# Patient Record
Sex: Male | Born: 1955 | Race: White | Hispanic: No | Marital: Married | Smoking: Never smoker
Health system: Southern US, Community
[De-identification: ages and names within clinical notes are randomized; demographics above are authoritative.]

## PROBLEM LIST (undated history)

## (undated) DIAGNOSIS — Z87442 Personal history of urinary calculi: Secondary | ICD-10-CM

## (undated) DIAGNOSIS — G629 Polyneuropathy, unspecified: Secondary | ICD-10-CM

## (undated) DIAGNOSIS — M1990 Unspecified osteoarthritis, unspecified site: Secondary | ICD-10-CM

## (undated) DIAGNOSIS — L0291 Cutaneous abscess, unspecified: Secondary | ICD-10-CM

## (undated) DIAGNOSIS — I10 Essential (primary) hypertension: Secondary | ICD-10-CM

## (undated) DIAGNOSIS — I2510 Atherosclerotic heart disease of native coronary artery without angina pectoris: Secondary | ICD-10-CM

## (undated) DIAGNOSIS — F419 Anxiety disorder, unspecified: Secondary | ICD-10-CM

## (undated) DIAGNOSIS — K219 Gastro-esophageal reflux disease without esophagitis: Secondary | ICD-10-CM

## (undated) DIAGNOSIS — E782 Mixed hyperlipidemia: Secondary | ICD-10-CM

## (undated) DIAGNOSIS — M609 Myositis, unspecified: Secondary | ICD-10-CM

## (undated) DIAGNOSIS — G8929 Other chronic pain: Secondary | ICD-10-CM

## (undated) DIAGNOSIS — N200 Calculus of kidney: Secondary | ICD-10-CM

## (undated) DIAGNOSIS — Z87898 Personal history of other specified conditions: Secondary | ICD-10-CM

## (undated) DIAGNOSIS — M549 Dorsalgia, unspecified: Secondary | ICD-10-CM

## (undated) DIAGNOSIS — E109 Type 1 diabetes mellitus without complications: Secondary | ICD-10-CM

## (undated) HISTORY — DX: Atherosclerotic heart disease of native coronary artery without angina pectoris: I25.10

## (undated) HISTORY — DX: Type 1 diabetes mellitus without complications: E10.9

## (undated) HISTORY — PX: BACK SURGERY: SHX140

## (undated) HISTORY — PX: CARPAL TUNNEL RELEASE: SHX101

## (undated) HISTORY — PX: OTHER SURGICAL HISTORY: SHX169

## (undated) HISTORY — DX: Polyneuropathy, unspecified: G62.9

## (undated) HISTORY — DX: Mixed hyperlipidemia: E78.2

## (undated) HISTORY — DX: Anxiety disorder, unspecified: F41.9

## (undated) HISTORY — PX: ELBOW SURGERY: SHX618

## (undated) HISTORY — DX: Essential (primary) hypertension: I10

## (undated) HISTORY — DX: Personal history of other specified conditions: Z87.898

## (undated) HISTORY — DX: Myositis, unspecified: M60.9

---

## 2002-10-01 ENCOUNTER — Ambulatory Visit (HOSPITAL_COMMUNITY): Admission: RE | Admit: 2002-10-01 | Discharge: 2002-10-01 | Payer: Self-pay | Admitting: Family Medicine

## 2002-10-01 ENCOUNTER — Encounter: Payer: Self-pay | Admitting: Family Medicine

## 2002-10-23 ENCOUNTER — Ambulatory Visit (HOSPITAL_COMMUNITY): Admission: RE | Admit: 2002-10-23 | Discharge: 2002-10-23 | Payer: Self-pay | Admitting: Neurosurgery

## 2002-10-23 ENCOUNTER — Encounter: Payer: Self-pay | Admitting: Neurosurgery

## 2002-12-29 ENCOUNTER — Ambulatory Visit (HOSPITAL_COMMUNITY): Admission: RE | Admit: 2002-12-29 | Discharge: 2002-12-29 | Payer: Self-pay | Admitting: Neurosurgery

## 2003-08-27 ENCOUNTER — Ambulatory Visit (HOSPITAL_COMMUNITY): Admission: RE | Admit: 2003-08-27 | Discharge: 2003-08-27 | Payer: Self-pay | Admitting: Neurosurgery

## 2004-03-27 ENCOUNTER — Ambulatory Visit (HOSPITAL_COMMUNITY): Admission: RE | Admit: 2004-03-27 | Discharge: 2004-03-27 | Payer: Self-pay | Admitting: Internal Medicine

## 2005-04-06 ENCOUNTER — Ambulatory Visit (HOSPITAL_COMMUNITY): Admission: RE | Admit: 2005-04-06 | Discharge: 2005-04-06 | Payer: Self-pay | Admitting: Neurosurgery

## 2005-07-27 ENCOUNTER — Ambulatory Visit (HOSPITAL_COMMUNITY): Admission: RE | Admit: 2005-07-27 | Discharge: 2005-07-27 | Payer: Self-pay | Admitting: Family Medicine

## 2006-03-05 ENCOUNTER — Ambulatory Visit (HOSPITAL_COMMUNITY): Admission: RE | Admit: 2006-03-05 | Discharge: 2006-03-05 | Payer: Self-pay | Admitting: Neurosurgery

## 2006-07-31 ENCOUNTER — Encounter: Payer: Self-pay | Admitting: Cardiology

## 2007-06-10 ENCOUNTER — Ambulatory Visit (HOSPITAL_COMMUNITY): Admission: RE | Admit: 2007-06-10 | Discharge: 2007-06-10 | Payer: Self-pay | Admitting: General Surgery

## 2007-08-03 ENCOUNTER — Encounter: Admission: RE | Admit: 2007-08-03 | Discharge: 2007-08-03 | Payer: Self-pay | Admitting: Sports Medicine

## 2008-01-15 ENCOUNTER — Ambulatory Visit (HOSPITAL_BASED_OUTPATIENT_CLINIC_OR_DEPARTMENT_OTHER): Admission: RE | Admit: 2008-01-15 | Discharge: 2008-01-15 | Payer: Self-pay | Admitting: Orthopedic Surgery

## 2009-09-30 ENCOUNTER — Ambulatory Visit (HOSPITAL_BASED_OUTPATIENT_CLINIC_OR_DEPARTMENT_OTHER): Admission: RE | Admit: 2009-09-30 | Discharge: 2009-09-30 | Payer: Self-pay | Admitting: General Surgery

## 2010-01-30 ENCOUNTER — Encounter: Payer: Self-pay | Admitting: Sports Medicine

## 2010-03-15 ENCOUNTER — Ambulatory Visit: Payer: Self-pay | Admitting: Cardiovascular Disease

## 2010-03-23 LAB — GLUCOSE, CAPILLARY
Glucose-Capillary: 219 mg/dL — ABNORMAL HIGH (ref 70–99)
Glucose-Capillary: 224 mg/dL — ABNORMAL HIGH (ref 70–99)

## 2010-03-23 LAB — BASIC METABOLIC PANEL
Chloride: 106 mEq/L (ref 96–112)
GFR calc Af Amer: >60 mL/min (ref >60)
Potassium: 4.4 mEq/L (ref 3.5–5.1)
Sodium: 137 mEq/L (ref 135–145)

## 2010-03-23 LAB — POCT HEMOGLOBIN-HEMACUE: Hemoglobin: 14.6 g/dL (ref 13.0–17.0)

## 2010-03-23 LAB — ANAEROBIC CULTURE

## 2010-03-23 LAB — CULTURE, ROUTINE-ABSCESS

## 2010-04-14 ENCOUNTER — Encounter: Payer: Self-pay | Admitting: Cardiology

## 2010-04-19 ENCOUNTER — Encounter: Payer: Self-pay | Admitting: Cardiology

## 2010-04-19 ENCOUNTER — Ambulatory Visit (INDEPENDENT_AMBULATORY_CARE_PROVIDER_SITE_OTHER): Payer: BC Managed Care – PPO | Admitting: Cardiology

## 2010-04-19 DIAGNOSIS — R072 Precordial pain: Secondary | ICD-10-CM

## 2010-04-19 DIAGNOSIS — E785 Hyperlipidemia, unspecified: Secondary | ICD-10-CM

## 2010-04-19 DIAGNOSIS — E119 Type 2 diabetes mellitus without complications: Secondary | ICD-10-CM

## 2010-04-19 DIAGNOSIS — I10 Essential (primary) hypertension: Secondary | ICD-10-CM

## 2010-04-19 DIAGNOSIS — R079 Chest pain, unspecified: Secondary | ICD-10-CM

## 2010-04-19 NOTE — Patient Instructions (Signed)
Your physician has requested that you have en exercise stress myoview. For further information please visit [email or web address]. Please follow instruction sheet, as given.  Your physician recommends that you continue on your current medications as directed. Please refer to the Current Medication list given to you today.  Your physician recommends that you schedule a follow-up appointment in: 3 WEEKS

## 2010-04-24 LAB — GLUCOSE, CAPILLARY: Glucose-Capillary: 169 mg/dL — ABNORMAL HIGH (ref 70–99)

## 2010-04-24 LAB — BASIC METABOLIC PANEL
BUN: 12 mg/dL (ref 6–23)
CO2: 25 mEq/L (ref 19–32)
Glucose, Bld: 186 mg/dL — ABNORMAL HIGH (ref 70–99)
Potassium: 4.3 mEq/L (ref 3.5–5.1)
Sodium: 138 mEq/L (ref 135–145)

## 2010-04-27 ENCOUNTER — Ambulatory Visit (HOSPITAL_COMMUNITY): Payer: BC Managed Care – PPO | Attending: Cardiology | Admitting: Radiology

## 2010-04-27 VITALS — Ht 72.0 in | Wt 258.0 lb

## 2010-04-27 DIAGNOSIS — E119 Type 2 diabetes mellitus without complications: Secondary | ICD-10-CM

## 2010-04-27 DIAGNOSIS — R072 Precordial pain: Secondary | ICD-10-CM | POA: Insufficient documentation

## 2010-04-27 DIAGNOSIS — R0789 Other chest pain: Secondary | ICD-10-CM

## 2010-04-27 DIAGNOSIS — R079 Chest pain, unspecified: Secondary | ICD-10-CM

## 2010-04-27 MED ORDER — TECHNETIUM TC 99M TETROFOSMIN IV KIT
33.0000 | PACK | Freq: Once | INTRAVENOUS | Status: AC | PRN
  Administered 2010-04-27: 33 via INTRAVENOUS

## 2010-04-27 MED ORDER — TECHNETIUM TC 99M TETROFOSMIN IV KIT
10.8000 | PACK | Freq: Once | INTRAVENOUS | Status: AC | PRN
  Administered 2010-04-27: 11 via INTRAVENOUS

## 2010-04-27 NOTE — Progress Notes (Signed)
[facility] SITE 3 NUCLEAR MED [address] [place] [address] [phone]  Cardiology Nuclear Med Study  David Mcdowell is a 55 y.o. male [id number] 04/12/1955   Nuclear Med Background Indication for Stress Test:  Evaluation for Ischemia History:  10 years ago GXT Cardiac Risk Factors: Family History - CAD, Hypertension,, IDDM Type 2 and Lipids  Symptoms:  Chest Pain, Chest Tightness with Exertion (last date of chest discomfort yesterday), DOE and Fatigue   Nuclear Pre-Procedure Caffeine/Decaff Intake:  None NPO After: 8:30pm   Lungs:  clear IV 0.9% NS with Angio Cath:  22g  IV Site: R Hand  IV Started by:  Doyne Keel, CNMT  Chest Size (in):  46 Cup Size: n/a  Height: 6' (1.829 m)  Weight:  258 lb (117.028 kg)  BMI:  Body mass index is 34.99 kg/(m^2). Tech Comments:  Held metformin this am    Nuclear Med Study 1 or 2 day study: 1 day  Stress Test Type:  Stress  Reading MD: Olga Millers, MD  Order Authorizing Provider:  Ermalene Postin, MD  Resting Radionuclide: Technetium 86m Tetrofosmin  Resting Radionuclide Dose: 10.8 mCi   Stress Radionuclide:  Technetium 10m Tetrofosmin  Stress Radionuclide Dose: 33.0 mCi           Stress Protocol Rest HR: 89 Stress HR: 151  Rest BP: 147/74 Stress BP: 189/59  Exercise Time (min): 8:9mins METS: 8.90     % Max HR: 90.42 bpm    Dose of Adenosine (mg):  n/a Dose of Lexiscan: n/a mg  Dose of Atropine (mg): n/a Dose of Dobutamine: n/a mcg/kg/min (at max HR)  Stress Test Technologist: Frederick Peers, EMT-P  Nuclear Technologist:  Doyne Keel, CNMT     Rest Procedure:  Myocardial perfusion imaging was performed at rest 45 minutes following the intravenous administration of Technetium 62m Tetrofosmin. Rest ECG: NSR  Stress Procedure:  The patient exercised for 8:69min.  The patient stopped due to SOB/leg fatigue and denied any chest pain.  There were no significant ST-T wave changes.  Technetium 35m Tetrofosmin  was injected at peak exercise and myocardial perfusion imaging was performed after a brief delay. Stress ECG: No significant ST segment change suggestive of ischemia.  QPS Raw Data Images:  Acquisition technically good; normal left ventricular size. Stress Images:  Normal homogeneous uptake in all areas of the myocardium. Rest Images:  Normal homogeneous uptake in all areas of the myocardium. Subtraction (SDS):  No evidence of ischemia. Transient Ischemic Dilatation (Normal <1.22):  0.91 Lung/Heart Ratio (Normal <0.45):  0.34  Quantitative Gated Spect Images QGS EDV:  85 ml QGS ESV:  36 ml QGS cine images:  Normal Wall Motion QGS EF: 58%  Impression Exercise Capacity:  Fair exercise capacity. BP Response:  Normal blood pressure response. Clinical Symptoms:  No chest pain. ECG Impression:  No significant ST segment change suggestive of ischemia. Comparison with Prior Nuclear Study: No images to compare  Overall Impression:  Normal stress nuclear study.    Olga Millers

## 2010-04-28 NOTE — Progress Notes (Signed)
Routed to Dr.Stuckey.Mirna Mires

## 2010-05-01 ENCOUNTER — Encounter (HOSPITAL_COMMUNITY): Payer: Self-pay | Admitting: Cardiology

## 2010-05-03 NOTE — Progress Notes (Signed)
Pt aware of results by phone.  Pt has upcoming appointment 05/11/10 with Dr Riley Kill.

## 2010-05-04 DIAGNOSIS — E785 Hyperlipidemia, unspecified: Secondary | ICD-10-CM | POA: Insufficient documentation

## 2010-05-04 DIAGNOSIS — I10 Essential (primary) hypertension: Secondary | ICD-10-CM | POA: Insufficient documentation

## 2010-05-04 DIAGNOSIS — R072 Precordial pain: Secondary | ICD-10-CM | POA: Insufficient documentation

## 2010-05-04 DIAGNOSIS — E1140 Type 2 diabetes mellitus with diabetic neuropathy, unspecified: Secondary | ICD-10-CM | POA: Insufficient documentation

## 2010-05-04 NOTE — Assessment & Plan Note (Addendum)
His hypertension is currently controlled on medical therapy.  Today it is elevated, but in fact it has been followed by Dr. Cherylin Mylar again, he is appropriately on an ACE inhibitor.

## 2010-05-04 NOTE — Assessment & Plan Note (Signed)
Is currently on a generic statin as is appropriate given his DM.

## 2010-05-04 NOTE — Progress Notes (Signed)
HPI:  David Mcdowell is a pleasant gentleman of 55 years of age referred by Dr. Phillips Odor.  He knows Chief Financial Officer.  He has a history of 24-25 years of diabetes mellitus, and is on insulin.  The patient has a neuropathy, and hurts all of the time.  He has had a sharp pain in the center of his chest, which is knife like and not associated with shortness of breath.  It can occur after but not with stress. It does occur mostly after exertion.  Nortably, he notes a dull pain down the left arm.  These symptoms have occurred for about 20-30 minutes at a time.  He does hurt in some way all of the time. He uses an insulin pump.  When he walks on hard surfaces, he will get some hand pain.  Most of the symptoms have started within the last thirty days.  He sees Dr. Lurene Shadow for endocrinology. His father died at age 21 with a cardiac event, with first MIs earlier on in the course of his events.  Current Outpatient Prescriptions  Medication Sig Dispense Refill  . ALPRAZolam (XANAX) 0.5 MG tablet Take 0.5 mg by mouth 2 (two) times daily.        Marland Kitchen amoxicillin-clavulanate (AUGMENTIN) 875-125 MG per tablet Take 1 tablet by mouth 2 (two) times daily. Prn for flare       . aspirin 81 MG tablet Take 81 mg by mouth daily.        . cyclobenzaprine (FLEXERIL) 10 MG tablet Take 10 mg by mouth 3 (three) times daily as needed.        . diazepam (VALIUM) 5 MG tablet Take 5 mg by mouth every 6 (six) hours as needed.        Marland Kitchen HYDROcodone-acetaminophen (VICODIN) 5-500 MG per tablet Take 1 tablet by mouth every 6 (six) hours as needed.        . INSULIN REGULAR HUMAN (HUMULIN R) 500 UNIT/ML SOLN Inject into the skin. Sliding scale       . lisinopril (PRINIVIL,ZESTRIL) 40 MG tablet Take 40 mg by mouth daily.        . metFORMIN (GLUCOPHAGE) 1000 MG tablet Take 1,000 mg by mouth daily.        . nabumetone (RELAFEN) 500 MG tablet Take 500 mg by mouth as needed.        . simvastatin (ZOCOR) 40 MG tablet Take 40 mg by mouth at bedtime.        .  ciprofloxacin (CIPRO) 500 MG tablet Take 500 mg by mouth. Prn for flare         No Known Allergies  Past Medical History  Diagnosis Date  . Diabetes mellitus     type II  . Hypertension   . Chest tightness   . Hyperlipidemia   . Neuropathy   . Myositis   . Anxiety     Past Surgical History  Procedure Date  . Carpal tunnel release   . Back surgery     Family History  Problem Relation Age of Onset  . Coronary artery disease      family hx of    History   Social History  . Marital Status: Married    Spouse Name: N/A    Number of Children: N/A  . Years of Education: N/A   Occupational History  . Not on file.   Social History Main Topics  . Smoking status: Never Smoker   . Smokeless tobacco: Not on file  .  Alcohol Use: No  . Drug Use: No  . Sexually Active: Not on file   Other Topics Concern  . Not on file   Social History Narrative  . No narrative on file    ROS: No current issues with hearing, visual.  No GI complaints.  Complete ROS otherwise negative.   PHYSICAL EXAM:  BP 153/91  Pulse 88  Ht 6' (1.829 m)  Wt 259 lb 14.4 oz (117.89 kg)  BMI 35.25 kg/m2  General: Well developed, well nourished, in no acute distress. Head:  Normocephalic and atraumatic. Neck: no JVD Lungs: Clear to auscultation and percussion. Heart: Normal S1 and S2.  No murmur, rubs or gallops.  Abdomen:  Normal bowel sounds; soft; non tender; no organomegaly Pulses: Pulses normal in all 4 extremities. Extremities: No clubbing or cyanosis. No edema. Neurologic: Alert and oriented x 3.  EKG:  NSR. WNL.  ASSESSMENT AND PLAN:

## 2010-05-04 NOTE — Assessment & Plan Note (Addendum)
Patient has symptoms with central pain, but it is atypical to some extent in nature.  He may in fact require cath, but I think at this point it seems reasonable to do a stress myoview to assess exercise tolerance, and myocardial perfusion in light of results of Bari 2D.  If it is high risk, we might consider a diagnostic cath study.  Should he have any progression before then, he should contact us promptly.  Given his long standing diabetes requiring insulin, he likely has coronary atherosclerosis.  This may or may not be source of symptoms.  We would also eventually need a CXR.

## 2010-05-04 NOTE — Assessment & Plan Note (Signed)
Has an insulin pump and is nicely controlled at the present time.  He does not smoke, and seems to take his disease seriously. Has neuropathy by history.

## 2010-05-11 ENCOUNTER — Ambulatory Visit (INDEPENDENT_AMBULATORY_CARE_PROVIDER_SITE_OTHER): Payer: BC Managed Care – PPO | Admitting: Cardiology

## 2010-05-11 ENCOUNTER — Encounter: Payer: Self-pay | Admitting: Cardiology

## 2010-05-11 DIAGNOSIS — R079 Chest pain, unspecified: Secondary | ICD-10-CM

## 2010-05-11 DIAGNOSIS — E785 Hyperlipidemia, unspecified: Secondary | ICD-10-CM

## 2010-05-11 NOTE — Patient Instructions (Addendum)
Your physician recommends that you schedule a follow-up appointment in: 6 WEEKS  Your physician recommends that you continue on your current medications as directed. Please refer to the Current Medication list given to you today.  

## 2010-05-11 NOTE — Progress Notes (Signed)
HPI:  Patient came in today to review his stress test.  He had some mod shortness of breath with 8 minutes.  There was no perfusion defect, and no obvious ischemia.  There was not ST depression.  He is under tremendous stress as his daughter in law is living with them, and moving out today to separate from the son who is in the [employer] in [place].  The patient has walked a bit more, and we discussed this at length.    Current Outpatient Prescriptions  Medication Sig Dispense Refill  . ALPRAZolam (XANAX) 0.5 MG tablet Take 0.5 mg by mouth at bedtime as needed.       Marland Kitchen amoxicillin-clavulanate (AUGMENTIN) 875-125 MG per tablet Take 1 tablet by mouth 2 (two) times daily. Prn for flare       . aspirin 81 MG tablet Take 81 mg by mouth daily.        . ciprofloxacin (CIPRO) 500 MG tablet Take 500 mg by mouth. Prn for flare       . cyclobenzaprine (FLEXERIL) 10 MG tablet Take 10 mg by mouth 3 (three) times daily as needed.        . diazepam (VALIUM) 5 MG tablet Take 5 mg by mouth every 6 (six) hours as needed.        Marland Kitchen HYDROcodone-acetaminophen (VICODIN) 5-500 MG per tablet Take 1 tablet by mouth every 6 (six) hours as needed.        . INSULIN REGULAR HUMAN (HUMULIN R) 500 UNIT/ML SOLN Inject into the skin. Sliding scale       . lisinopril (PRINIVIL,ZESTRIL) 40 MG tablet Take 40 mg by mouth daily.        . metFORMIN (GLUCOPHAGE) 1000 MG tablet Take 1,000 mg by mouth daily.        . nabumetone (RELAFEN) 500 MG tablet Take 500 mg by mouth as needed.        . simvastatin (ZOCOR) 40 MG tablet Take 40 mg by mouth at bedtime.          No Known Allergies  Past Medical History  Diagnosis Date  . Diabetes mellitus     type II  . Hypertension   . Chest tightness   . Hyperlipidemia   . Neuropathy   . Myositis   . Anxiety     Past Surgical History  Procedure Date  . Carpal tunnel release   . Back surgery     Family History  Problem Relation Age of Onset  . Coronary artery disease      family hx  of    History   Social History  . Marital Status: Married    Spouse Name: N/A    Number of Children: N/A  . Years of Education: N/A   Occupational History  . Not on file.   Social History Main Topics  . Smoking status: Never Smoker   . Smokeless tobacco: Not on file  . Alcohol Use: No  . Drug Use: No  . Sexually Active: Not on file   Other Topics Concern  . Not on file   Social History Narrative  . No narrative on file    ROS: Please see the HPI.  All other systems reviewed and negative.  PHYSICAL EXAM:  BP 140/80  Pulse 96  Resp 18  Ht 6' (1.829 m)  Wt 259 lb 1.9 oz (117.536 kg)  BMI 35.14 kg/m2  General: Well developed, well nourished, in no acute distress. Head:  Normocephalic and  atraumatic. Neck: no JVD Lungs: Clear to auscultation and percussion. Heart: Normal S1 and S2.  No murmur, rubs or gallops.  Pulses: Pulses normal in all 4 extremities. Extremities: No clubbing or cyanosis. No edema. Neurologic: Alert and oriented x 3.  EKG:  See stress test report.    ASSESSMENT AND PLAN:

## 2010-05-11 NOTE — Assessment & Plan Note (Signed)
See stress report. We discussed this situation in detail.  He will exercise in a structured fashion.  He will see if his exercise tolerance improves.  We will have a low threshold for recommending heart catheterization if any progression of symptoms.

## 2010-05-11 NOTE — Assessment & Plan Note (Signed)
Discussed aiming toward targets in LDL and HgbA1c.

## 2010-05-23 NOTE — Op Note (Signed)
David Mcdowell, David Mcdowell               ACCOUNT NO.:  [id number]   MEDICAL RECORD NO.:  [id number]          PATIENT TYPE:  AMB   LOCATION:  DSC                          FACILITY:  [facility]   PHYSICIAN:  Loreta Ave, M.D. DATE OF BIRTH:  08/25/1955   DATE OF PROCEDURE:  01/15/2008  DATE OF DISCHARGE:                               OPERATIVE REPORT   PREOPERATIVE DIAGNOSIS:  Lateral epicondylitis with tearing of extensor  carpi radialis brevis tendon left elbow.   POSTOPERATIVE DIAGNOSIS:  Lateral epicondylitis with tearing of extensor  carpi radialis brevis tendon left elbow.   PROCEDURE:  Left elbow exploration and debridement of the extensor carpi  radialis brevis tendon.  Epicondylar debridement and drilling.  Repair  of superficial extensors elbow defect.   SURGEON:  Loreta Ave, MD   ASSISTANT:  Zonia Kief, PA   ANESTHESIA:  General.   BLOOD LOSS:  Minimal.   SPECIMEN:  None.   CULTURES:  None.   COMPLICATIONS:  None.   PROCEDURE:  Soft compressive with a sugar-tong splint.   TOURNIQUET TIME:  45 minutes.   PROCEDURE:  The patient was brought to the operating room and after  adequate anesthesia had been obtained, left elbow examined.  Full motion  and stable elbow.  Tourniquet applied and prepped and draped in the  usual sterile fashion.  Exsanguination and elevation with an Esmarch  tourniquet inflated to 250 mmHg.  Incision from the epicondyle distally.  Skin and subcutaneous tissue divided.  Superficial extensors intact.  Divided longitudinally.  Mucinous degeneration and partial tearing of  the ECRB tendon from the epicondyle down almost to the radial head.  Underlying capsule, abnormal tissue and tendon all debrided back to  healthy tissue.  The elbow exposed and examined.  No chondromalacia.  Epicondyle debrided free with multiple drilling.  Wound irrigated.  Superficial extensors were then brought back over top of the defect and  over the epicondyle and  sutured with a running Vicryl  suture.  Wound irrigated and closed with subcutaneous and subcuticular  Vicryl.  Sterile compressive dressing applied.  Tourniquet deflated and  removed.  Sugar-tong splint applied.  Anesthesia reversed.  Brought to  the recovery room.  Tolerated the surgery well.  No complications.      Loreta Ave, M.D.  Electronically Signed     DFM/MEDQ  D:  01/15/2008  T:  01/16/2008  Job:  [id number]

## 2010-05-26 NOTE — Op Note (Signed)
NAME:  David Mcdowell, David Mcdowell                         ACCOUNT NO.:  [id number]   MEDICAL RECORD NO.:  [id number]                   PATIENT TYPE:  OIB   LOCATION:  3172                                 FACILITY:  [facility]   PHYSICIAN:  Hilda Lias, M.D.                DATE OF BIRTH:  09-Nov-1955   DATE OF PROCEDURE:  12/29/2002  DATE OF DISCHARGE:  12/29/2002                                 OPERATIVE REPORT   PREOPERATIVE DIAGNOSIS:  Right carpal tunnel syndrome.   POSTOPERATIVE DIAGNOSIS:  Right carpal tunnel syndrome.   OPERATION PERFORMED:  Decompression of the right median nerve.   SURGEON:  Hilda Lias, M.D.   ANESTHESIA:  IV plus local.   INDICATIONS FOR PROCEDURE:  [name] underwent decompression of the left  median nerve several weeks ago.  Now he feels that the right hand is getting  worse.  Nerve conduction velocity was positive for carpal tunnel syndrome.  The risks were explained including infection, no improvement whatsoever,  need for further surgery.   DESCRIPTION OF PROCEDURE:  The patient was taken to the operating room and  the right hand and arm was prepped with Betadine.  Drapes were applied.  Infiltration along the base of the thumb was made.  Then incision from this  base of the thumb was made through the skin, volar ligament and through a  thick carpal ligament.  Decompression proximal and distal was done along the  ulnar aspect of the nerve.  At the end we had good decompression with plenty  of space. Resection of part of the carpal ligament was also achieved.  Then  the area was irrigated.  Hemostasis was done with bipolar.  The patient was  able to move the fingers including the thumb.  From then on the wound was  closed with nylon.                                               Hilda Lias, M.D.    EB/MEDQ  D:  12/29/2002  T:  12/29/2002  Job:  [id number]

## 2010-05-26 NOTE — Procedures (Signed)
NAME:  DEMBA, NIGH               ACCOUNT NO.:  [id number]   MEDICAL RECORD NO.:  [id number]          PATIENT TYPE:  OUT   LOCATION:  RESP                          FACILITY:  [facility]   PHYSICIAN:  Edward L. Juanetta Gosling, M.D.DATE OF BIRTH:  1955/09/25   DATE OF PROCEDURE:  07/27/2005  DATE OF DISCHARGE:  07/27/2005                              PULMONARY FUNCTION TEST   Spirometry shows a mild ventilatory defect without definite airflow  obstruction except at the level of the smaller airways.  This could indicate  mild asthma or other mild air flow obstructive disease.      Edward L. Juanetta Gosling, M.D.  Electronically Signed     ELH/MEDQ  D:  07/31/2005  T:  08/01/2005  Job:  [id number]

## 2010-05-26 NOTE — H&P (Signed)
NAME:  David Mcdowell, David Mcdowell               ACCOUNT NO.:  [id number]   MEDICAL RECORD NO.:  [id number]           PATIENT TYPE:  AMB   LOCATION:  DAY                           FACILITY:  [facility]   PHYSICIAN:  Dalia Heading, M.D.  DATE OF BIRTH:  1955/05/03   DATE OF ADMISSION:  DATE OF DISCHARGE:  LH                              HISTORY & PHYSICAL   CHIEF COMPLAINT:  Need for screening colonoscopy.   HISTORY OF PRESENT ILLNESS:  The patient is a 55 year old white male who  is referred for an endoscopic evaluation.  He needs a colonoscopy for  screening purposes.  No abdominal pain, weight loss, nausea, vomiting,  diarrhea, constipation, melena, or hematochezia have been noted.  He has  never had a colonoscopy.  There is no family history of colon carcinoma.   PAST MEDICAL HISTORY:  Includes insulin-dependent diabetes mellitus,  hypertension.   PAST SURGICAL HISTORY:  Carpal tunnel release, back surgery.   CURRENT MEDICATIONS:  NovoLog, Zocor, Vicodin, Prinivil, Prilosec,  Relafen.   ALLERGIES:  No known drug allergies.   REVIEW OF SYSTEMS:  Noncontributory.   On physical examination, the patient is a well-developed, well-nourished  white male in no acute distress.  LUNGS:  Clear to auscultation with equal breath sounds bilaterally.  HEART:  Examination reveals regular rate and rhythm without S3, S4, or  murmurs.  ABDOMEN:  Soft, nontender, nondistended.  No hepatosplenomegaly or  masses are noted.  RECTAL:  Deferred to the procedure.   IMPRESSION:  Need for screening colonoscopy.   PLAN:  The patient is scheduled for a colonoscopy on June 10, 2007.  The  risks and benefits of the procedure, including bleeding and perforation,  were fully explained to the patient, gave informed consent.      Dalia Heading, M.D.  Electronically Signed     MAJ/MEDQ  D:  05/13/2007  T:  05/14/2007  Job:  [id number]   cc:   Patrica Duel, M.D.  Fax: [phone]

## 2010-05-26 NOTE — Op Note (Signed)
   NAME:  David Mcdowell, David Mcdowell                         ACCOUNT NO.:  [id number]   MEDICAL RECORD NO.:  [id number]                   PATIENT TYPE:  OIB   LOCATION:  2895                                 FACILITY:  [facility]   PHYSICIAN:  Hilda Lias, M.D.                DATE OF BIRTH:  04-13-1955   DATE OF PROCEDURE:  10/23/2002  DATE OF DISCHARGE:  10/23/2002                                 OPERATIVE REPORT   PREOPERATIVE DIAGNOSIS:  Bilateral carpal tunnel syndrome.   POSTOPERATIVE DIAGNOSIS:  Bilateral carpal tunnel syndrome.   PROCEDURE:  Decompression of the left median nerve.   SURGEON:  Hilda Lias, M.D.   ANESTHESIA:  IV sedation plus local.   CLINICAL HISTORY:  The patient was admitted because of pain of both hands  associated with weakness of both thenar muscles.  Nerve conduction  velocities show severe carpal tunnel syndrome, left worse than the right  one.  The patient wanted to go ahead with the procedure.  He knew about the  risks such as infection, no improvement whatsoever, scar tissue, bleeding,  need for further surgery.   DESCRIPTION OF PROCEDURE:  The patient was taken to the OR and after IV  sedation, the hand was prepped with Betadine.  Infiltration with Xylocaine  1% was made.  Then incision along the base of the thumb was made through the  skin, volar ligament, and through a thick carpal ligament.  Decompression  proximal along the ulnar aspect of the nerve was achieved.  Decompression  distally was done.  Hemostasis was done with bipolar.  We found that the  carpal ligament was thick and calcified.  Having good decompression, the  area was irrigated, hemostasis was done with the bipolar, and the wound was  closed using nylon.  The patient did well and at the end of the procedure  was able to move all the fingers, including the thumb.                                               Hilda Lias, M.D.    EB/MEDQ  D:  10/23/2002  T:  10/24/2002  Job:   [id number]

## 2010-05-26 NOTE — Op Note (Signed)
NAME:  David Mcdowell, David Mcdowell                         ACCOUNT NO.:  [id number]   MEDICAL RECORD NO.:  [id number]                   PATIENT TYPE:  OIB   LOCATION:  2899                                 FACILITY:  [facility]   PHYSICIAN:  Hilda Lias, M.D.                DATE OF BIRTH:  [birth year]   DATE OF PROCEDURE:  08/27/2003  DATE OF DISCHARGE:                                 OPERATIVE REPORT   PREOPERATIVE DIAGNOSIS:  Recurrent right carpal tunnel syndrome.   POSTOPERATIVE DIAGNOSIS:  Recurrent right carpal tunnel syndrome.   PROCEDURE:  Decompression of the right median nerve, lysis of adhesions,  infiltration of the subcutaneous tissue with Solu-Medrol.   SURGEON:  Hilda Lias, M.D.   CLINICAL HISTORY:  [name] is a gentleman who underwent bilateral carpal  tunnel syndrome surgery.  Lately the patient has been complaining of pain in  both hands, right worse than the left one.  Tests showed that indeed he had  recurrent carpal tunnel syndrome in the right side.  By palpation he has a  thick, hard subcutaneous space.  Surgery was advised to decompress the  median nerve.  The risks, of course, were recurrence of the compression of  the median nerve, infection.   PROCEDURE:  The patient was taken to the OR, and the right hand was prepped  with Betadine.  Infiltration with Xylocaine was made.  IV sedation was  given.  Incision for the previous one was made through the skin through a  thick scar tissue.  We were able to see the median nerve, which was distally  at the level of the wrist, which was clean, but from then on it was adherent  to the subcutaneous tissue.  We followed the median nerve all the way  distally, doing lysis of adhesions to decompress the median nerve  completely.  Decompression proximally and distally was achieved.  Hemostasis  was done with bipolar.  The area was irrigated and infiltration of the  subcutaneous tissue with Solu-Medrol was made.  Having done this,  the wound  was closed with nylon.                                               Hilda Lias, M.D.    EB/MEDQ  D:  08/27/2003  T:  08/28/2003  Job:  [id number]

## 2010-06-29 ENCOUNTER — Encounter: Payer: Self-pay | Admitting: Cardiology

## 2010-06-29 ENCOUNTER — Ambulatory Visit (INDEPENDENT_AMBULATORY_CARE_PROVIDER_SITE_OTHER): Payer: Medicare Other | Admitting: Cardiology

## 2010-06-29 DIAGNOSIS — R079 Chest pain, unspecified: Secondary | ICD-10-CM

## 2010-06-29 DIAGNOSIS — I10 Essential (primary) hypertension: Secondary | ICD-10-CM

## 2010-06-29 NOTE — Patient Instructions (Signed)
Your physician recommends that you schedule a follow-up appointment in: 2 months with Dr. Riley Kill.

## 2010-07-10 ENCOUNTER — Encounter: Payer: Self-pay | Admitting: Cardiology

## 2010-07-10 NOTE — Progress Notes (Signed)
HPI:  His disability came through and he feels as though a tremendous stress has been lifted off of his shoulders.  Denies any chest pain.  He does have some ortho issues which limit some of his exercise, but seems to be doing better.  No major symptoms at this point in time.    Current Outpatient Prescriptions  Medication Sig Dispense Refill  . ALPRAZolam (XANAX) 0.5 MG tablet Take 0.5 mg by mouth at bedtime as needed.       Marland Kitchen amoxicillin-clavulanate (AUGMENTIN) 875-125 MG per tablet Take 1 tablet by mouth 2 (two) times daily. Prn for flare       . aspirin 81 MG tablet Take 81 mg by mouth daily.        . ciprofloxacin (CIPRO) 500 MG tablet Take 500 mg by mouth. Prn for flare       . cyclobenzaprine (FLEXERIL) 10 MG tablet Take 10 mg by mouth 3 (three) times daily as needed.        . diazepam (VALIUM) 5 MG tablet Take 5 mg by mouth every 6 (six) hours as needed.        Marland Kitchen HYDROcodone-acetaminophen (VICODIN) 5-500 MG per tablet Take 1 tablet by mouth every 6 (six) hours as needed.        . INSULIN REGULAR HUMAN (HUMULIN R) 500 UNIT/ML SOLN Inject into the skin. Sliding scale       . lisinopril (PRINIVIL,ZESTRIL) 40 MG tablet Take 40 mg by mouth daily.        . metFORMIN (GLUCOPHAGE) 1000 MG tablet Take 1,000 mg by mouth daily.        . nabumetone (RELAFEN) 500 MG tablet Take 500 mg by mouth as needed.        . simvastatin (ZOCOR) 40 MG tablet Take 40 mg by mouth at bedtime.          No Known Allergies  Past Medical History  Diagnosis Date  . Diabetes mellitus     type II  . Hypertension   . Chest tightness   . Hyperlipidemia   . Neuropathy   . Myositis   . Anxiety     Past Surgical History  Procedure Date  . Carpal tunnel release   . Back surgery     Family History  Problem Relation Age of Onset  . Coronary artery disease      family hx of    History   Social History  . Marital Status: Married    Spouse Name: N/A    Number of Children: N/A  . Years of Education: N/A    Occupational History  . Not on file.   Social History Main Topics  . Smoking status: Never Smoker   . Smokeless tobacco: Not on file  . Alcohol Use: No  . Drug Use: No  . Sexually Active: Not on file   Other Topics Concern  . Not on file   Social History Narrative  . No narrative on file    ROS: Please see the HPI.  All other systems reviewed and negative.  PHYSICAL EXAM:  BP 143/91  Pulse 105  Resp 18  Ht 6' (1.829 m)  Wt 264 lb 1.9 oz (119.804 kg)  BMI 35.82 kg/m2  General: Well developed, well nourished, in no acute distress. Head:  Normocephalic and atraumatic. Neck: no JVD Lungs: Clear to auscultation and percussion. Heart: Normal S1 and S2.  No murmur, rubs or gallops.  Pulses: Pulses normal in all 4 extremities.  Extremities: No clubbing or cyanosis. No edema. Neurologic: Alert and oriented x 3.  EKG:  ASSESSMENT AND PLAN:

## 2010-07-10 NOTE — Assessment & Plan Note (Signed)
He has not had much chest pain.  There are a lot of stressors in his life, and his disability was a big thing.  He appears and sounds relieved.  We have extensively reviewed diabetic CAD data, and he understands to report any new symptoms.  We will continue to follow him over time.

## 2010-07-10 NOTE — Assessment & Plan Note (Signed)
He remains under control.   Again today his current numbers are borderline, but Dr.Golding is following, and doing a nice job.  Appropriately on ACE inhibition.

## 2010-08-21 ENCOUNTER — Telehealth: Payer: Self-pay | Admitting: *Deleted

## 2010-08-21 ENCOUNTER — Encounter: Payer: Self-pay | Admitting: Cardiology

## 2010-08-21 ENCOUNTER — Ambulatory Visit (INDEPENDENT_AMBULATORY_CARE_PROVIDER_SITE_OTHER): Payer: Medicare Other | Admitting: Cardiology

## 2010-08-21 VITALS — BP 179/86 | HR 102 | Ht 72.0 in | Wt 267.0 lb

## 2010-08-21 DIAGNOSIS — I10 Essential (primary) hypertension: Secondary | ICD-10-CM

## 2010-08-21 DIAGNOSIS — R079 Chest pain, unspecified: Secondary | ICD-10-CM

## 2010-08-21 DIAGNOSIS — E785 Hyperlipidemia, unspecified: Secondary | ICD-10-CM

## 2010-08-21 NOTE — Progress Notes (Signed)
HPI:  Patient is having brief, sharp chest pain.  It lasts seconds.  He is also having severe arm pain related to a tendon tear, and his BP is up quite a bit.  This has been bothering him greatly and he is going to check with Dr. Margaretha Sheffield today.  He also is having some indigestion.  No progression.  He said his BP was down the other day. Current Outpatient Prescriptions  Medication Sig Dispense Refill  . ALPRAZolam (XANAX) 0.5 MG tablet Take 0.5 mg by mouth at bedtime as needed.       Marland Kitchen aspirin 81 MG tablet Take 81 mg by mouth daily.        . diazepam (VALIUM) 5 MG tablet Take 5 mg by mouth every 6 (six) hours as needed.        Marland Kitchen HYDROcodone-acetaminophen (VICODIN) 5-500 MG per tablet Take 1 tablet by mouth every 6 (six) hours as needed.        . INSULIN REGULAR HUMAN (HUMULIN R) 500 UNIT/ML SOLN Inject into the skin. Sliding scale       . lisinopril (PRINIVIL,ZESTRIL) 40 MG tablet Take 40 mg by mouth daily.        . metFORMIN (GLUCOPHAGE) 1000 MG tablet Take 1,000 mg by mouth daily.        . nabumetone (RELAFEN) 500 MG tablet Take 500 mg by mouth as needed.        . simvastatin (ZOCOR) 40 MG tablet Take 40 mg by mouth at bedtime.        Marland Kitchen zolpidem (AMBIEN) 10 MG tablet Take 10 mg by mouth at bedtime as needed.        Marland Kitchen amoxicillin-clavulanate (AUGMENTIN) 875-125 MG per tablet Take 1 tablet by mouth 2 (two) times daily. Prn for flare       . ciprofloxacin (CIPRO) 500 MG tablet Take 500 mg by mouth. Prn for flare       . cyclobenzaprine (FLEXERIL) 10 MG tablet Take 10 mg by mouth 3 (three) times daily as needed.          No Known Allergies  Past Medical History  Diagnosis Date  . Diabetes mellitus     type II  . Hypertension   . Chest tightness   . Hyperlipidemia   . Neuropathy   . Myositis   . Anxiety     Past Surgical History  Procedure Date  . Carpal tunnel release   . Back surgery     Family History  Problem Relation Age of Onset  . Coronary artery disease      family hx of      History   Social History  . Marital Status: Married    Spouse Name: N/A    Number of Children: N/A  . Years of Education: N/A   Occupational History  . Not on file.   Social History Main Topics  . Smoking status: Never Smoker   . Smokeless tobacco: Not on file  . Alcohol Use: No  . Drug Use: No  . Sexually Active: Not on file   Other Topics Concern  . Not on file   Social History Narrative  . No narrative on file    ROS: Please see the HPI.  All other systems reviewed and negative.  PHYSICAL EXAM:  BP 179/86  Pulse 102  Ht 6' (1.829 m)  Wt 267 lb (121.11 kg)  BMI 36.21 kg/m2  General: Well developed, well nourished, in no acute distress. Head:  Normocephalic and atraumatic. Neck: no JVD Lungs: Clear to auscultation and percussion. Heart: Normal S1 and S2.  No murmur, rubs or gallops.  Abdomen:  Normal bowel sounds; soft; non tender; no organomegaly Pulses: Pulses normal in all 4 extremities. Extremities: No clubbing or cyanosis. No edema. Neurologic: Alert and oriented x 3.  EKG:  NSR.  Nonspecific T flattening.   ASSESSMENT AND PLAN:

## 2010-08-21 NOTE — Patient Instructions (Signed)
Your physician recommends that you schedule a follow-up appointment in: 3 weeks with DR [facility] Your physician recommends that you continue on your current medications as directed. Please refer to the Current Medication list given to you today.

## 2010-08-21 NOTE — Telephone Encounter (Signed)
Received call from pt requesting prescription for blood pressure cuff be sent to [facility] in [place]. He was instructed by Dr. Riley Kill at office visit today to monitor blood pressure at home.  I told pt I would call it in for him. I called the order for blood pressure cuff with directions to monitor daily to [facility] in [place].

## 2010-08-22 ENCOUNTER — Other Ambulatory Visit: Payer: Self-pay | Admitting: Sports Medicine

## 2010-08-22 DIAGNOSIS — M25521 Pain in right elbow: Secondary | ICD-10-CM

## 2010-08-24 NOTE — Assessment & Plan Note (Signed)
I discussed his BP with him in detail  He says it goes up with pain of any type.  He has a partial ligament tear, and got a shot for pain, and is going back.  I strongly encouraged him to buy a BP cuff, record pressures, and allow [place] to make treatment decisions based upon this data instead of assuming things.

## 2010-08-24 NOTE — Assessment & Plan Note (Signed)
His pain is clearly atypical.  It is sharp, lasts a split second.  Nothing lasts at all.  However, his likelihood of CAD is extremely high.  The current symptoms likely are not related to CAD, but as noted, there is a high likelihood that CAD is present.  He is to call if his symptoms change.  We will see him back in three weeks, and have a very low threshold for consideration of angiographic evaluation.

## 2010-08-24 NOTE — Assessment & Plan Note (Signed)
Curently followed by Dr. Phillips Odor.  Patient is on a statin.

## 2010-08-27 ENCOUNTER — Ambulatory Visit
Admission: RE | Admit: 2010-08-27 | Discharge: 2010-08-27 | Disposition: A | Discharge status: Home / Self Care | Payer: Medicare Other | Source: Ambulatory Visit | Attending: Sports Medicine | Admitting: Sports Medicine

## 2010-08-27 DIAGNOSIS — M25521 Pain in right elbow: Secondary | ICD-10-CM

## 2010-08-31 ENCOUNTER — Ambulatory Visit (HOSPITAL_BASED_OUTPATIENT_CLINIC_OR_DEPARTMENT_OTHER)
Admission: RE | Admit: 2010-08-31 | Discharge: 2010-08-31 | Disposition: A | Discharge status: Home / Self Care | Payer: Medicare Other | Source: Ambulatory Visit | Attending: Orthopedic Surgery | Admitting: Orthopedic Surgery

## 2010-08-31 DIAGNOSIS — S53499A Other sprain of unspecified elbow, initial encounter: Secondary | ICD-10-CM | POA: Insufficient documentation

## 2010-08-31 DIAGNOSIS — S53146A Lateral dislocation of unspecified ulnohumeral joint, initial encounter: Secondary | ICD-10-CM | POA: Insufficient documentation

## 2010-08-31 DIAGNOSIS — X58XXXA Exposure to other specified factors, initial encounter: Secondary | ICD-10-CM | POA: Insufficient documentation

## 2010-08-31 DIAGNOSIS — Y929 Unspecified place or not applicable: Secondary | ICD-10-CM | POA: Insufficient documentation

## 2010-08-31 LAB — POCT I-STAT, CHEM 8
BUN: 17 mg/dL (ref 6–23)
Calcium, Ion: 1.22 mmol/L (ref 1.12–1.32)
Chloride: 105 mEq/L (ref 96–112)
HCT: 50 % (ref 39.0–52.0)
Potassium: 4.6 mEq/L (ref 3.5–5.1)
Sodium: 141 mEq/L (ref 135–145)

## 2010-08-31 LAB — GLUCOSE, CAPILLARY
Glucose-Capillary: 207 mg/dL — ABNORMAL HIGH (ref 70–99)
Glucose-Capillary: 173 mg/dL — ABNORMAL HIGH (ref 70–99)

## 2010-09-13 ENCOUNTER — Ambulatory Visit (INDEPENDENT_AMBULATORY_CARE_PROVIDER_SITE_OTHER): Payer: Medicare Other | Admitting: Cardiology

## 2010-09-13 ENCOUNTER — Encounter: Payer: Self-pay | Admitting: Cardiology

## 2010-09-13 VITALS — BP 140/80 | HR 112 | Ht 72.0 in | Wt 263.0 lb

## 2010-09-13 DIAGNOSIS — R079 Chest pain, unspecified: Secondary | ICD-10-CM

## 2010-09-13 DIAGNOSIS — I10 Essential (primary) hypertension: Secondary | ICD-10-CM

## 2010-09-13 DIAGNOSIS — E785 Hyperlipidemia, unspecified: Secondary | ICD-10-CM

## 2010-09-13 MED ORDER — AMLODIPINE BESYLATE 5 MG PO TABS: 5.0000 mg | ORAL_TABLET | Freq: Every day | ORAL | Status: DC

## 2010-09-13 NOTE — Patient Instructions (Signed)
Your physician wants you to follow-up in:  6 months. You will receive a reminder letter in the mail two months in advance. If you don't receive a letter, please call our office to schedule the follow-up appointment.   

## 2010-09-19 NOTE — Op Note (Signed)
  [name], David Mcdowell NO.:  [id number]  MEDICAL RECORD NO.:  [id number]  LOCATION:                                 FACILITY:  PHYSICIAN:  Loreta Ave, M.D. DATE OF BIRTH:  1955-10-17  DATE OF PROCEDURE:  08/31/2010 DATE OF DISCHARGE:                              OPERATIVE REPORT   PREOPERATIVE DIAGNOSIS:  Traumatic evulsion, common extensor tendon, lateral aspect, right elbow.  POSTOPERATIVE DIAGNOSIS:  Traumatic evulsion, common extensor tendon, lateral aspect, right elbow.  PROCEDURE:  Right elbow lateral exploration with primary repair of common extensor tendon to the epicondyle with a 3-mm bioabsorbable anchor and FiberWire suture.  SURGEON:  Loreta Ave, MD  ASSISTANT:  Laural Benes. Su Hilt, [place], present throughout the entire case.  ANESTHESIA:  General.  ESTIMATED BLOOD LOSS:  Minimal.  SPECIMEN:  None.  COUNTS:  None.  COMPLICATIONS:  None.  DRESSING:  Soft compressive, long-arm splint.  DESCRIPTION OF PROCEDURE:  The patient was brought to the operating room and placed on the operating table in supine position.  After adequate anesthesia had been obtained, elbow examined.  Full motion stable level. Tourniquet applied.  Prepped and draped in usual sterile fashion. Exsanguinated with elevation and Esmarch.  Tourniquet inflated to 250 mmHg.  Straight incision from the epicondyle distally.  Skin and subcutaneous tissue divided.  Common extensor tendon torn off the epicondyle almost entirely from anterior to posterior.  Retracted 4-5 mm.  This was debrided.  Mobilized for repair.  Some portions of the ECRB were still intact below.  Epicondyle debrided, denuded, and a 3-mm anchor placed right in the middle of the epicondyle under fluoroscopic guidance.  The debrided mobilized common extensor tendon was then primarily repaired back down to the epicondyle with running FiberWire suture down and then back up and then firmly sutured in place.   A second suture from the anchor was used to oversew this on top.  Nice firm repair reattachment confirmed.  Wound irrigated and closed with subcutaneous and subcuticular Vicryl.  Margins were injected Marcaine. Sterile compressive dressing applied.  Tourniquet deflated and removed. Long-arm splint applied.  Anesthesia reversed.  Brought to recovery room.  Tolerated surgery well.  No complications.     Loreta Ave, M.D.     DFM/MEDQ  D:  08/31/2010  T:  08/31/2010  Job:  [id number]  Electronically Signed by Mckinley Jewel M.D. on 09/19/2010 02:46:57 PM

## 2010-10-11 NOTE — Assessment & Plan Note (Signed)
He is on simvastatin and this is being checked locally.  May need to switch.

## 2010-10-11 NOTE — Assessment & Plan Note (Signed)
No recurrence.  See prior functional study.

## 2010-10-11 NOTE — Assessment & Plan Note (Signed)
Continuing to monitor.  Weight loss and exercise would help.

## 2010-10-11 NOTE — Progress Notes (Signed)
HPI:  He is doing better.  Was on oxycontin for awhile, but now back on Vicodin.  No chest pain, or other symptoms.  Checks BP frequently, and Lisinopril up to 30 mg looks better to him.   Current Outpatient Prescriptions  Medication Sig Dispense Refill  . ALPRAZolam (XANAX) 0.5 MG tablet Take 0.5 mg by mouth at bedtime as needed.       Marland Kitchen amoxicillin-clavulanate (AUGMENTIN) 875-125 MG per tablet Take 1 tablet by mouth 2 (two) times daily. Prn for flare       . aspirin 81 MG tablet Take 81 mg by mouth daily.        . ciprofloxacin (CIPRO) 500 MG tablet Take 500 mg by mouth. Prn for flare       . cyclobenzaprine (FLEXERIL) 10 MG tablet Take 10 mg by mouth 3 (three) times daily as needed.        . diazepam (VALIUM) 5 MG tablet Take 5 mg by mouth every 6 (six) hours as needed.        Marland Kitchen HYDROcodone-acetaminophen (VICODIN) 5-500 MG per tablet Take 1 tablet by mouth every 6 (six) hours as needed.        . INSULIN REGULAR HUMAN (HUMULIN R) 500 UNIT/ML SOLN Inject into the skin. Sliding scale       . lisinopril (PRINIVIL,ZESTRIL) 20 MG tablet Take 20 mg by mouth daily.        . metFORMIN (GLUCOPHAGE) 1000 MG tablet Take 1,000 mg by mouth daily.        . nabumetone (RELAFEN) 500 MG tablet Take 500 mg by mouth as needed.        . simvastatin (ZOCOR) 40 MG tablet Take 40 mg by mouth at bedtime.        Marland Kitchen zolpidem (AMBIEN) 10 MG tablet Take 10 mg by mouth at bedtime as needed.        Marland Kitchen amLODipine (NORVASC) 5 MG tablet Take 1 tablet (5 mg total) by mouth daily.  90 tablet  3    No Known Allergies  Past Medical History  Diagnosis Date  . Diabetes mellitus     type II  . Hypertension   . Chest tightness   . Hyperlipidemia   . Neuropathy   . Myositis   . Anxiety     Past Surgical History  Procedure Date  . Carpal tunnel release   . Back surgery     Family History  Problem Relation Age of Onset  . Coronary artery disease      family hx of    History   Social History  . Marital Status:  Married    Spouse Name: N/A    Number of Children: N/A  . Years of Education: N/A   Occupational History  . Not on file.   Social History Main Topics  . Smoking status: Never Smoker   . Smokeless tobacco: Not on file  . Alcohol Use: No  . Drug Use: No  . Sexually Active: Not on file   Other Topics Concern  . Not on file   Social History Narrative  . No narrative on file    ROS: Please see the HPI.  All other systems reviewed and negative.  PHYSICAL EXAM:  BP 140/80  Pulse 112  Ht 6' (1.829 m)  Wt 263 lb (119.296 kg)  BMI 35.67 kg/m2  General: Well developed, well nourished, in no acute distress. Head:  Normocephalic and atraumatic. Neck: no JVD Lungs: Clear to auscultation  and percussion. Heart: Normal S1 and S2.  No murmur, rubs or gallops.  Abdomen:  Normal bowel sounds; soft; non tender; no organomegaly Pulses: Pulses normal in all 4 extremities. Extremities: No clubbing or cyanosis. No edema. Neurologic: Alert and oriented x 3.  EKG:  ASSESSMENT AND PLAN:

## 2010-10-17 ENCOUNTER — Telehealth: Payer: Self-pay

## 2010-10-17 DIAGNOSIS — E7800 Pure hypercholesterolemia, unspecified: Secondary | ICD-10-CM

## 2010-10-17 NOTE — Telephone Encounter (Signed)
Lauren  Can you call patient and have him stop simvastatin because of amlodipine. He might consider a switch to pravastatin 80mg  with recheck in six weeks. Thanks TS   Attempted to reach pt but no answer at this time.

## 2010-10-18 MED ORDER — PRAVASTATIN SODIUM 80 MG PO TABS: 80.0000 mg | ORAL_TABLET | Freq: Every day | ORAL | Status: DC

## 2010-10-18 NOTE — Telephone Encounter (Signed)
I spoke with the pt and he will stop simvastatin.  The pt would like Rx for Pravastatin sent to [facility].  The pt said he is due to see his PCP and he plans on having lab work rechecked at that time.  I instructed the pt to call our office if his PCP did not draw follow-up lipid and liver. Pt agreed with plan.

## 2010-11-21 ENCOUNTER — Telehealth: Payer: Self-pay | Admitting: Cardiology

## 2010-11-21 DIAGNOSIS — I10 Essential (primary) hypertension: Secondary | ICD-10-CM

## 2010-11-21 NOTE — Telephone Encounter (Signed)
I spoke with the pt and he is taking Lisinopril 20mg  and Amlodipine 5mg  daily.  The pt said his BP continues to remain elevated 140-155/90-95.  The pt uses [facility] pharmacy.  I will speak with Dr Riley Kill to see what he recommends for this pt in regards to medication adjustment.

## 2010-11-21 NOTE — Telephone Encounter (Signed)
Pt called about lisinopril 20mg  and amlodipine 5 mg He said these meds not bringing BP down. He has been taking for over a month please call

## 2010-12-04 NOTE — Telephone Encounter (Signed)
Dr Riley Kill called and spoke with this patient on 11/29/10.  He instructed the pt to increase his Amlodipine to 10mg  daily.  The pt will take two 5mg  Amlodipine daily and will continue to monitor his BP at home.  If the pt's BP improves on higher dose then we will send in a new Rx for 10mg  tablet.

## 2010-12-20 ENCOUNTER — Telehealth: Payer: Self-pay | Admitting: Cardiology

## 2010-12-20 MED ORDER — AMLODIPINE BESYLATE 5 MG PO TABS: 5.0000 mg | ORAL_TABLET | Freq: Every day | ORAL | Status: DC

## 2010-12-20 MED ORDER — LISINOPRIL 40 MG PO TABS: 40.0000 mg | ORAL_TABLET | Freq: Every day | ORAL | Status: DC

## 2010-12-20 NOTE — Telephone Encounter (Signed)
Addended by: Worthy Rancher D on: 12/20/2010 03:46 PM   Modules accepted: Orders

## 2010-12-20 NOTE — Telephone Encounter (Signed)
Mr Whisenant called to report that his bp is still running 150-160/85-87 after increasing his amolodipine to 10mg  daily.  He states he is also having more ankle swelling (slightly more).  He is wondering if it would be better to go back to 5mg  of amolodipine and increase the Lisinopril instead?  He states he is almost out of lisinopril and will need it refilled soon.

## 2010-12-20 NOTE — Telephone Encounter (Signed)
New Msg: Pt calling for a f/u call after medication changes per Dr. Riley Kill changes. Please return pt call to discuss further.

## 2010-12-20 NOTE — Telephone Encounter (Signed)
See addendum on note of 11/21/2010.

## 2010-12-20 NOTE — Telephone Encounter (Signed)
Pt to increase his lisinopril to 40mg , decrease amlodipine to 5mg  and get bmet in one week per Dr Riley Kill.  Med was ordered/changed and lab was scheduled and ordered.

## 2010-12-27 ENCOUNTER — Other Ambulatory Visit (INDEPENDENT_AMBULATORY_CARE_PROVIDER_SITE_OTHER): Payer: Medicare Other | Admitting: *Deleted

## 2010-12-27 DIAGNOSIS — I10 Essential (primary) hypertension: Secondary | ICD-10-CM

## 2010-12-27 LAB — BASIC METABOLIC PANEL
BUN: 12 mg/dL (ref 6–23)
Calcium: 8.7 mg/dL (ref 8.4–10.5)
Chloride: 108 mEq/L (ref 96–112)
Creatinine, Ser: 0.9 mg/dL (ref 0.4–1.5)

## 2011-01-16 ENCOUNTER — Telehealth: Payer: Self-pay | Admitting: Cardiology

## 2011-01-16 NOTE — Telephone Encounter (Signed)
I spoke with the pt and made him aware of BMP results.  The pt is continuing to monitor his BP and his BP has not responded to medication change. The pt's BP is still running around 150/90.  The pt is also having a lot of knee pain and has to take a Vicodin and Valium at night to help him rest.  The pt is scheduled to see Dr Eulah Pont next week to discuss surgical options for knee.  I will make Dr Riley Kill aware of this information and see if he has any other recommendations.

## 2011-01-16 NOTE — Telephone Encounter (Signed)
Fu call °Patient returning your call about lab results  °

## 2011-01-16 NOTE — Telephone Encounter (Signed)
I spoke with Dr Riley Kill and he feels like the pt's BP is being effected by pain from knee.  He recommends that the pt see Dr Eulah Pont and see what is recommended to relieve pain.  Will await information from pt or Dr Eulah Pont in regard to possible surgery. Pt aware of Dr Rosalyn Charters recommendation.

## 2011-01-23 ENCOUNTER — Other Ambulatory Visit (HOSPITAL_COMMUNITY): Payer: Self-pay | Admitting: Orthopedic Surgery

## 2011-01-23 DIAGNOSIS — S83206A Unspecified tear of unspecified meniscus, current injury, right knee, initial encounter: Secondary | ICD-10-CM

## 2011-01-26 ENCOUNTER — Ambulatory Visit (HOSPITAL_COMMUNITY)
Admission: RE | Admit: 2011-01-26 | Discharge: 2011-01-26 | Disposition: A | Discharge status: Home / Self Care | Payer: Medicare Other | Source: Ambulatory Visit | Attending: Orthopedic Surgery | Admitting: Orthopedic Surgery

## 2011-01-26 DIAGNOSIS — S83206A Unspecified tear of unspecified meniscus, current injury, right knee, initial encounter: Secondary | ICD-10-CM

## 2011-01-26 DIAGNOSIS — M7650 Patellar tendinitis, unspecified knee: Secondary | ICD-10-CM | POA: Insufficient documentation

## 2011-01-26 DIAGNOSIS — R937 Abnormal findings on diagnostic imaging of other parts of musculoskeletal system: Secondary | ICD-10-CM | POA: Insufficient documentation

## 2011-01-26 DIAGNOSIS — M25569 Pain in unspecified knee: Secondary | ICD-10-CM | POA: Insufficient documentation

## 2011-04-27 ENCOUNTER — Ambulatory Visit (INDEPENDENT_AMBULATORY_CARE_PROVIDER_SITE_OTHER): Payer: Medicare Other | Admitting: Cardiology

## 2011-04-27 ENCOUNTER — Encounter: Payer: Self-pay | Admitting: Cardiology

## 2011-04-27 VITALS — BP 146/82 | HR 97 | Ht 72.0 in | Wt 270.8 lb

## 2011-04-27 DIAGNOSIS — E7800 Pure hypercholesterolemia, unspecified: Secondary | ICD-10-CM

## 2011-04-27 DIAGNOSIS — R079 Chest pain, unspecified: Secondary | ICD-10-CM

## 2011-04-27 DIAGNOSIS — E785 Hyperlipidemia, unspecified: Secondary | ICD-10-CM

## 2011-04-27 DIAGNOSIS — I10 Essential (primary) hypertension: Secondary | ICD-10-CM

## 2011-04-27 MED ORDER — PRAVASTATIN SODIUM 80 MG PO TABS: 80.0000 mg | ORAL_TABLET | Freq: Every day | ORAL | Status: DC

## 2011-04-27 MED ORDER — AMLODIPINE BESYLATE 5 MG PO TABS: 5.0000 mg | ORAL_TABLET | Freq: Every day | ORAL | Status: DC

## 2011-04-27 NOTE — Progress Notes (Signed)
   HPI:  He is doing really well.  Got his shoulder and knee fixed, L knee injected.  Now able to exercise.  No current symptoms.    Current Outpatient Prescriptions  Medication Sig Dispense Refill  . ALPRAZolam (XANAX) 0.5 MG tablet Take 0.5 mg by mouth at bedtime as needed.       Marland Kitchen amLODipine (NORVASC) 5 MG tablet Take 1 tablet (5 mg total) by mouth daily.  30 tablet  6  . amoxicillin-clavulanate (AUGMENTIN) 875-125 MG per tablet Take 1 tablet by mouth 2 (two) times daily. Prn for flare       . aspirin 81 MG tablet Take 81 mg by mouth daily.        . ciprofloxacin (CIPRO) 500 MG tablet Take 500 mg by mouth. Prn for flare       . diazepam (VALIUM) 5 MG tablet Take 5 mg by mouth as needed.       Marland Kitchen HYDROcodone-acetaminophen (VICODIN) 5-500 MG per tablet Take 1 tablet by mouth every 6 (six) hours as needed.        . INSULIN REGULAR HUMAN (HUMULIN R) 500 UNIT/ML SOLN Inject into the skin. Sliding scale       . lisinopril (PRINIVIL,ZESTRIL) 40 MG tablet Take 1 tablet (40 mg total) by mouth daily.  90 tablet  3  . metFORMIN (GLUCOPHAGE) 1000 MG tablet Take 1,000 mg by mouth 2 (two) times daily with a meal.       . nabumetone (RELAFEN) 500 MG tablet Take 750 mg by mouth as needed.       . pravastatin (PRAVACHOL) 80 MG tablet Take 1 tablet (80 mg total) by mouth daily.  90 tablet  3    No Known Allergies  Past Medical History  Diagnosis Date  . Diabetes mellitus     type II  . Hypertension   . Chest tightness   . Hyperlipidemia   . Neuropathy   . Myositis   . Anxiety     Past Surgical History  Procedure Date  . Carpal tunnel release   . Back surgery     Family History  Problem Relation Age of Onset  . Coronary artery disease      family hx of    History   Social History  . Marital Status: Married    Spouse Name: N/A    Number of Children: N/A  . Years of Education: N/A   Occupational History  . Not on file.   Social History Main Topics  . Smoking status: Never Smoker    . Smokeless tobacco: Not on file  . Alcohol Use: No  . Drug Use: No  . Sexually Active: Not on file   Other Topics Concern  . Not on file   Social History Narrative  . No narrative on file    ROS: Please see the HPI.  All other systems reviewed and negative.  PHYSICAL EXAM:  BP 146/82  Pulse 97  Ht 6' (1.829 m)  Wt 270 lb 12.8 oz (122.834 kg)  BMI 36.73 kg/m2  General: Well developed, well nourished, in no acute distress. Head:  Normocephalic and atraumatic. Neck: no JVD Lungs: Clear to auscultation and percussion. Heart: Normal S1 and S2.  No murmur, rubs or gallops.  Pulses: Pulses normal in all 4 extremities. Extremities: No clubbing or cyanosis. No edema. Neurologic: Alert and oriented x 3.  EKG:  NSR.  WNL.  ASSESSMENT AND PLAN:

## 2011-04-27 NOTE — Patient Instructions (Signed)
Your physician recommends that you return for a FASTING LIPID, LIVER and BMP--nothing to eat or drink after midnight, lab opens at 8:30  Your physician wants you to follow-up in: 6 MONTHS. You will receive a reminder letter in the mail two months in advance. If you don't receive a letter, please call our office to schedule the follow-up appointment.  Your physician recommends that you continue on your current medications as directed. Please refer to the Current Medication list given to you today.

## 2011-04-27 NOTE — Assessment & Plan Note (Signed)
ECG normal.  Symptoms completely resolved.

## 2011-04-27 NOTE — Assessment & Plan Note (Signed)
TIme to recheck lipid and liver.

## 2011-04-27 NOTE — Assessment & Plan Note (Signed)
Reasonable control.  He would benefit with weight loss and exercise.  Cannot tolerate higher doses of amlodipine.  Would prefer not to use HCTZ because of glucose.  Discuss with patient.  BMET with lipid and liver.

## 2011-05-01 ENCOUNTER — Other Ambulatory Visit (INDEPENDENT_AMBULATORY_CARE_PROVIDER_SITE_OTHER): Payer: Medicare Other

## 2011-05-01 DIAGNOSIS — E785 Hyperlipidemia, unspecified: Secondary | ICD-10-CM

## 2011-05-01 DIAGNOSIS — R079 Chest pain, unspecified: Secondary | ICD-10-CM

## 2011-05-01 DIAGNOSIS — I10 Essential (primary) hypertension: Secondary | ICD-10-CM

## 2011-05-01 LAB — LIPID PANEL
HDL: 50.4 mg/dL (ref >39.00)
LDL Cholesterol: 89 mg/dL (ref 0–99)
Total CHOL/HDL Ratio: 3
Triglycerides: 160 mg/dL — ABNORMAL HIGH (ref 0.0–149.0)

## 2011-05-01 LAB — BASIC METABOLIC PANEL
CO2: 24 mEq/L (ref 19–32)
Chloride: 104 mEq/L (ref 96–112)
Glucose, Bld: 162 mg/dL — ABNORMAL HIGH (ref 70–99)
Potassium: 3.9 mEq/L (ref 3.5–5.1)
Sodium: 139 mEq/L (ref 135–145)

## 2011-05-01 LAB — HEPATIC FUNCTION PANEL
Albumin: 4.1 g/dL (ref 3.5–5.2)
Total Bilirubin: 0.6 mg/dL (ref 0.3–1.2)

## 2011-05-07 ENCOUNTER — Telehealth: Payer: Self-pay | Admitting: *Deleted

## 2011-05-07 DIAGNOSIS — E785 Hyperlipidemia, unspecified: Secondary | ICD-10-CM

## 2011-05-07 NOTE — Telephone Encounter (Signed)
Pt aware of lab results. Will stop pravastatin x4 weeks. Repeat labs lipid, liver ordered for 06/14/11 See lab note. Mylo Red RN

## 2011-06-13 ENCOUNTER — Other Ambulatory Visit (INDEPENDENT_AMBULATORY_CARE_PROVIDER_SITE_OTHER): Payer: Medicare Other

## 2011-06-13 DIAGNOSIS — E785 Hyperlipidemia, unspecified: Secondary | ICD-10-CM

## 2011-06-13 LAB — HEPATIC FUNCTION PANEL
Bilirubin, Direct: 0.1 mg/dL (ref 0.0–0.3)
Total Bilirubin: 0.7 mg/dL (ref 0.3–1.2)

## 2011-06-13 LAB — LIPID PANEL
LDL Cholesterol: 94 mg/dL (ref 0–99)
VLDL: 32.2 mg/dL (ref 0.0–40.0)

## 2011-06-14 ENCOUNTER — Other Ambulatory Visit: Payer: Medicare Other

## 2011-08-22 ENCOUNTER — Ambulatory Visit (INDEPENDENT_AMBULATORY_CARE_PROVIDER_SITE_OTHER): Payer: Medicare Other | Admitting: Physician Assistant

## 2011-08-22 ENCOUNTER — Encounter: Payer: Self-pay | Admitting: Physician Assistant

## 2011-08-22 VITALS — BP 147/89 | HR 98 | Ht 72.0 in | Wt 268.8 lb

## 2011-08-22 DIAGNOSIS — Z01810 Encounter for preprocedural cardiovascular examination: Secondary | ICD-10-CM

## 2011-08-22 DIAGNOSIS — I10 Essential (primary) hypertension: Secondary | ICD-10-CM

## 2011-08-22 MED ORDER — METOPROLOL SUCCINATE ER 25 MG PO TB24: 25.0000 mg | ORAL_TABLET | Freq: Every day | ORAL | Status: DC

## 2011-08-22 NOTE — Progress Notes (Signed)
[address] [place], [place]  [address] Phone: [phone] Fax:  [phone]  Date:  08/22/2011   Name:  David Mcdowell   DOB:  November 22, 1955   MRN:  [id number]  PCP:  Colette Ribas, MD  Primary Cardiologist:  Dr.  Shawnie Pons  Primary Electrophysiologist:  None    History of Present Illness: David Mcdowell is a 56 y.o. male who returns for surgical clearance.  He has a history of DM 2, HTN, HL and chest pain. ETT-Myoview 4/12: No ischemia, EF 58%. The patient needs left knee arthroscopy with Dr. Eulah Pont. He is somewhat limited by his left knee pain. However, he can achieve approximately 4 METs without chest discomfort or shortness of breath. He denies orthopnea, PND or edema. He denies syncope. He has been under quite a bit of stress recently. Two family members and one friend passed away in the last month.   Past Medical History  Diagnosis Date  . Diabetes mellitus     type II  . Hypertension   . Chest tightness     ETT-Myoview 4/12: EF 58%, no scar or ischemia  . Hyperlipidemia   . Neuropathy   . Myositis   . Anxiety     Current Outpatient Prescriptions  Medication Sig Dispense Refill  . ALPRAZolam (XANAX) 0.5 MG tablet Take 0.5 mg by mouth at bedtime as needed.       Marland Kitchen amLODipine (NORVASC) 5 MG tablet Take 1 tablet (5 mg total) by mouth daily.  90 tablet  3  . amoxicillin-clavulanate (AUGMENTIN) 875-125 MG per tablet Take 1 tablet by mouth 2 (two) times daily. Prn for flare       . aspirin 81 MG tablet Take 81 mg by mouth daily.        . diazepam (VALIUM) 5 MG tablet Take 5 mg by mouth as needed.       Marland Kitchen HYDROcodone-acetaminophen (VICODIN) 5-500 MG per tablet Take 1 tablet by mouth every 6 (six) hours as needed.        . INSULIN REGULAR HUMAN (HUMULIN R) 500 UNIT/ML SOLN Inject into the skin. Sliding scale       . lisinopril (PRINIVIL,ZESTRIL) 40 MG tablet Take 1 tablet (40 mg total) by mouth daily.  90 tablet  3  . metFORMIN (GLUCOPHAGE)  1000 MG tablet Take 1,000 mg by mouth 2 (two) times daily with a meal.       . pravastatin (PRAVACHOL) 80 MG tablet Take 1 tablet (80 mg total) by mouth daily.  90 tablet  3    Allergies: No Known Allergies  History  Substance Use Topics  . Smoking status: Never Smoker   . Smokeless tobacco: Not on file  . Alcohol Use: No     ROS:  Please see the history of present illness.     All other systems reviewed and negative.   PHYSICAL EXAM: VS:  BP 147/89  Pulse 98  Ht 6' (1.829 m)  Wt 268 lb 12.8 oz (121.927 kg)  BMI 36.46 kg/m2 Well nourished, well developed, in no acute distress HEENT: normal Neck: no JVD Vascular: No carotid bruits Cardiac:  normal S1, S2; RRR; no murmur Lungs:  clear to auscultation bilaterally, no wheezing, rhonchi or rales Abd: soft, nontender, no hepatomegaly Ext: Trace bilateral LE edema Skin: warm and dry Neuro:  CNs 2-12 intact, no focal abnormalities noted  EKG:  Sinus tachycardia, heart rate 101, normal axis, nonspecific ST-T wave changes  ASSESSMENT AND PLAN:  1. Osteoarthritis The patient does not have any unstable cardiac conditions.  The patient can achieve 4 METs without anginal symptoms.  He had a normal stress myoview last year. According to [facility] and AHA guidelines, the patient requires no further cardiac workup prior to his noncardiac surgery.  The patient should be at acceptable risk.  Our service is available as necessary in the perioperative period. His HR is elevated as well as his BP.  I will start Toprol XL 25 mg daily, which will help reduce his risk of cardiovascular complications.  He should continue this throughout the perioperative period.  I would recommend he continue ASA if possible as well.  2. Hypertension Add Toprol.  He is a prior paramedic.  He will check his BP and HR and let us know how it is running next week.  3. Diabetes Mellitus Managed by endocrinology.  Signed, Tereso Newcomer, PA-C  8:40 AM 08/22/2011

## 2011-08-22 NOTE — Patient Instructions (Addendum)
Your physician has recommended you make the following change in your medication: START Toprol XL 25 mg daily A prescription [facility] today and we will call pharmacy for a 30 day supply of Torol XL 25MG    Your physician recommends that you keep your  follow-up appointment for Dr. Riley Kill on 10/2011 as planned

## 2011-10-24 ENCOUNTER — Ambulatory Visit (INDEPENDENT_AMBULATORY_CARE_PROVIDER_SITE_OTHER): Payer: Medicare Other | Admitting: Cardiology

## 2011-10-24 ENCOUNTER — Encounter: Payer: Self-pay | Admitting: Cardiology

## 2011-10-24 VITALS — BP 140/72 | HR 82 | Ht 72.0 in | Wt 265.0 lb

## 2011-10-24 DIAGNOSIS — E785 Hyperlipidemia, unspecified: Secondary | ICD-10-CM

## 2011-10-24 DIAGNOSIS — R945 Abnormal results of liver function studies: Secondary | ICD-10-CM | POA: Insufficient documentation

## 2011-10-24 DIAGNOSIS — I10 Essential (primary) hypertension: Secondary | ICD-10-CM

## 2011-10-24 DIAGNOSIS — R7989 Other specified abnormal findings of blood chemistry: Secondary | ICD-10-CM

## 2011-10-24 LAB — HEPATIC FUNCTION PANEL
ALT: 49 U/L (ref 0–53)
Alkaline Phosphatase: 49 U/L (ref 39–117)
Bilirubin, Direct: 0.1 mg/dL (ref 0.0–0.3)
Total Protein: 7.2 g/dL (ref 6.0–8.3)

## 2011-10-24 NOTE — Progress Notes (Signed)
HPI:  Patient seen today in a followup visit. He really is doing pretty well he has knee surgery done arthroscopically, and he improved after this. His back was still bothering, and had to pain injections in the back. This is improved his overall status is now lost 7 pounds. He feels good he denies any chest pain his blood pressure is now improved his minor edema associated with his medication.  Current Outpatient Prescriptions  Medication Sig Dispense Refill  . ALPRAZolam (XANAX) 0.5 MG tablet Take 0.5 mg by mouth at bedtime as needed.       Marland Kitchen amLODipine (NORVASC) 5 MG tablet Take 1 tablet (5 mg total) by mouth daily.  90 tablet  3  . amoxicillin-clavulanate (AUGMENTIN) 875-125 MG per tablet Take 1 tablet by mouth 2 (two) times daily. Prn for flare       . aspirin 81 MG tablet Take 81 mg by mouth daily.        . diazepam (VALIUM) 5 MG tablet Take 5 mg by mouth as needed.       Marland Kitchen HYDROcodone-acetaminophen (VICODIN) 5-500 MG per tablet Take 1 tablet by mouth every 6 (six) hours as needed.        . INSULIN REGULAR HUMAN (HUMULIN R) 500 UNIT/ML SOLN Inject into the skin. Sliding scale       . lisinopril (PRINIVIL,ZESTRIL) 40 MG tablet Take 1 tablet (40 mg total) by mouth daily.  90 tablet  3  . metFORMIN (GLUCOPHAGE) 1000 MG tablet Take 1,000 mg by mouth 2 (two) times daily with a meal.       . metoprolol succinate (TOPROL-XL) 25 MG 24 hr tablet Take 1 tablet (25 mg total) by mouth daily.  90 tablet  3  . nabumetone (RELAFEN) 750 MG tablet Take 750 mg by mouth as needed.      . pravastatin (PRAVACHOL) 80 MG tablet Take 1 tablet (80 mg total) by mouth daily.  90 tablet  3    No Known Allergies  Past Medical History  Diagnosis Date  . Diabetes mellitus     type II  . Hypertension   . Chest tightness     ETT-Myoview 4/12: EF 58%, no scar or ischemia  . Hyperlipidemia   . Neuropathy   . Myositis   . Anxiety     Past Surgical History  Procedure Date  . Carpal tunnel release   . Back  surgery     Family History  Problem Relation Age of Onset  . Coronary artery disease      family hx of    History   Social History  . Marital Status: Married    Spouse Name: N/A    Number of Children: N/A  . Years of Education: N/A   Occupational History  . Not on file.   Social History Main Topics  . Smoking status: Never Smoker   . Smokeless tobacco: Not on file  . Alcohol Use: No  . Drug Use: No  . Sexually Active: Not on file   Other Topics Concern  . Not on file   Social History Narrative  . No narrative on file    ROS: Please see the HPI.  All other systems reviewed and negative.  PHYSICAL EXAM:  BP 140/72  Pulse 82  Ht 6' (1.829 m)  Wt 265 lb (120.203 kg)  BMI 35.94 kg/m2  General: Well developed, well nourished, in no acute distress. Head:  Normocephalic and atraumatic. Neck: no JVD Lungs:  Clear to auscultation and percussion. Heart: Normal S1 and S2.  No murmur, rubs or gallops.  Pulses: Pulses normal in all 4 extremities. Extremities: No clubbing or cyanosis. No edema. Neurologic: Alert and oriented x 3.  EKG: NSR.  WNL.   ASSESSMENT AND PLAN:

## 2011-10-24 NOTE — Assessment & Plan Note (Signed)
This is much improved on medical therapy. His pressure improved since his pain issues been resolved. He is on a combination program. Hopefully with continued weight loss, his need for medication will diminish.

## 2011-10-24 NOTE — Assessment & Plan Note (Signed)
The patient will have a repeat lipid liver done today. He has mild elevation of his liver functions, as a result, we may refer him to GI for further evaluation. More than likely this is due to fatty liver. Given his diabetes, we would like to be able to continue him on statin therapy.

## 2011-10-24 NOTE — Assessment & Plan Note (Signed)
The liver functions are very mild. We will repeat them today. If they're elevated GI referral will be initiated

## 2011-10-24 NOTE — Patient Instructions (Addendum)
Your physician recommends that you have lab work today: LIPID and LIVER  Your physician recommends that you schedule a follow-up appointment in: MARCH 2014 with Dr Riley Kill  We will refer you to Dr Karilyn Cota if your liver enzymes are still elevated.   Your physician recommends that you continue on your current medications as directed. Please refer to the Current Medication list given to you today.

## 2011-11-06 ENCOUNTER — Telehealth: Payer: Self-pay | Admitting: Cardiology

## 2011-11-06 DIAGNOSIS — E7800 Pure hypercholesterolemia, unspecified: Secondary | ICD-10-CM

## 2011-11-06 NOTE — Telephone Encounter (Signed)
Pt returning nurse call he can be reached at [phone]

## 2011-11-06 NOTE — Telephone Encounter (Signed)
Pt aware of lab results by phone. The pt will have labs rechecked 01/22/12.

## 2011-11-19 ENCOUNTER — Other Ambulatory Visit: Payer: Self-pay | Admitting: *Deleted

## 2011-11-19 DIAGNOSIS — I10 Essential (primary) hypertension: Secondary | ICD-10-CM

## 2011-11-19 MED ORDER — LISINOPRIL 40 MG PO TABS: 40.0000 mg | ORAL_TABLET | Freq: Every day | ORAL | Status: DC

## 2012-01-12 ENCOUNTER — Encounter: Payer: Self-pay | Admitting: Cardiology

## 2012-01-16 ENCOUNTER — Encounter: Payer: Self-pay | Admitting: Cardiology

## 2012-01-22 ENCOUNTER — Other Ambulatory Visit: Payer: Medicare Other

## 2012-02-05 ENCOUNTER — Other Ambulatory Visit: Payer: Self-pay

## 2012-02-05 DIAGNOSIS — Z01810 Encounter for preprocedural cardiovascular examination: Secondary | ICD-10-CM

## 2012-02-05 DIAGNOSIS — I10 Essential (primary) hypertension: Secondary | ICD-10-CM

## 2012-02-05 MED ORDER — METOPROLOL SUCCINATE ER 25 MG PO TB24: 25.0000 mg | ORAL_TABLET | Freq: Every day | ORAL | Status: DC

## 2012-02-05 MED ORDER — LISINOPRIL 40 MG PO TABS: 40.0000 mg | ORAL_TABLET | Freq: Every day | ORAL | Status: DC

## 2012-02-05 MED ORDER — AMLODIPINE BESYLATE 5 MG PO TABS: 5.0000 mg | ORAL_TABLET | Freq: Every day | ORAL | Status: DC

## 2012-02-11 ENCOUNTER — Other Ambulatory Visit: Payer: Self-pay

## 2012-02-11 DIAGNOSIS — E7800 Pure hypercholesterolemia, unspecified: Secondary | ICD-10-CM

## 2012-02-11 MED ORDER — PRAVASTATIN SODIUM 80 MG PO TABS: 80.0000 mg | ORAL_TABLET | Freq: Every day | ORAL | Status: DC

## 2012-02-23 ENCOUNTER — Other Ambulatory Visit: Payer: Self-pay

## 2012-03-18 ENCOUNTER — Encounter: Payer: Self-pay | Admitting: Cardiology

## 2012-03-18 ENCOUNTER — Ambulatory Visit (INDEPENDENT_AMBULATORY_CARE_PROVIDER_SITE_OTHER): Payer: Medicare Other | Admitting: Cardiology

## 2012-03-18 VITALS — BP 138/86 | HR 99 | Ht 72.0 in | Wt 270.0 lb

## 2012-03-18 DIAGNOSIS — R945 Abnormal results of liver function studies: Secondary | ICD-10-CM

## 2012-03-18 DIAGNOSIS — I10 Essential (primary) hypertension: Secondary | ICD-10-CM

## 2012-03-18 DIAGNOSIS — R1013 Epigastric pain: Secondary | ICD-10-CM

## 2012-03-18 DIAGNOSIS — R7989 Other specified abnormal findings of blood chemistry: Secondary | ICD-10-CM

## 2012-03-18 DIAGNOSIS — R079 Chest pain, unspecified: Secondary | ICD-10-CM

## 2012-03-18 DIAGNOSIS — E785 Hyperlipidemia, unspecified: Secondary | ICD-10-CM

## 2012-03-18 MED ORDER — OMEPRAZOLE 20 MG PO CPDR: 20.0000 mg | DELAYED_RELEASE_CAPSULE | Freq: Every day | ORAL | Status: DC

## 2012-03-18 NOTE — Assessment & Plan Note (Signed)
Patient is having a lot of "gas", similar to what he has had in the past.  We assume it is this.  Will add prilosec at his request, and see back in three weeks to determine if we are going down the right path.

## 2012-03-18 NOTE — Assessment & Plan Note (Signed)
These were normal on the last visit.

## 2012-03-18 NOTE — Assessment & Plan Note (Signed)
Moderate control on current regimen.

## 2012-03-18 NOTE — Progress Notes (Signed)
HPI:  Patient seen today in follow up.  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 He's had this for quite some time, and has had 3 prior surgeries for this. When it flares, he starts on an antibiotic and it improves. He's been through this multiple times. She shortly prior to this he started to develop a lot of gas. He's had acid reflux in the past, and this feels similar. He does feel a lot of burping and belching. He does not feel diaphoresis or significant chest pain.   No exertional symptoms.    Current Outpatient Prescriptions  Medication Sig Dispense Refill  . ALPRAZolam (XANAX) 0.5 MG tablet Take 0.5 mg by mouth at bedtime as needed.       Marland Kitchen amLODipine (NORVASC) 5 MG tablet Take 1 tablet (5 mg total) by mouth daily.  90 tablet  3  . amoxicillin-clavulanate (AUGMENTIN) 875-125 MG per tablet Take 1 tablet by mouth 2 (two) times daily. Prn for flare       . aspirin 81 MG tablet Take 81 mg by mouth daily.        . diazepam (VALIUM) 5 MG tablet Take 5 mg by mouth as needed.       Marland Kitchen HYDROcodone-acetaminophen (VICODIN) 5-500 MG per tablet Take 1 tablet by mouth every 6 (six) hours as needed.        . INSULIN REGULAR HUMAN (HUMULIN R) 500 UNIT/ML SOLN Inject into the skin. Sliding scale       . lisinopril (PRINIVIL,ZESTRIL) 40 MG tablet Take 1 tablet (40 mg total) by mouth daily.  90 tablet  3  . metFORMIN (GLUCOPHAGE) 1000 MG tablet Take 1,000 mg by mouth 2 (two) times daily with a meal.       . metoprolol succinate (TOPROL-XL) 25 MG 24 hr tablet Take 1 tablet (25 mg total) by mouth daily.  90 tablet  3  . nabumetone (RELAFEN) 750 MG tablet Take 750 mg by mouth as needed.      . pravastatin (PRAVACHOL) 80 MG tablet Take 1 tablet (80 mg total) by mouth daily.  90 tablet  3   No current facility-administered medications for this visit.    No Known Allergies  Past Medical History  Diagnosis Date  . Diabetes mellitus     type II  . Hypertension   .  Chest tightness     ETT-Myoview 4/12: EF 58%, no scar or ischemia  . Hyperlipidemia   . Neuropathy   . Myositis   . Anxiety     Past Surgical History  Procedure Laterality Date  . Carpal tunnel release    . Back surgery      Family History  Problem Relation Age of Onset  . Coronary artery disease      family hx of    History   Social History  . Marital Status: Married    Spouse Name: N/A    Number of Children: N/A  . Years of Education: N/A   Occupational History  . Not on file.   Social History Main Topics  . Smoking status: Never Smoker   . Smokeless tobacco: Not on file  . Alcohol Use: No  . Drug Use: No  . Sexually Active: Not on file   Other Topics Concern  . Not on file   Social History Narrative  . No narrative on file    ROS: Please see the HPI.  All other  systems reviewed and negative.  PHYSICAL EXAM:  BP 138/86  Pulse 99  Ht 6' (1.829 m)  Wt 270 lb (122.471 kg)  BMI 36.61 kg/m2  SpO2 97%  General: Well developed, well nourished, in no acute distress. Head:  Normocephalic and atraumatic. Neck: no JVD Lungs: Clear to auscultation and percussion. Heart: Normal S1 and S2.  No murmur, rubs or gallops.  Extremities: No clubbing or cyanosis. No edema. Neurologic: Alert and oriented x 3.  EKG:  NSR.  Delay in R wave prob due to lead position.  Otherwise normal tracing.    ASSESSMENT AND PLAN:

## 2012-03-18 NOTE — Assessment & Plan Note (Signed)
Reasonable control. 

## 2012-03-18 NOTE — Patient Instructions (Addendum)
Your physician has recommended you make the following change in your medication: start taking Omeprazole 20 mg daily  Your physician recommends that you schedule a follow-up appointment in: 4/14

## 2012-04-21 ENCOUNTER — Ambulatory Visit (INDEPENDENT_AMBULATORY_CARE_PROVIDER_SITE_OTHER): Payer: Medicare Other | Admitting: Cardiology

## 2012-04-21 ENCOUNTER — Encounter: Payer: Self-pay | Admitting: Cardiology

## 2012-04-21 VITALS — BP 120/72 | HR 102 | Ht 72.0 in | Wt 275.0 lb

## 2012-04-21 DIAGNOSIS — R7989 Other specified abnormal findings of blood chemistry: Secondary | ICD-10-CM

## 2012-04-21 DIAGNOSIS — I10 Essential (primary) hypertension: Secondary | ICD-10-CM

## 2012-04-21 DIAGNOSIS — R945 Abnormal results of liver function studies: Secondary | ICD-10-CM

## 2012-04-21 DIAGNOSIS — E785 Hyperlipidemia, unspecified: Secondary | ICD-10-CM

## 2012-04-21 DIAGNOSIS — Z01810 Encounter for preprocedural cardiovascular examination: Secondary | ICD-10-CM

## 2012-04-21 MED ORDER — METOPROLOL SUCCINATE ER 50 MG PO TB24: 50.0000 mg | ORAL_TABLET | Freq: Every day | ORAL | Status: DC

## 2012-04-21 NOTE — Progress Notes (Addendum)
HPI:  Patient is in for followup visit. He's been having some trouble, specifically related to the feeling of bloating. After church yesterday his ankles are swollen, and he had to elevate them. He says he is not using an inordinate amount of sodium. He denies any chest pain. He just feels full.  Current Outpatient Prescriptions  Medication Sig Dispense Refill  . ALPRAZolam (XANAX) 0.5 MG tablet Take 0.5 mg by mouth at bedtime as needed.       Marland Kitchen amoxicillin-clavulanate (AUGMENTIN) 875-125 MG per tablet Take 1 tablet by mouth 2 (two) times daily. Prn for flare       . aspirin 81 MG tablet Take 81 mg by mouth daily.        . diazepam (VALIUM) 5 MG tablet Take 5 mg by mouth as needed.       Marland Kitchen HYDROcodone-acetaminophen (VICODIN) 5-500 MG per tablet Take 1 tablet by mouth every 6 (six) hours as needed.        . INSULIN REGULAR HUMAN (HUMULIN R) 500 UNIT/ML SOLN Inject into the skin. Sliding scale       . lisinopril (PRINIVIL,ZESTRIL) 40 MG tablet Take 1 tablet (40 mg total) by mouth daily.  90 tablet  3  . metFORMIN (GLUCOPHAGE) 1000 MG tablet Take 1,000 mg by mouth 2 (two) times daily with a meal.       . metoprolol succinate (TOPROL-XL) 50 MG 24 hr tablet Take 1 tablet (50 mg total) by mouth daily.  90 tablet  3  . nabumetone (RELAFEN) 750 MG tablet Take 750 mg by mouth as needed.      Marland Kitchen omeprazole (PRILOSEC) 20 MG capsule Take 1 capsule (20 mg total) by mouth daily.  90 capsule  1  . pravastatin (PRAVACHOL) 80 MG tablet Take 1 tablet (80 mg total) by mouth daily.  90 tablet  3   No current facility-administered medications for this visit.    No Known Allergies  Past Medical History  Diagnosis Date  . Diabetes mellitus     type II  . Hypertension   . Chest tightness     ETT-Myoview 4/12: EF 58%, no scar or ischemia  . Hyperlipidemia   . Neuropathy   . Myositis   . Anxiety     Past Surgical History  Procedure Laterality Date  . Carpal tunnel release    . Back surgery       Family History  Problem Relation Age of Onset  . Coronary artery disease      family hx of    History   Social History  . Marital Status: Married    Spouse Name: N/A    Number of Children: N/A  . Years of Education: N/A   Occupational History  . Not on file.   Social History Main Topics  . Smoking status: Never Smoker   . Smokeless tobacco: Not on file  . Alcohol Use: No  . Drug Use: No  . Sexually Active: Not on file   Other Topics Concern  . Not on file   Social History Narrative  . No narrative on file    ROS: Please see the HPI.  All other systems reviewed and negative.  PHYSICAL EXAM:  BP 120/72  Pulse 102  Ht 6' (1.829 m)  Wt 275 lb (124.739 kg)  BMI 37.29 kg/m2  SpO2 93%  General: Well developed, well nourished, in no acute distress. Head:  Normocephalic and atraumatic. Neck: no JVD Lungs: Clear to auscultation  and percussion. Heart: Normal S1 and S2.  No murmur, rubs or gallops.  Pulses: Pulses normal in all 4 extremities. Extremities: No clubbing or cyanosis. Trace edema of the LE.  Neurologic: Alert and oriented x 3.  EKG:  ASSESSMENT AND PLAN:  1.  Bloating  -- ? Related to Norvasc 2.  DC amlodipine 3.  Increase metoprolol to 50mg  daily  (XL) 4.  FU clinic in two weeks, then Dr. Diona Browner who used to live down the street from him.

## 2012-04-21 NOTE — Patient Instructions (Addendum)
Your physician recommends that you schedule a follow-up appointment in:  2 weeks with PA and 6 weeks with Dr. Diona Browner in [place].   Your physician has recommended you make the following change in your medication:  Increase Toprol to 50 mg by mouth daily.  Stop amlodipine.

## 2012-04-26 NOTE — Assessment & Plan Note (Signed)
Probably should have repeat lipid labs when seen in [place].  Patient is an insulin treated diabetic.

## 2012-04-26 NOTE — Assessment & Plan Note (Signed)
Resolved on last check 

## 2012-04-26 NOTE — Assessment & Plan Note (Signed)
Adjust meds with elevation of beta blockade, and reduction of amlodipine.  Early fu with Dr. Diona Browner who the patient knows.

## 2012-05-16 ENCOUNTER — Ambulatory Visit: Payer: Medicare Other | Admitting: Physician Assistant

## 2012-06-20 ENCOUNTER — Encounter: Payer: Self-pay | Admitting: Cardiology

## 2012-07-08 ENCOUNTER — Ambulatory Visit (INDEPENDENT_AMBULATORY_CARE_PROVIDER_SITE_OTHER): Payer: Medicare Other | Admitting: Cardiology

## 2012-07-08 ENCOUNTER — Encounter: Payer: Self-pay | Admitting: Cardiology

## 2012-07-08 VITALS — BP 150/90 | HR 90 | Ht 72.0 in | Wt 269.0 lb

## 2012-07-08 DIAGNOSIS — R142 Eructation: Secondary | ICD-10-CM

## 2012-07-08 DIAGNOSIS — R141 Gas pain: Secondary | ICD-10-CM

## 2012-07-08 DIAGNOSIS — R072 Precordial pain: Secondary | ICD-10-CM

## 2012-07-08 DIAGNOSIS — Z01810 Encounter for preprocedural cardiovascular examination: Secondary | ICD-10-CM

## 2012-07-08 DIAGNOSIS — E782 Mixed hyperlipidemia: Secondary | ICD-10-CM | POA: Insufficient documentation

## 2012-07-08 DIAGNOSIS — R1013 Epigastric pain: Secondary | ICD-10-CM

## 2012-07-08 DIAGNOSIS — E119 Type 2 diabetes mellitus without complications: Secondary | ICD-10-CM

## 2012-07-08 DIAGNOSIS — I10 Essential (primary) hypertension: Secondary | ICD-10-CM

## 2012-07-08 DIAGNOSIS — R6881 Early satiety: Secondary | ICD-10-CM

## 2012-07-08 MED ORDER — METOPROLOL SUCCINATE ER 50 MG PO TB24: 50.0000 mg | ORAL_TABLET | Freq: Every day | ORAL | Status: DC

## 2012-07-08 MED ORDER — OMEPRAZOLE 20 MG PO CPDR: 20.0000 mg | DELAYED_RELEASE_CAPSULE | Freq: Every day | ORAL | Status: DC

## 2012-07-08 NOTE — Assessment & Plan Note (Signed)
On statin therapy, very good LDL control by last assessment.

## 2012-07-08 NOTE — Assessment & Plan Note (Signed)
No reproducible exertional symptoms at this point, negative Myoview in the last 2 years. He is on a reasonable medical regimen for risk factor reduction.

## 2012-07-08 NOTE — Addendum Note (Signed)
Addended by: Thompson Grayer on: 07/08/2012 09:15 AM   Modules accepted: Orders

## 2012-07-08 NOTE — Assessment & Plan Note (Signed)
Keep regular followup with endocrinology.

## 2012-07-08 NOTE — Assessment & Plan Note (Signed)
Blood pressure elevated today, but better by home checks. At this point no specific change to current regimen. Could always consider a low-dose diuretic such as HCTZ if needed. Otherwise have recommended a basic aerobic exercise regimen, perhaps walking or swimming in a pool, without direct pressure on his knees. Also sodium restriction.

## 2012-07-08 NOTE — Patient Instructions (Addendum)
Your physician recommends that you schedule a follow-up appointment in: 6 MONTHS WITH DR SM  You have been referred to DR [facility] OFFICE A staff member from our office will alert you the with appointment date and time, once available

## 2012-07-08 NOTE — Progress Notes (Signed)
Clinical Summary Mr. Weekes is a 57 y.o.male presenting for an office visit. He is a former patient of Dr. Riley Kill, last seen in April of this year. From a cardiac perspective, he has been stable. Reports no reproducible exertional chest pain. He has limited functionally by bilateral knee pain. Not able to walk for long distances without pain.  Lab work from January revealed potassium 4.3, BUN 13, creatinine 0.9, LDL 65, cholesterol 146, triglycerides 130, HDL 55, AST 19, ALT 28. He reports compliance with his medications, also follows up regularly with endocrinology.  Main complaint today is of dyspepsia, significant belching after he eats and stands up, also early satiety. He has been on a PPI without significant improvement in symptoms. No previous gastrointestinal evaluation. Denies frank abdominal pain and postprandial setting, no bowel changes. No obvious bleeding.  Blood pressure is elevated today. He tells me that this is not typically the case when he checks it at home, systolics usually in the 130s. He was taken off Norvasc by Dr. Riley Kill previously related to swelling, resolved at this point.  No Known Allergies  Current Outpatient Prescriptions  Medication Sig Dispense Refill  . amoxicillin-clavulanate (AUGMENTIN) 875-125 MG per tablet Take 1 tablet by mouth 2 (two) times daily. Prn for flare       . aspirin 81 MG tablet Take 81 mg by mouth daily.        Marland Kitchen escitalopram (LEXAPRO) 10 MG tablet Take 10 mg by mouth daily.      Marland Kitchen HYDROcodone-acetaminophen (VICODIN) 5-500 MG per tablet Take 1 tablet by mouth every 6 (six) hours as needed.        . INSULIN REGULAR HUMAN (HUMULIN R) 500 UNIT/ML SOLN Inject into the skin. Sliding scale       . lisinopril (PRINIVIL,ZESTRIL) 40 MG tablet Take 1 tablet (40 mg total) by mouth daily.  90 tablet  3  . metFORMIN (GLUCOPHAGE) 1000 MG tablet Take 1,000 mg by mouth 2 (two) times daily with a meal.       . metoprolol succinate (TOPROL-XL) 50 MG 24 hr  tablet Take 1 tablet (50 mg total) by mouth daily.  90 tablet  3  . nabumetone (RELAFEN) 750 MG tablet Take 750 mg by mouth as needed.      Marland Kitchen omeprazole (PRILOSEC) 20 MG capsule Take 1 capsule (20 mg total) by mouth daily.  90 capsule  1  . pravastatin (PRAVACHOL) 80 MG tablet Take 1 tablet (80 mg total) by mouth daily.  90 tablet  3  . ALPRAZolam (XANAX) 0.5 MG tablet Take 0.5 mg by mouth at bedtime as needed.       . diazepam (VALIUM) 5 MG tablet Take 5 mg by mouth as needed.        No current facility-administered medications for this visit.    Past Medical History  Diagnosis Date  . Type 2 diabetes mellitus   . Essential hypertension, benign   . Chest tightness     ETT-Myoview 4/12: EF 58%, no scar or ischemia  . Mixed hyperlipidemia   . Neuropathy   . Myositis   . Anxiety     Social History Mr. Supinski reports that he has never smoked. He does not have any smokeless tobacco history on file. Mr. Wold reports that he does not drink alcohol.  Review of Systems Negative except as outlined above.  Physical Examination Filed Vitals:   07/08/12 0833  BP: 150/90  Pulse: 90   Filed Weights  07/08/12 0833  Weight: 269 lb (122.018 kg)   Obese male, comfortable at rest. HEENT: Conjunctiva and lids normal, oropharynx clear. Neck: Supple, no elevated JVP or carotid bruits, no thyromegaly. Lungs: Clear to auscultation, nonlabored breathing at rest. Cardiac: Regular rate and rhythm, no S3 or significant systolic murmur, no pericardial rub. Abdomen: Soft, nontender, bowel sounds present. Extremities: No pitting edema, distal pulses 2+. Skin: Warm and dry. Musculoskeletal: No kyphosis. Neuropsychiatric: Alert and oriented x3, affect grossly appropriate.   Problem List and Plan   Essential hypertension, benign Blood pressure elevated today, but better by home checks. At this point no specific change to current regimen. Could always consider a low-dose diuretic such as HCTZ  if needed. Otherwise have recommended a basic aerobic exercise regimen, perhaps walking or swimming in a pool, without direct pressure on his knees. Also sodium restriction.  Mixed hyperlipidemia On statin therapy, very good LDL control by last assessment.  Type 2 diabetes mellitus Keep regular followup with endocrinology.  Precordial pain No reproducible exertional symptoms at this point, negative Myoview in the last 2 years. He is on a reasonable medical regimen for risk factor reduction.    Jonelle Sidle, M.D., F.A.C.C.

## 2012-07-14 ENCOUNTER — Telehealth: Payer: Self-pay | Admitting: Cardiology

## 2012-07-14 DIAGNOSIS — I10 Essential (primary) hypertension: Secondary | ICD-10-CM

## 2012-07-14 DIAGNOSIS — Z01810 Encounter for preprocedural cardiovascular examination: Secondary | ICD-10-CM

## 2012-07-14 MED ORDER — METOPROLOL SUCCINATE ER 50 MG PO TB24: 50.0000 mg | ORAL_TABLET | Freq: Every day | ORAL | Status: DC

## 2012-07-14 NOTE — Telephone Encounter (Signed)
Can you please resend Toprol to [facility] in [place]/tgs

## 2012-07-14 NOTE — Telephone Encounter (Signed)
Medication sent via escribe.  

## 2012-07-16 ENCOUNTER — Telehealth: Payer: Self-pay | Admitting: *Deleted

## 2012-07-16 NOTE — Telephone Encounter (Signed)
Please call a verbal order to [facility] in [place] for patients metoprolol 50mg . We have already done it in [place] but they are not getting it for some reason.

## 2012-07-16 NOTE — Telephone Encounter (Signed)
Called [facility] in [place] to verbally refill Metoprolol 50 mg. Advised pt.

## 2012-11-13 ENCOUNTER — Other Ambulatory Visit: Payer: Self-pay

## 2012-11-20 ENCOUNTER — Ambulatory Visit: Payer: Medicare Other | Admitting: Infectious Diseases

## 2012-11-25 ENCOUNTER — Encounter: Payer: Self-pay | Admitting: Infectious Diseases

## 2012-11-25 ENCOUNTER — Telehealth: Payer: Self-pay | Admitting: *Deleted

## 2012-11-25 ENCOUNTER — Ambulatory Visit (INDEPENDENT_AMBULATORY_CARE_PROVIDER_SITE_OTHER): Payer: Medicare Other | Admitting: Infectious Diseases

## 2012-11-25 VITALS — BP 127/78 | HR 116 | Temp 98.1°F | Wt 273.0 lb

## 2012-11-25 DIAGNOSIS — E1142 Type 2 diabetes mellitus with diabetic polyneuropathy: Secondary | ICD-10-CM

## 2012-11-25 DIAGNOSIS — L723 Sebaceous cyst: Secondary | ICD-10-CM

## 2012-11-25 DIAGNOSIS — L729 Follicular cyst of the skin and subcutaneous tissue, unspecified: Secondary | ICD-10-CM | POA: Insufficient documentation

## 2012-11-25 DIAGNOSIS — E1149 Type 2 diabetes mellitus with other diabetic neurological complication: Secondary | ICD-10-CM

## 2012-11-25 DIAGNOSIS — E1140 Type 2 diabetes mellitus with diabetic neuropathy, unspecified: Secondary | ICD-10-CM

## 2012-11-25 NOTE — Assessment & Plan Note (Signed)
He will cont to f/u with his PCP. I cautioned him that better control would help his healing.

## 2012-11-25 NOTE — Assessment & Plan Note (Signed)
Not clear what the etiology of this is. It appears to be chronic, could query if this is a fistula? Seems unlikely as it is currently not draining.  I have asked him to f/u with Dr Carolynne Edouard, the surgeon who has previously evaluated him for this prior. I am not sure (but am hopeful?) that a definitve procedure could be done. Given his history, this is unlikely to preclude recurrences. He does not currently need anbx.   Will see him back prn.

## 2012-11-25 NOTE — Progress Notes (Signed)
  Subjective:    Patient ID: David Mcdowell, male    DOB: [birth year], 57 y.o.   MRN: [id number]  HPI 57 yo M with hx of DM2 (> 25 yrs), HTN,  and hyyperlipidemia and recurrent abscess of L groin. Has been having recurrent pilonidal cysts. Has been seen by surgery at least 4 times. Has been treated with augmentin in the past with good success (last dose > 1 month). Typically lesions are on his perineum. More recently has had on L ear, chin.  Last A1C > 10. Is on insulin pump. Needs TKR x 2. Sleeps poorly. Started on lexapro recently for which has helped his pain and anxiety. Still has insomnia though.   Review of Systems  Constitutional: Negative for fever, chills, appetite change and unexpected weight change.  Eyes: Negative for visual disturbance.  Gastrointestinal: Negative for diarrhea and constipation.  Genitourinary: Negative for difficulty urinating.  Neurological: Positive for numbness.  Psychiatric/Behavioral: Positive for sleep disturbance.  occas night sweats. Vision checked this year at [facility].      Objective:   Physical Exam  Constitutional: He appears well-developed and well-nourished.  HENT:  Mouth/Throat: No oropharyngeal exudate.  Eyes: EOM are normal. Pupils are equal, round, and reactive to light.  Neck: Neck supple.  Cardiovascular: Normal rate, regular rhythm and normal heart sounds.   Pulmonary/Chest: Effort normal and breath sounds normal.  Abdominal: Soft. Bowel sounds are normal. There is no tenderness. There is no rebound.  Genitourinary:     Lymphadenopathy:    He has no cervical adenopathy.          Assessment & Plan:

## 2012-11-25 NOTE — Telephone Encounter (Signed)
Made patient an appt with [facility] per Dr [name]. Appointment is made with Dr Corliss Skains for 12/08/12 at 150 pm. Patient is aware of the appt and phone number to call if he can not make it for any reason.

## 2012-12-08 ENCOUNTER — Ambulatory Visit (INDEPENDENT_AMBULATORY_CARE_PROVIDER_SITE_OTHER): Payer: Medicare Other | Admitting: Surgery

## 2012-12-10 ENCOUNTER — Ambulatory Visit (INDEPENDENT_AMBULATORY_CARE_PROVIDER_SITE_OTHER): Payer: Medicare Other | Admitting: General Surgery

## 2012-12-10 ENCOUNTER — Encounter (INDEPENDENT_AMBULATORY_CARE_PROVIDER_SITE_OTHER): Payer: Self-pay | Admitting: General Surgery

## 2012-12-10 VITALS — BP 128/70 | HR 100 | Temp 96.8°F | Resp 20 | Ht 72.0 in | Wt 274.0 lb

## 2012-12-10 DIAGNOSIS — K603 Anal fistula: Secondary | ICD-10-CM

## 2012-12-10 NOTE — Patient Instructions (Signed)
Will arrange for you to see Dr. Maisie Fus when you are ready

## 2012-12-10 NOTE — Progress Notes (Signed)
Patient ID: David Mcdowell, male   DOB: [birth year], 57 y.o.   MRN: [id number]  Chief Complaint  Patient presents with  . Abscess    buttock    HPI David Mcdowell is a 57 y.o. male.  We are asked to the patient in consultation by Dr. Phillips Odor to evaluate him for a perirectal abscess. The patient is a 57 year old white male who apparently has had an incision and drainage of this perirectal abscess multiple times in the past. The abscess has never resolved completely. He has periods of time when he gets very tender and drains. He is usually able to get this to go away with Augmentin. He denies any fevers or chills. He does state that the abscess and opening never completely goes away  HPI  Past Medical History  Diagnosis Date  . Type 2 diabetes mellitus   . Essential hypertension, benign   . Chest tightness     ETT-Myoview 4/12: EF 58%, no scar or ischemia  . Mixed hyperlipidemia   . Neuropathy   . Myositis   . Anxiety     Past Surgical History  Procedure Laterality Date  . Carpal tunnel release    . Back surgery      Family History  Problem Relation Age of Onset  . Coronary artery disease    . Heart disease Father   . Diabetes Father     Social History History  Substance Use Topics  . Smoking status: Never Smoker   . Smokeless tobacco: Not on file  . Alcohol Use: No    No Known Allergies  Current Outpatient Prescriptions  Medication Sig Dispense Refill  . ALPRAZolam (XANAX) 0.5 MG tablet Take 0.5 mg by mouth at bedtime as needed.       Marland Kitchen amoxicillin-clavulanate (AUGMENTIN) 875-125 MG per tablet Take 1 tablet by mouth 2 (two) times daily. Prn for flare       . aspirin 81 MG tablet Take 81 mg by mouth daily.        . diazepam (VALIUM) 5 MG tablet Take 5 mg by mouth as needed.       Marland Kitchen escitalopram (LEXAPRO) 10 MG tablet Take 10 mg by mouth daily.      Marland Kitchen HYDROcodone-acetaminophen (VICODIN) 5-500 MG per tablet Take 1 tablet by mouth every 6 (six) hours as needed.         . INSULIN REGULAR HUMAN (HUMULIN R) 500 UNIT/ML SOLN Inject into the skin. Sliding scale       . lisinopril (PRINIVIL,ZESTRIL) 40 MG tablet Take 1 tablet (40 mg total) by mouth daily.  90 tablet  3  . metFORMIN (GLUCOPHAGE) 1000 MG tablet Take 1,000 mg by mouth 2 (two) times daily with a meal.       . metoprolol succinate (TOPROL-XL) 50 MG 24 hr tablet Take 1 tablet (50 mg total) by mouth daily.  90 tablet  3  . nabumetone (RELAFEN) 750 MG tablet Take 750 mg by mouth as needed.      Marland Kitchen omeprazole (PRILOSEC) 20 MG capsule Take 1 capsule (20 mg total) by mouth daily.  90 capsule  1  . pravastatin (PRAVACHOL) 80 MG tablet Take 1 tablet (80 mg total) by mouth daily.  90 tablet  3   No current facility-administered medications for this visit.    Review of Systems Review of Systems  Constitutional: Negative.   HENT: Negative.   Eyes: Negative.   Respiratory: Negative.   Cardiovascular: Negative.   Gastrointestinal:  Positive for rectal pain.  Endocrine: Negative.   Genitourinary: Negative.   Musculoskeletal: Negative.   Skin: Negative.   Allergic/Immunologic: Negative.   Neurological: Negative.   Hematological: Negative.   Psychiatric/Behavioral: Negative.     Blood pressure 128/70, pulse 100, temperature 96.8 F (36 C), temperature source Temporal, resp. rate 20, height 6' (1.829 m), weight 274 lb (124.286 kg).  Physical Exam Physical Exam  Constitutional: He is oriented to person, place, and time. He appears well-developed and well-nourished.  HENT:  Head: Normocephalic and atraumatic.  Eyes: Conjunctivae and EOM are normal. Pupils are equal, round, and reactive to light.  Neck: Normal range of motion. Neck supple.  Cardiovascular: Normal rate, regular rhythm and normal heart sounds.   Pulmonary/Chest: Effort normal and breath sounds normal.  Abdominal: Soft. Bowel sounds are normal.  Genitourinary:  There is an opening in the left anterior perirectal space. There is a palpable  cord heading towards the rectum. There is some purulence that is able to be expressed from the tract.  Musculoskeletal: Normal range of motion.  Neurological: He is alert and oriented to person, place, and time.  Skin: Skin is warm and dry.  Psychiatric: He has a normal mood and affect. His behavior is normal.    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     Plan    At this point I would recommend that he see Dr. Maisie Fus who is our colorectal specialist for possible fistulotomy versus seton and advancement flap. He does not want to have anything done at this point in time. He will call back when he is ready to be evaluated by Dr. Babette Relic III,PAUL S 12/10/2012, 2:22 PM

## 2013-01-14 ENCOUNTER — Ambulatory Visit (INDEPENDENT_AMBULATORY_CARE_PROVIDER_SITE_OTHER): Payer: Medicare Other | Admitting: Cardiology

## 2013-01-14 ENCOUNTER — Encounter: Payer: Self-pay | Admitting: Cardiology

## 2013-01-14 VITALS — BP 142/69 | HR 98 | Ht 72.0 in | Wt 273.0 lb

## 2013-01-14 DIAGNOSIS — R072 Precordial pain: Secondary | ICD-10-CM

## 2013-01-14 DIAGNOSIS — I10 Essential (primary) hypertension: Secondary | ICD-10-CM

## 2013-01-14 DIAGNOSIS — E782 Mixed hyperlipidemia: Secondary | ICD-10-CM

## 2013-01-14 NOTE — Progress Notes (Signed)
Clinical Summary [name] is a 58 y.o.male last seen in July 2014. He reports no exertional chest pain symptoms are progressive shortness of breath. Main limitation is related to significant bilateral arthritic knee pain, also some right hip pain. This is currently preventing him from getting any regular exercise. He has undergone joint injections, will most likely,come to knee replacement surgeries at some point by his report.  Medications are reviewed below. He remains on a reasonable regimen and has followup for his diabetes and lipids soon.   No Known Allergies  Current Outpatient Prescriptions  Medication Sig Dispense Refill  . amoxicillin-clavulanate (AUGMENTIN) 875-125 MG per tablet Take 1 tablet by mouth 2 (two) times daily. Prn for flare       . aspirin 81 MG tablet Take 81 mg by mouth daily.        Marland Kitchen escitalopram (LEXAPRO) 10 MG tablet Take 10 mg by mouth daily.      Marland Kitchen HYDROcodone-acetaminophen (VICODIN) 5-500 MG per tablet Take 1 tablet by mouth every 6 (six) hours as needed.        . INSULIN REGULAR HUMAN (HUMULIN R) 500 UNIT/ML SOLN Inject into the skin. Sliding scale       . lisinopril (PRINIVIL,ZESTRIL) 40 MG tablet Take 1 tablet (40 mg total) by mouth daily.  90 tablet  3  . metFORMIN (GLUCOPHAGE) 1000 MG tablet Take 1,000 mg by mouth 2 (two) times daily with a meal.       . metoprolol succinate (TOPROL-XL) 50 MG 24 hr tablet Take 1 tablet (50 mg total) by mouth daily.  90 tablet  3  . omeprazole (PRILOSEC) 20 MG capsule Take 1 capsule (20 mg total) by mouth daily.  90 capsule  1  . pravastatin (PRAVACHOL) 80 MG tablet Take 1 tablet (80 mg total) by mouth daily.  90 tablet  3   No current facility-administered medications for this visit.    Past Medical History  Diagnosis Date  . Type 2 diabetes mellitus   . Essential hypertension, benign   . Chest tightness     ETT-Myoview 4/12: EF 58%, no scar or ischemia  . Mixed hyperlipidemia   . Neuropathy   . Myositis     . Anxiety     Social History [name] reports that he has never smoked. He does not have any smokeless tobacco history on file. [name] reports that he does not drink alcohol.  Review of Systems No palpitations or syncope. Occasionally has ankle edema. Reflux symptoms. Otherwise negative except as outlined.  Physical Examination Filed Vitals:   01/14/13 0812  BP: 142/69  Pulse: 98   Filed Weights   01/14/13 0812  Weight: 273 lb (123.832 kg)    Obese male, comfortable at rest.  HEENT: Conjunctiva and lids normal, oropharynx clear.  Neck: Supple, no elevated JVP or carotid bruits, no thyromegaly.  Lungs: Clear to auscultation, nonlabored breathing at rest.  Cardiac: Regular rate and rhythm, no S3 or significant systolic murmur, no pericardial rub.  Abdomen: Soft, nontender, bowel sounds present.  Extremities: No pitting edema, distal pulses 2+.  Skin: Warm and dry.  Musculoskeletal: No kyphosis.  Neuropsychiatric: Alert and oriented x3, affect grossly appropriate.   Problem List and Plan   Essential hypertension, benign Continue current regimen and keep followup with Dr. Hilma Mcdowell.  Mixed hyperlipidemia Patient states he is due for followup lab work soon, he continues on Pravachol.  Precordial pain This has not been a recurrent problem of late.  Reassuring Myoview from April 2012.    Satira Sark, M.D., F.A.C.C.

## 2013-01-14 NOTE — Assessment & Plan Note (Signed)
Patient states he is due for followup lab work soon, he continues on Pravachol.

## 2013-01-14 NOTE — Assessment & Plan Note (Signed)
Continue current regimen and keep followup with Dr. Hilma Favors.

## 2013-01-14 NOTE — Patient Instructions (Signed)
Your physician wants you to follow-up in:  6 months. You will receive a reminder letter in the mail two months in advance. If you don't receive a letter, please call our office to schedule the follow-up appointment.   

## 2013-01-14 NOTE — Assessment & Plan Note (Signed)
This has not been a recurrent problem of late. Reassuring Myoview from April 2012.

## 2013-02-24 ENCOUNTER — Other Ambulatory Visit: Payer: Self-pay | Admitting: Cardiology

## 2013-03-23 ENCOUNTER — Encounter: Payer: Self-pay | Admitting: Adult Health

## 2013-03-23 ENCOUNTER — Ambulatory Visit (INDEPENDENT_AMBULATORY_CARE_PROVIDER_SITE_OTHER): Payer: Medicare Other | Admitting: Adult Health

## 2013-03-23 VITALS — BP 141/62 | HR 88 | Ht 72.0 in | Wt 277.0 lb

## 2013-03-23 DIAGNOSIS — E1140 Type 2 diabetes mellitus with diabetic neuropathy, unspecified: Secondary | ICD-10-CM

## 2013-03-23 DIAGNOSIS — IMO0002 Reserved for concepts with insufficient information to code with codable children: Secondary | ICD-10-CM

## 2013-03-23 DIAGNOSIS — E1165 Type 2 diabetes mellitus with hyperglycemia: Secondary | ICD-10-CM

## 2013-03-23 DIAGNOSIS — I10 Essential (primary) hypertension: Secondary | ICD-10-CM

## 2013-03-23 DIAGNOSIS — E1142 Type 2 diabetes mellitus with diabetic polyneuropathy: Secondary | ICD-10-CM

## 2013-03-23 DIAGNOSIS — E1149 Type 2 diabetes mellitus with other diabetic neurological complication: Secondary | ICD-10-CM

## 2013-03-23 MED ORDER — FUROSEMIDE 20 MG PO TABS: 20.0000 mg | ORAL_TABLET | ORAL | Status: DC | PRN

## 2013-03-23 NOTE — Assessment & Plan Note (Addendum)
Blood pressure is elevated today compared to prior low pressures in December. Rising from 120/70-140 1/62. His weight has not significantly elevated. Last weight was 274, with a per weight 277. He states he has gained 8 pounds. I will continue him on the Cipro 40 mg daily, metoprolol 50 mg daily. Our pedis echocardiogram for evaluation LVEF in the setting of fluid retention.   He is given Lasix 20 mg to use when necessary fluid retention. He is advised to watch salt intake. Followup BMET and hemoglobin A1c will be ordered.

## 2013-03-23 NOTE — Assessment & Plan Note (Signed)
He is advised to followup with endocrinologist concerning blood glucose control. He states he was out of control during time and dizzy world. Likely related to dietary noncompliance.

## 2013-03-23 NOTE — Progress Notes (Signed)
HPI: [name] is a 58 year old patient of Dr. Domenic Polite, we are following for ongoing assessment and management of hypertension with history of hyper lipidemia precordial chest pain. Unless listed in January of 2015 the patient had progressive symptoms of shortness of breath. Other history includes diabetes, neuropathy, and anxiety.   On last visit with Dr. Domenic Polite the patient's medication regimen was continued and he was to followup with Dr. Hilma Favors. The patient had a reassuring Myoview in April 2012 and no further cardiac testing was planned.   He comes today after being in [place] for the last week, he states that he had significant fluid retention, his weight is up 10 pounds, along with PND and orthopnea, he also noted some wheezing. He felt a sharp pain in his chest. He is a diabetic and has an insulin pump but states that his blood glucose has been elevated. He admits to eating foods from the [facility] and was not managing his salt or sugar intake during that time.   No Known Allergies  Current Outpatient Prescriptions  Medication Sig Dispense Refill  . amoxicillin-clavulanate (AUGMENTIN) 875-125 MG per tablet Take 1 tablet by mouth 2 (two) times daily. Prn for flare       . aspirin 81 MG tablet Take 81 mg by mouth daily.        Marland Kitchen escitalopram (LEXAPRO) 10 MG tablet Take 10 mg by mouth daily.      Marland Kitchen HYDROcodone-acetaminophen (VICODIN) 5-500 MG per tablet Take 1 tablet by mouth every 6 (six) hours as needed.        . insulin regular (NOVOLIN R,HUMULIN R) 100 units/mL injection Inject into the skin 3 (three) times daily before meals.      . insulin regular human CONCENTRATED (HUMULIN R) 500 UNIT/ML SOLN injection Inject into the skin.      Marland Kitchen lisinopril (PRINIVIL,ZESTRIL) 40 MG tablet Take 1 tablet (40 mg total) by mouth daily.  90 tablet  3  . metFORMIN (GLUCOPHAGE) 1000 MG tablet Take 1,000 mg by mouth 2 (two) times daily with a meal.       . metoprolol succinate  (TOPROL-XL) 50 MG 24 hr tablet Take 1 tablet (50 mg total) by mouth daily.  90 tablet  3  . omeprazole (PRILOSEC) 20 MG capsule TAKE ONE CAPSULE BY MOUTH ONCE DAILY  90 capsule  6  . furosemide (LASIX) 20 MG tablet Take 1 tablet (20 mg total) by mouth as needed. For fluid retention  30 tablet  1  . pravastatin (PRAVACHOL) 80 MG tablet Take 1 tablet (80 mg total) by mouth daily.  90 tablet  3   No current facility-administered medications for this visit.    Past Medical History  Diagnosis Date  . Type 2 diabetes mellitus   . Essential hypertension, benign   . Chest tightness     ETT-Myoview 4/12: EF 58%, no scar or ischemia  . Mixed hyperlipidemia   . Neuropathy   . Myositis   . Anxiety     Past Surgical History  Procedure Laterality Date  . Carpal tunnel release    . Back surgery      GHW:EXHBZJ of systems complete and found to be negative unless listed above  PHYSICAL EXAM BP 141/62  Pulse 88  Ht 6' (1.829 m)  Wt 277 lb (125.646 kg)  BMI 37.56 kg/m2  General: Well developed, well nourished, in no acute distress Head: Eyes PERRLA, No xanthomas.   Normal cephalic and atramatic  Lungs: Clear bilaterally , mild crackles in the right base, no wheezes or rhonchi Heart: HRRR S1 S2, without MRG.  Pulses are 2+ & equal.            No carotid bruit. No JVD.  No abdominal bruits. No femoral bruits. Abdomen: Bowel sounds are positive, abdomen obese, soft and non-tender without masses or                  Hernia's noted. Msk:  Back normal, normal gait. Normal strength and tone for age. Extremities: No clubbing, cyanosis or nonpitting edema.  DP +1 Neuro: Alert and oriented X 3. Psych:  Good affect, responds appropriately  EKG: Normal sinus rhythm, rate of 87 beats per minute.  ASSESSMENT AND PLAN

## 2013-03-23 NOTE — Patient Instructions (Signed)
Your physician recommends that you schedule a follow-up appointment in: 2-3 weeks  Your physician has requested that you have an echocardiogram. Echocardiography is a painless test that uses sound waves to create images of your heart. It provides your doctor with information about the size and shape of your heart and how well your heart's chambers and valves are working. This procedure takes approximately one hour. There are no restrictions for this procedure.   Your physician has recommended you make the following change in your medication:  Take Lasix 20 mg as needed for fluid retention  Your physician recommends that you return for lab work today. BMET, Hgb A1C

## 2013-03-23 NOTE — Progress Notes (Deleted)
Name: David Mcdowell    DOB: 1955-12-03  Age: 59 y.o.  MR#: [id number]       PCP:  Purvis Kilts, MD      Insurance: Payor: Onnie Boer MEDICARE / Plan: AARP MEDICARE COMPLETE / Product Type: *No Product type* /   CC:    Chief Complaint  Patient presents with  . Hypertension  . Hyperlipidemia    VS Filed Vitals:   03/23/13 1446  BP: 141/62  Pulse: 88  Height: 6' (1.829 m)  Weight: 277 lb (125.646 kg)    Weights Current Weight  03/23/13 277 lb (125.646 kg)  01/14/13 273 lb (123.832 kg)  12/10/12 274 lb (124.286 kg)    Blood Pressure  BP Readings from Last 3 Encounters:  03/23/13 141/62  01/14/13 142/69  12/10/12 128/70     Admit date:  (Not on file) Last encounter with RMR:  Visit date not found   Allergy Review of patient's allergies indicates no known allergies.  Current Outpatient Prescriptions  Medication Sig Dispense Refill  . amoxicillin-clavulanate (AUGMENTIN) 875-125 MG per tablet Take 1 tablet by mouth 2 (two) times daily. Prn for flare       . aspirin 81 MG tablet Take 81 mg by mouth daily.        Marland Kitchen escitalopram (LEXAPRO) 10 MG tablet Take 10 mg by mouth daily.      Marland Kitchen HYDROcodone-acetaminophen (VICODIN) 5-500 MG per tablet Take 1 tablet by mouth every 6 (six) hours as needed.        . insulin regular (NOVOLIN R,HUMULIN R) 100 units/mL injection Inject into the skin 3 (three) times daily before meals.      . insulin regular human CONCENTRATED (HUMULIN R) 500 UNIT/ML SOLN injection Inject into the skin.      Marland Kitchen lisinopril (PRINIVIL,ZESTRIL) 40 MG tablet Take 1 tablet (40 mg total) by mouth daily.  90 tablet  3  . metFORMIN (GLUCOPHAGE) 1000 MG tablet Take 1,000 mg by mouth 2 (two) times daily with a meal.       . metoprolol succinate (TOPROL-XL) 50 MG 24 hr tablet Take 1 tablet (50 mg total) by mouth daily.  90 tablet  3  . omeprazole (PRILOSEC) 20 MG capsule TAKE ONE CAPSULE BY MOUTH ONCE DAILY  90 capsule  6  . pravastatin (PRAVACHOL) 80 MG tablet  Take 1 tablet (80 mg total) by mouth daily.  90 tablet  3   No current facility-administered medications for this visit.    Discontinued Meds:    Medications Discontinued During This Encounter  Medication Reason  . INSULIN REGULAR HUMAN (HUMULIN R) 500 UNIT/ML SOLN Error    Patient Active Problem List   Diagnosis Date Noted  . 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 Cyst of buttocks 11/25/2012  . Mixed hyperlipidemia 07/08/2012  . Dyspepsia 07/08/2012  . Precordial pain 05/04/2010  . Type 2 diabetes mellitus, uncontrolled, with neuropathy 05/04/2010  . Essential hypertension, benign 05/04/2010    LABS    Component Value Date/Time   NA 139 05/01/2011 0853   NA 141 12/27/2010 0950   NA 141 08/31/2010 1034   K 3.9 05/01/2011 0853   K 4.3 12/27/2010 0950   K 4.6 08/31/2010 1034   CL 104 05/01/2011 0853   CL 108 12/27/2010 0950   CL 105 08/31/2010 1034   CO2 24 05/01/2011 0853   CO2 25 12/27/2010 0950   CO2 26 09/28/2009 1159   GLUCOSE 162* 05/01/2011 0853   GLUCOSE 205* 12/27/2010  0950   GLUCOSE 216* 08/31/2010 1034   BUN 14 05/01/2011 0853   BUN 12 12/27/2010 0950   BUN 17 08/31/2010 1034   CREATININE 0.8 05/01/2011 0853   CREATININE 0.9 12/27/2010 0950   CREATININE 0.80 08/31/2010 1034   CALCIUM 9.3 05/01/2011 0853   CALCIUM 8.7 12/27/2010 0950   CALCIUM 8.9 09/28/2009 1159   GFRNONAA >60 09/28/2009 1159   GFRNONAA >60 01/14/2008 0930   GFRAA  Value: >60        The eGFR has been calculated using the MDRD equation. This calculation has not been validated in all clinical situations. eGFR's persistently <60 mL/min signify possible Chronic Kidney Disease. 09/28/2009 1159   GFRAA  Value: >60        The eGFR has been calculated using the MDRD equation. This calculation has not been validated in all clinical situations. eGFR's persistently <60 mL/min signify possible Chronic Kidney Disease. 01/14/2008 0930   CMP     Component Value Date/Time   NA 139 05/01/2011 0853   K 3.9 05/01/2011 0853   CL 104  05/01/2011 0853   CO2 24 05/01/2011 0853   GLUCOSE 162* 05/01/2011 0853   BUN 14 05/01/2011 0853   CREATININE 0.8 05/01/2011 0853   CALCIUM 9.3 05/01/2011 0853   PROT 7.2 10/24/2011 1044   ALBUMIN 3.8 10/24/2011 1044   AST 37 10/24/2011 1044   ALT 49 10/24/2011 1044   ALKPHOS 49 10/24/2011 1044   BILITOT 0.7 10/24/2011 1044   GFRNONAA >60 09/28/2009 1159   GFRAA  Value: >60        The eGFR has been calculated using the MDRD equation. This calculation has not been validated in all clinical situations. eGFR's persistently <60 mL/min signify possible Chronic Kidney Disease. 09/28/2009 1159       Component Value Date/Time   HGB 17.0 08/31/2010 1034   HGB 14.6 09/30/2009 0707   HGB 16.0 01/15/2008 0913   HCT 50.0 08/31/2010 1034    Lipid Panel     Component Value Date/Time   CHOL 175 06/13/2011 0833   TRIG 161.0* 06/13/2011 0833   HDL 48.80 06/13/2011 0833   CHOLHDL 4 06/13/2011 0833   VLDL 32.2 06/13/2011 0833   LDLCALC 94 06/13/2011 0833    ABG    Component Value Date/Time   TCO2 24 08/31/2010 1034     No results found for this basename: TSH   BNP (last 3 results) No results found for this basename: PROBNP,  in the last 8760 hours Cardiac Panel (last 3 results) No results found for this basename: CKTOTAL, CKMB, TROPONINI, RELINDX,  in the last 72 hours  Iron/TIBC/Ferritin No results found for this basename: iron, tibc, ferritin     EKG Orders placed in visit on 03/18/12  . EKG 12-LEAD     Prior Assessment and Plan Problem List as of 03/23/2013     Cardiovascular and Mediastinum   Essential hypertension, benign   Last Assessment & Plan   01/14/2013 Office Visit Written 01/14/2013  8:30 AM by Satira Sark, MD     Continue current regimen and keep followup with Dr. Hilma Favors.      Digestive   Anal fistula     Endocrine   Type 2 diabetes mellitus, uncontrolled, with neuropathy   Last Assessment & Plan   11/25/2012 Office Visit Written 11/25/2012  2:04 PM by Campbell Riches, MD      He will cont to f/u with his PCP. I cautioned him that better control  would help his healing.       Musculoskeletal and Integument   Cyst of buttocks   Last Assessment & Plan   11/25/2012 Office Visit Written 11/25/2012  2:08 PM by Campbell Riches, MD     Not clear what the etiology of this is. It appears to be chronic, could query if this is a fistula? Seems unlikely as it is currently not draining.  I have asked him to f/u with Dr Marlou Starks, the surgeon who has previously evaluated him for this prior. I am not sure (but am hopeful?) that a definitve procedure could be done. Given his history, this is unlikely to preclude recurrences. He does not currently need anbx.   Will see him back prn.       Other   Precordial pain   Last Assessment & Plan   01/14/2013 Office Visit Written 01/14/2013  8:31 AM by Satira Sark, MD     This has not been a recurrent problem of late. Reassuring Myoview from April 2012.    Mixed hyperlipidemia   Last Assessment & Plan   01/14/2013 Office Visit Written 01/14/2013  8:31 AM by Satira Sark, MD     Patient states he is due for followup lab work soon, he continues on Pravachol.    Dyspepsia       Imaging: No results found.

## 2013-03-24 LAB — BASIC METABOLIC PANEL
BUN: 9 mg/dL (ref 6–23)
CALCIUM: 9 mg/dL (ref 8.4–10.5)
CO2: 24 mEq/L (ref 19–32)
CREATININE: 0.84 mg/dL (ref 0.50–1.35)
Chloride: 105 mEq/L (ref 96–112)
GLUCOSE: 225 mg/dL — AB (ref 70–99)
Potassium: 4.3 mEq/L (ref 3.5–5.3)
SODIUM: 139 meq/L (ref 135–145)

## 2013-03-24 LAB — HEMOGLOBIN A1C
HEMOGLOBIN A1C: 11.4 % — AB (ref <5.7)
Mean Plasma Glucose: 280 mg/dL — ABNORMAL HIGH (ref <117)

## 2013-03-30 ENCOUNTER — Ambulatory Visit (HOSPITAL_COMMUNITY)
Admission: RE | Admit: 2013-03-30 | Discharge: 2013-03-30 | Disposition: A | Discharge status: Home / Self Care | Payer: Medicare Other | Source: Ambulatory Visit | Attending: Family Medicine | Admitting: Family Medicine

## 2013-03-30 DIAGNOSIS — Z6837 Body mass index (BMI) 37.0-37.9, adult: Secondary | ICD-10-CM | POA: Insufficient documentation

## 2013-03-30 DIAGNOSIS — I10 Essential (primary) hypertension: Secondary | ICD-10-CM

## 2013-03-30 DIAGNOSIS — I517 Cardiomegaly: Secondary | ICD-10-CM | POA: Insufficient documentation

## 2013-03-30 DIAGNOSIS — E785 Hyperlipidemia, unspecified: Secondary | ICD-10-CM | POA: Insufficient documentation

## 2013-03-30 DIAGNOSIS — E119 Type 2 diabetes mellitus without complications: Secondary | ICD-10-CM | POA: Insufficient documentation

## 2013-03-30 NOTE — Progress Notes (Signed)
*  PRELIMINARY RESULTS* Echocardiogram 2D Echocardiogram has been performed.  Grawn, [place] 03/30/2013, 2:46 PM

## 2013-04-20 ENCOUNTER — Encounter: Payer: Self-pay | Admitting: Adult Health

## 2013-04-20 ENCOUNTER — Ambulatory Visit (INDEPENDENT_AMBULATORY_CARE_PROVIDER_SITE_OTHER): Payer: Medicare Other | Admitting: Adult Health

## 2013-04-20 VITALS — BP 105/60 | HR 109 | Ht 72.0 in | Wt 273.0 lb

## 2013-04-20 DIAGNOSIS — E1149 Type 2 diabetes mellitus with other diabetic neurological complication: Secondary | ICD-10-CM

## 2013-04-20 DIAGNOSIS — IMO0002 Reserved for concepts with insufficient information to code with codable children: Secondary | ICD-10-CM

## 2013-04-20 DIAGNOSIS — E1165 Type 2 diabetes mellitus with hyperglycemia: Secondary | ICD-10-CM

## 2013-04-20 DIAGNOSIS — E1142 Type 2 diabetes mellitus with diabetic polyneuropathy: Secondary | ICD-10-CM

## 2013-04-20 DIAGNOSIS — I10 Essential (primary) hypertension: Secondary | ICD-10-CM

## 2013-04-20 DIAGNOSIS — E1140 Type 2 diabetes mellitus with diabetic neuropathy, unspecified: Secondary | ICD-10-CM

## 2013-04-20 NOTE — Progress Notes (Deleted)
Name: David Mcdowell    DOB: 01/23/1955  Age: 58 y.o.  MR#: [id number]       PCP:  Purvis Kilts, MD      Insurance: Payor: Onnie Boer MEDICARE / Plan: AARP MEDICARE COMPLETE / Product Type: *No Product type* /   CC:    Chief Complaint  Patient presents with  . Hypertension    VS Filed Vitals:   04/20/13 1415  BP: 105/60  Pulse: 109  Height: 6' (1.829 m)  Weight: 273 lb (123.832 kg)    Weights Current Weight  04/20/13 273 lb (123.832 kg)  03/23/13 277 lb (125.646 kg)  01/14/13 273 lb (123.832 kg)    Blood Pressure  BP Readings from Last 3 Encounters:  04/20/13 105/60  03/23/13 141/62  01/14/13 142/69     Admit date:  (Not on file) Last encounter with RMR:  03/23/2013   Allergy Review of patient's allergies indicates no known allergies.  Current Outpatient Prescriptions  Medication Sig Dispense Refill  . amoxicillin-clavulanate (AUGMENTIN) 875-125 MG per tablet Take 1 tablet by mouth 2 (two) times daily. Prn for flare       . aspirin 81 MG tablet Take 81 mg by mouth daily.        Marland Kitchen escitalopram (LEXAPRO) 10 MG tablet Take 10 mg by mouth daily.      . furosemide (LASIX) 20 MG tablet Take 1 tablet (20 mg total) by mouth as needed. For fluid retention  30 tablet  1  . HYDROcodone-acetaminophen (VICODIN) 5-500 MG per tablet Take 1 tablet by mouth every 6 (six) hours as needed.        . insulin regular (NOVOLIN R,HUMULIN R) 100 units/mL injection Inject into the skin 3 (three) times daily before meals.      . insulin regular human CONCENTRATED (HUMULIN R) 500 UNIT/ML SOLN injection Inject into the skin.      Marland Kitchen lisinopril (PRINIVIL,ZESTRIL) 40 MG tablet Take 1 tablet (40 mg total) by mouth daily.  90 tablet  3  . meloxicam (MOBIC) 15 MG tablet Take 15 mg by mouth daily.       . metFORMIN (GLUCOPHAGE) 1000 MG tablet Take 1,000 mg by mouth 2 (two) times daily with a meal.       . metoprolol succinate (TOPROL-XL) 50 MG 24 hr tablet Take 1 tablet (50 mg total) by mouth  daily.  90 tablet  3  . omeprazole (PRILOSEC) 20 MG capsule TAKE ONE CAPSULE BY MOUTH ONCE DAILY  90 capsule  6  . pravastatin (PRAVACHOL) 80 MG tablet Take 80 mg by mouth daily.       No current facility-administered medications for this visit.    Discontinued Meds:    Medications Discontinued During This Encounter  Medication Reason  . pravastatin (PRAVACHOL) 80 MG tablet Error    Patient Active Problem List   Diagnosis Date Noted  . 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 Cyst of buttocks 11/25/2012  . Mixed hyperlipidemia 07/08/2012  . Dyspepsia 07/08/2012  . Precordial pain 05/04/2010  . Type 2 diabetes mellitus, uncontrolled, with neuropathy 05/04/2010  . Essential hypertension, benign 05/04/2010    LABS    Component Value Date/Time   NA 139 03/23/2013 1605   NA 139 05/01/2011 0853   NA 141 12/27/2010 0950   K 4.3 03/23/2013 1605   K 3.9 05/01/2011 0853   K 4.3 12/27/2010 0950   CL 105 03/23/2013 1605   CL 104 05/01/2011 0853   CL 108 12/27/2010  0950   CO2 24 03/23/2013 1605   CO2 24 05/01/2011 0853   CO2 25 12/27/2010 0950   GLUCOSE 225* 03/23/2013 1605   GLUCOSE 162* 05/01/2011 0853   GLUCOSE 205* 12/27/2010 0950   BUN 9 03/23/2013 1605   BUN 14 05/01/2011 0853   BUN 12 12/27/2010 0950   CREATININE 0.84 03/23/2013 1605   CREATININE 0.8 05/01/2011 0853   CREATININE 0.9 12/27/2010 0950   CREATININE 0.80 08/31/2010 1034   CALCIUM 9.0 03/23/2013 1605   CALCIUM 9.3 05/01/2011 0853   CALCIUM 8.7 12/27/2010 0950   GFRNONAA >60 09/28/2009 1159   GFRNONAA >60 01/14/2008 0930   GFRAA  Value: >60        The eGFR has been calculated using the MDRD equation. This calculation has not been validated in all clinical situations. eGFR's persistently <60 mL/min signify possible Chronic Kidney Disease. 09/28/2009 1159   GFRAA  Value: >60        The eGFR has been calculated using the MDRD equation. This calculation has not been validated in all clinical situations. eGFR's persistently <60 mL/min signify  possible Chronic Kidney Disease. 01/14/2008 0930   CMP     Component Value Date/Time   NA 139 03/23/2013 1605   K 4.3 03/23/2013 1605   CL 105 03/23/2013 1605   CO2 24 03/23/2013 1605   GLUCOSE 225* 03/23/2013 1605   BUN 9 03/23/2013 1605   CREATININE 0.84 03/23/2013 1605   CREATININE 0.8 05/01/2011 0853   CALCIUM 9.0 03/23/2013 1605   PROT 7.2 10/24/2011 1044   ALBUMIN 3.8 10/24/2011 1044   AST 37 10/24/2011 1044   ALT 49 10/24/2011 1044   ALKPHOS 49 10/24/2011 1044   BILITOT 0.7 10/24/2011 1044   GFRNONAA >60 09/28/2009 1159   GFRAA  Value: >60        The eGFR has been calculated using the MDRD equation. This calculation has not been validated in all clinical situations. eGFR's persistently <60 mL/min signify possible Chronic Kidney Disease. 09/28/2009 1159       Component Value Date/Time   HGB 17.0 08/31/2010 1034   HGB 14.6 09/30/2009 0707   HGB 16.0 01/15/2008 0913   HCT 50.0 08/31/2010 1034    Lipid Panel     Component Value Date/Time   CHOL 175 06/13/2011 0833   TRIG 161.0* 06/13/2011 0833   HDL 48.80 06/13/2011 0833   CHOLHDL 4 06/13/2011 0833   VLDL 32.2 06/13/2011 0833   LDLCALC 94 06/13/2011 0833    ABG    Component Value Date/Time   TCO2 24 08/31/2010 1034     No results found for this basename: TSH   BNP (last 3 results) No results found for this basename: PROBNP,  in the last 8760 hours Cardiac Panel (last 3 results) No results found for this basename: CKTOTAL, CKMB, TROPONINI, RELINDX,  in the last 72 hours  Iron/TIBC/Ferritin No results found for this basename: iron, tibc, ferritin     EKG Orders placed in visit on 03/23/13  . EKG 12-LEAD     Prior Assessment and Plan Problem List as of 04/20/2013     Cardiovascular and Mediastinum   Essential hypertension, benign   Last Assessment & Plan   03/23/2013 Office Visit Edited 03/23/2013  4:35 PM by Lendon Colonel, NP     Blood pressure is elevated today compared to prior low pressures in December. Rising from  120/70-140 1/62. His weight has not significantly elevated. Last weight was 274, with a per weight  277. He states he has gained 8 pounds. I will continue him on the Cipro 40 mg daily, metoprolol 50 mg daily. Our pedis echocardiogram for evaluation LVEF in the setting of fluid retention.   He is given Lasix 20 mg to use when necessary fluid retention. He is advised to watch salt intake. Followup BMET and hemoglobin A1c will be ordered.      Digestive   Anal fistula     Endocrine   Type 2 diabetes mellitus, uncontrolled, with neuropathy   Last Assessment & Plan   03/23/2013 Office Visit Written 03/23/2013  4:36 PM by Lendon Colonel, NP     He is advised to followup with endocrinologist concerning blood glucose control. He states he was out of control during time and dizzy world. Likely related to dietary noncompliance.      Musculoskeletal and Integument   Cyst of buttocks   Last Assessment & Plan   11/25/2012 Office Visit Written 11/25/2012  2:08 PM by Campbell Riches, MD     Not clear what the etiology of this is. It appears to be chronic, could query if this is a fistula? Seems unlikely as it is currently not draining.  I have asked him to f/u with Dr Marlou Starks, the surgeon who has previously evaluated him for this prior. I am not sure (but am hopeful?) that a definitve procedure could be done. Given his history, this is unlikely to preclude recurrences. He does not currently need anbx.   Will see him back prn.       Other   Precordial pain   Last Assessment & Plan   01/14/2013 Office Visit Written 01/14/2013  8:31 AM by Satira Sark, MD     This has not been a recurrent problem of late. Reassuring Myoview from April 2012.    Mixed hyperlipidemia   Last Assessment & Plan   01/14/2013 Office Visit Written 01/14/2013  8:31 AM by Satira Sark, MD     Patient states he is due for followup lab work soon, he continues on Pravachol.    Dyspepsia       Imaging: No results  found.

## 2013-04-20 NOTE — Patient Instructions (Signed)
Your physician recommends that you schedule a follow-up appointment in: 3-4 months with Dr Domenic Polite   Your physician has recommended you make the following change in your medication:  STOP LASIX

## 2013-04-20 NOTE — Assessment & Plan Note (Signed)
He is hypotensive today. He is taking the lasix DAILY instead of what I advised at PRN. He says he feels better with the fluid off, but there was no evidence of fluid retention on last visit. I will d/c the lasix. He will take the remaining lasix PRN, but I will offer no refills on this medication. He is to avoid salty foods and snacks, that he admits to eating. See Dr Domenic Polite in 4 months.

## 2013-04-20 NOTE — Assessment & Plan Note (Signed)
He adjusts his own insulin pump to cover BG elevations. Hgb A1C is very high at 11.4. He does not watch his diet very well. He is followed by Dr. Chalmers Cater endocrinologist. He is advised to follow a diabetic diet.

## 2013-04-20 NOTE — Addendum Note (Signed)
Addended by: Truett Mainland on: 04/20/2013 02:48 PM   Modules accepted: Orders

## 2013-04-20 NOTE — Progress Notes (Signed)
HPI: [name] is a 58 year old patient of Dr. Domenic Polite her following for ongoing assessment and management of hypertension with history of hyperlipidemia, precordial chest pain. Patient was last seen in the office in March of 2015 complaints of fluid retention weight up 10 pounds after visiting [place] the week prior. He also had some sharp pain in his chest. He also states his blood glucose has been elevated even though he has an insulin pump. He believes is related to overeating while on vacation.   On last visit the patient was continued on metoprolol 50 mg daily. Echocardiogram was repeated. He was given Lasix 20 mg to use ER and for fluid retention. A followup BMET and hemoglobin A1c was ordered.   Hemoglobin A1c was 11.4. Sodium 139 potassium 4.3 chloride 105 CO2 24 BUN 9 creatinine 0.84 glucose 280. Echocardiogram dated 03/30/2013 reveals:  Left ventricle: The cavity size was normal. Systolic function was normal. The estimated ejection fraction was in the range of 55% to 60%. Doppler parameters are consistent with abnormal left ventricular relaxation (grade 1 diastolic dysfunction). Mild to moderate left ventricular hypertrophy. - Aortic valve: Mildly thickened, mildly calcified leaflets. There was no stenosis. - Mitral valve: Calcified annulus. Mildly thickened leaflets . No significant regurgitation.    No Known Allergies  Current Outpatient Prescriptions  Medication Sig Dispense Refill  . amoxicillin-clavulanate (AUGMENTIN) 875-125 MG per tablet Take 1 tablet by mouth 2 (two) times daily. Prn for flare       . aspirin 81 MG tablet Take 81 mg by mouth daily.        Marland Kitchen escitalopram (LEXAPRO) 10 MG tablet Take 10 mg by mouth daily.      . furosemide (LASIX) 20 MG tablet Take 1 tablet (20 mg total) by mouth as needed. For fluid retention  30 tablet  1  . HYDROcodone-acetaminophen (VICODIN) 5-500 MG per tablet Take 1 tablet by mouth every 6 (six) hours as needed.        .  insulin regular (NOVOLIN R,HUMULIN R) 100 units/mL injection Inject into the skin 3 (three) times daily before meals.      . insulin regular human CONCENTRATED (HUMULIN R) 500 UNIT/ML SOLN injection Inject into the skin.      Marland Kitchen lisinopril (PRINIVIL,ZESTRIL) 40 MG tablet Take 1 tablet (40 mg total) by mouth daily.  90 tablet  3  . meloxicam (MOBIC) 15 MG tablet Take 15 mg by mouth daily.       . metFORMIN (GLUCOPHAGE) 1000 MG tablet Take 1,000 mg by mouth 2 (two) times daily with a meal.       . metoprolol succinate (TOPROL-XL) 50 MG 24 hr tablet Take 1 tablet (50 mg total) by mouth daily.  90 tablet  3  . omeprazole (PRILOSEC) 20 MG capsule TAKE ONE CAPSULE BY MOUTH ONCE DAILY  90 capsule  6  . pravastatin (PRAVACHOL) 80 MG tablet Take 80 mg by mouth daily.       No current facility-administered medications for this visit.    Past Medical History  Diagnosis Date  . Type 2 diabetes mellitus   . Essential hypertension, benign   . Chest tightness     ETT-Myoview 4/12: EF 58%, no scar or ischemia  . Mixed hyperlipidemia   . Neuropathy   . Myositis   . Anxiety     Past Surgical History  Procedure Laterality Date  . Carpal tunnel release    . Back surgery  ROS:  Review of systems complete and found to be negative unless listed above  PHYSICAL EXAM BP 105/60  Pulse 109  Ht 6' (1.829 m)  Wt 273 lb (123.832 kg)  BMI 37.02 kg/m2 Review of systems complete and found to be negative unless listed above General: Well developed, well nourished, in no acute distress Head: Eyes PERRLA, No xanthomas.   Normal cephalic and atramatic  Lungs: Clear bilaterally to auscultation and percussion. Heart: HRRR S1 S2, without MRG.  Pulses are 2+ & equal.            No carotid bruit. No JVD.  No abdominal bruits. No femoral bruits. Abdomen: Bowel sounds are positive, abdomen soft and non-tender without masses or                  Hernia's noted. Msk:  Back normal, normal gait. Normal strength and  tone for age. Extremities: No clubbing, cyanosis or edema.  DP +1 Neuro: Alert and oriented X 3. Psych:  Good affect, responds appropriately   ASSESSMENT AND PLAN

## 2013-05-20 ENCOUNTER — Other Ambulatory Visit: Payer: Self-pay | Admitting: Cardiology

## 2013-07-05 ENCOUNTER — Other Ambulatory Visit: Payer: Self-pay | Admitting: Cardiology

## 2013-09-01 ENCOUNTER — Telehealth: Payer: Self-pay | Admitting: Adult Health

## 2013-09-01 MED ORDER — PRAVASTATIN SODIUM 80 MG PO TABS: 80.0000 mg | ORAL_TABLET | Freq: Every day | ORAL | Status: DC

## 2013-09-01 NOTE — Telephone Encounter (Signed)
Received fax refill request  Rx # [id number] Medication:  Pravastatin 80 mg tab Qty 90 Sig:  Take one tablet by mouth once daily Physician:  Purcell Nails

## 2013-09-01 NOTE — Telephone Encounter (Signed)
Refill complete 

## 2013-09-23 ENCOUNTER — Telehealth: Payer: Self-pay | Admitting: Cardiology

## 2013-09-23 DIAGNOSIS — I10 Essential (primary) hypertension: Secondary | ICD-10-CM

## 2013-09-23 DIAGNOSIS — Z01810 Encounter for preprocedural cardiovascular examination: Secondary | ICD-10-CM

## 2013-09-23 MED ORDER — METOPROLOL SUCCINATE ER 50 MG PO TB24: 50.0000 mg | ORAL_TABLET | Freq: Every day | ORAL | Status: DC

## 2013-09-23 NOTE — Telephone Encounter (Signed)
Received fax refill request  Rx # [id number] Medication:  Metoprolol 50 mg ER tab Qty 90 Sig:  Take one tablet by mouth once daily Physician:  Domenic Polite

## 2013-09-23 NOTE — Telephone Encounter (Signed)
Medication sent to pharmacy  

## 2013-09-30 ENCOUNTER — Other Ambulatory Visit: Payer: Self-pay | Admitting: *Deleted

## 2013-09-30 DIAGNOSIS — I10 Essential (primary) hypertension: Secondary | ICD-10-CM

## 2013-09-30 DIAGNOSIS — Z01810 Encounter for preprocedural cardiovascular examination: Secondary | ICD-10-CM

## 2013-09-30 MED ORDER — METOPROLOL SUCCINATE ER 50 MG PO TB24: 50.0000 mg | ORAL_TABLET | Freq: Every day | ORAL | Status: DC

## 2013-10-22 ENCOUNTER — Encounter: Payer: Medicare Other | Admitting: Cardiology

## 2013-10-22 ENCOUNTER — Encounter: Payer: Self-pay | Admitting: Cardiology

## 2013-10-22 NOTE — Progress Notes (Signed)
No show  This encounter was created in error - please disregard.

## 2013-10-23 ENCOUNTER — Ambulatory Visit: Payer: Medicare Other | Admitting: Cardiology

## 2013-12-16 ENCOUNTER — Encounter: Payer: Medicare Other | Admitting: Cardiology

## 2013-12-16 ENCOUNTER — Encounter: Payer: Self-pay | Admitting: Cardiology

## 2013-12-16 NOTE — Progress Notes (Signed)
No show  This encounter was created in error - please disregard.

## 2013-12-18 ENCOUNTER — Encounter: Payer: Self-pay | Admitting: Cardiology

## 2013-12-18 ENCOUNTER — Ambulatory Visit (INDEPENDENT_AMBULATORY_CARE_PROVIDER_SITE_OTHER): Payer: Medicare Other | Admitting: Cardiology

## 2013-12-18 VITALS — BP 117/73 | HR 99 | Ht 72.0 in | Wt 272.0 lb

## 2013-12-18 DIAGNOSIS — I10 Essential (primary) hypertension: Secondary | ICD-10-CM

## 2013-12-18 DIAGNOSIS — E1140 Type 2 diabetes mellitus with diabetic neuropathy, unspecified: Secondary | ICD-10-CM

## 2013-12-18 DIAGNOSIS — E1165 Type 2 diabetes mellitus with hyperglycemia: Secondary | ICD-10-CM

## 2013-12-18 DIAGNOSIS — IMO0002 Reserved for concepts with insufficient information to code with codable children: Secondary | ICD-10-CM

## 2013-12-18 DIAGNOSIS — Z01810 Encounter for preprocedural cardiovascular examination: Secondary | ICD-10-CM

## 2013-12-18 MED ORDER — METOPROLOL SUCCINATE ER 50 MG PO TB24: 50.0000 mg | ORAL_TABLET | Freq: Every day | ORAL | Status: DC

## 2013-12-18 NOTE — Progress Notes (Signed)
   Reason for visit: Hypertension  Clinical Summary David Mcdowell is a 58 y.o.male last seen by Ms. Lawrence NP in April. He presents for a routine follow-up visit. 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 He has had no chest pain or unusual shortness of breath with typical ADLs. Also states that his blood pressure has been much better controlled. He continues on Toprol-XL and lisinopril.  Echocardiogram from March of this year showed mild to moderate LVH with LVEF 93-81%, grade 1 diastolic dysfunction, mildly sclerotic aortic valve.  Lab work from March showed potassium 4.3, BUN 9, creatinine 0.8.  Weight is stable compared to April. No orthopnea or PND.  No Known Allergies  Current Outpatient Prescriptions  Medication Sig Dispense Refill  . amoxicillin-clavulanate (AUGMENTIN) 875-125 MG per tablet Take 1 tablet by mouth 2 (two) times daily. Prn for flare     . aspirin 81 MG tablet Take 81 mg by mouth daily.      Marland Kitchen HYDROcodone-acetaminophen (VICODIN) 5-500 MG per tablet Take 1 tablet by mouth every 6 (six) hours as needed.      . insulin regular human CONCENTRATED (HUMULIN R) 500 UNIT/ML SOLN injection Inject into the skin.    Marland Kitchen lisinopril (PRINIVIL,ZESTRIL) 40 MG tablet TAKE ONE TABLET BY MOUTH EVERY DAY 90 tablet 3  . metFORMIN (GLUCOPHAGE) 1000 MG tablet Take 1,000 mg by mouth 2 (two) times daily with a meal.     . metoprolol succinate (TOPROL-XL) 50 MG 24 hr tablet Take 1 tablet (50 mg total) by mouth daily. 90 tablet 3  . omeprazole (PRILOSEC) 20 MG capsule TAKE ONE CAPSULE BY MOUTH ONCE DAILY 90 capsule 6  . pravastatin (PRAVACHOL) 80 MG tablet Take 1 tablet (80 mg total) by mouth daily. 90 tablet 3   No current facility-administered medications for this visit.    Past Medical History  Diagnosis Date  . Type 2 diabetes mellitus   . Essential hypertension, benign   . Chest tightness     ETT-Myoview  4/12: EF 58%, no scar or ischemia  . Mixed hyperlipidemia   . Neuropathy   . Myositis   . Anxiety     Social History Mr. Smalls reports that he has never smoked. He has never used smokeless tobacco. Mr. Knisley reports that he does not drink alcohol.  Review of Systems Complete review of systems negative except as otherwise outlined in the clinical summary and also the following. No palpitations or dizziness. Stable appetite.  Physical Examination Filed Vitals:   12/18/13 1402  BP: 117/73  Pulse: 99   Filed Weights   12/18/13 1402  Weight: 272 lb (123.378 kg)   Obese male, comfortable at rest.  HEENT: Conjunctiva and lids normal, oropharynx clear.  Neck: Supple, no elevated JVP or carotid bruits, no thyromegaly.  Lungs: Clear to auscultation, nonlabored breathing at rest.  Cardiac: Regular rate and rhythm, no S3 or significant systolic murmur, no pericardial rub.  Abdomen: Soft, nontender, bowel sounds present.  Extremities: No pitting edema, distal pulses 2+.    Problem List and Plan   Essential hypertension, benign Blood pressure is well controlled today. Refill given for Toprol-XL. Otherwise no changes. Follow-up in 6 months.  Type 2 diabetes mellitus, uncontrolled, with neuropathy Keep follow-up with endocrinology.    Satira Sark, M.D., F.A.C.C.

## 2013-12-18 NOTE — Patient Instructions (Signed)

## 2013-12-18 NOTE — Assessment & Plan Note (Signed)
Blood pressure is well controlled today. Refill given for Toprol-XL. Otherwise no changes. Follow-up in 6 months.

## 2013-12-18 NOTE — Assessment & Plan Note (Signed)
Keep follow up with endocrinology

## 2014-02-06 ENCOUNTER — Observation Stay (HOSPITAL_COMMUNITY)
Admission: EM | Admit: 2014-02-06 | Discharge: 2014-02-07 | Disposition: A | Discharge status: Home / Self Care | Payer: Medicare Other | Attending: General Surgery | Admitting: General Surgery

## 2014-02-06 ENCOUNTER — Emergency Department (HOSPITAL_COMMUNITY): Payer: Medicare Other | Admitting: Anesthesiology

## 2014-02-06 ENCOUNTER — Encounter (HOSPITAL_COMMUNITY): Admission: EM | Disposition: A | Discharge status: Home / Self Care | Payer: Self-pay | Source: Home / Self Care

## 2014-02-06 ENCOUNTER — Encounter (HOSPITAL_COMMUNITY): Payer: Self-pay | Admitting: Emergency Medicine

## 2014-02-06 DIAGNOSIS — I10 Essential (primary) hypertension: Secondary | ICD-10-CM | POA: Insufficient documentation

## 2014-02-06 DIAGNOSIS — E669 Obesity, unspecified: Secondary | ICD-10-CM | POA: Diagnosis not present

## 2014-02-06 DIAGNOSIS — Z794 Long term (current) use of insulin: Secondary | ICD-10-CM | POA: Diagnosis not present

## 2014-02-06 DIAGNOSIS — E782 Mixed hyperlipidemia: Secondary | ICD-10-CM | POA: Diagnosis not present

## 2014-02-06 DIAGNOSIS — F419 Anxiety disorder, unspecified: Secondary | ICD-10-CM | POA: Diagnosis not present

## 2014-02-06 DIAGNOSIS — Z6836 Body mass index (BMI) 36.0-36.9, adult: Secondary | ICD-10-CM | POA: Diagnosis not present

## 2014-02-06 DIAGNOSIS — Z7982 Long term (current) use of aspirin: Secondary | ICD-10-CM | POA: Insufficient documentation

## 2014-02-06 DIAGNOSIS — L02214 Cutaneous abscess of groin: Principal | ICD-10-CM | POA: Insufficient documentation

## 2014-02-06 DIAGNOSIS — E119 Type 2 diabetes mellitus without complications: Secondary | ICD-10-CM | POA: Insufficient documentation

## 2014-02-06 HISTORY — DX: Cutaneous abscess, unspecified: L02.91

## 2014-02-06 HISTORY — PX: INCISION AND DRAINAGE ABSCESS: SHX5864

## 2014-02-06 LAB — GLUCOSE, CAPILLARY
GLUCOSE-CAPILLARY: 227 mg/dL — AB (ref 70–99)
Glucose-Capillary: 202 mg/dL — ABNORMAL HIGH (ref 70–99)
Glucose-Capillary: 230 mg/dL — ABNORMAL HIGH (ref 70–99)

## 2014-02-06 LAB — POCT I-STAT 4, (NA,K, GLUC, HGB,HCT)
Glucose, Bld: 276 mg/dL — ABNORMAL HIGH (ref 70–99)
HCT: 45 % (ref 39.0–52.0)
Hemoglobin: 15.3 g/dL (ref 13.0–17.0)
Potassium: 4.2 mmol/L (ref 3.5–5.1)
Sodium: 141 mmol/L (ref 135–145)

## 2014-02-06 LAB — BASIC METABOLIC PANEL
Anion gap: 11 (ref 5–15)
BUN: 13 mg/dL (ref 6–23)
CALCIUM: 8.6 mg/dL (ref 8.4–10.5)
CO2: 20 mmol/L (ref 19–32)
CREATININE: 1.02 mg/dL (ref 0.50–1.35)
Chloride: 110 mmol/L (ref 96–112)
GFR calc non Af Amer: 80 mL/min — ABNORMAL LOW (ref >90)
Glucose, Bld: 243 mg/dL — ABNORMAL HIGH (ref 70–99)
Potassium: 4.9 mmol/L (ref 3.5–5.1)
Sodium: 141 mmol/L (ref 135–145)

## 2014-02-06 LAB — CBC
HCT: 48.4 % (ref 39.0–52.0)
HEMOGLOBIN: 15.3 g/dL (ref 13.0–17.0)
MCH: 28.9 pg (ref 26.0–34.0)
MCHC: 31.6 g/dL (ref 30.0–36.0)
MCV: 91.5 fL (ref 78.0–100.0)
Platelets: 218 10*3/uL (ref 150–400)
RBC: 5.29 MIL/uL (ref 4.22–5.81)
RDW: 13 % (ref 11.5–15.5)
WBC: 10.5 10*3/uL (ref 4.0–10.5)

## 2014-02-06 SURGERY — INCISION AND DRAINAGE, ABSCESS
Anesthesia: General | Site: Groin | Laterality: Right

## 2014-02-06 MED ORDER — LIDOCAINE HCL (CARDIAC) 20 MG/ML IV SOLN
INTRAVENOUS | Status: AC
  Filled 2014-02-06: qty 10

## 2014-02-06 MED ORDER — POTASSIUM CHLORIDE IN NACL 20-0.9 MEQ/L-% IV SOLN
INTRAVENOUS | Status: DC
  Administered 2014-02-06: 50 mL/h via INTRAVENOUS
  Filled 2014-02-06 (×2): qty 1000

## 2014-02-06 MED ORDER — MORPHINE SULFATE 2 MG/ML IJ SOLN
2.0000 mg | INTRAMUSCULAR | Status: DC | PRN
  Administered 2014-02-06 – 2014-02-07 (×3): 2 mg via INTRAVENOUS
  Filled 2014-02-06 (×3): qty 1

## 2014-02-06 MED ORDER — PROPOFOL 10 MG/ML IV BOLUS
INTRAVENOUS | Status: DC | PRN
  Administered 2014-02-06: 200 mg via INTRAVENOUS

## 2014-02-06 MED ORDER — ROCURONIUM BROMIDE 100 MG/10ML IV SOLN
INTRAVENOUS | Status: AC
  Filled 2014-02-06: qty 1

## 2014-02-06 MED ORDER — ONDANSETRON HCL 4 MG/2ML IJ SOLN
INTRAMUSCULAR | Status: AC
  Filled 2014-02-06: qty 2

## 2014-02-06 MED ORDER — LACTATED RINGERS IV SOLN
INTRAVENOUS | Status: DC | PRN
  Administered 2014-02-06: 14:00:00 via INTRAVENOUS

## 2014-02-06 MED ORDER — PIPERACILLIN-TAZOBACTAM 3.375 G IVPB
3.3750 g | Freq: Three times a day (TID) | INTRAVENOUS | Status: DC
  Administered 2014-02-06 – 2014-02-07 (×3): 3.375 g via INTRAVENOUS
  Filled 2014-02-06 (×4): qty 50

## 2014-02-06 MED ORDER — FENTANYL CITRATE 0.05 MG/ML IJ SOLN
INTRAMUSCULAR | Status: DC | PRN
  Administered 2014-02-06: 100 ug via INTRAVENOUS

## 2014-02-06 MED ORDER — INSULIN GLARGINE 100 UNIT/ML ~~LOC~~ SOLN
15.0000 [IU] | Freq: Every day | SUBCUTANEOUS | Status: DC
  Administered 2014-02-06: 15 [IU] via SUBCUTANEOUS
  Filled 2014-02-06: qty 0.15

## 2014-02-06 MED ORDER — MIDAZOLAM HCL 5 MG/5ML IJ SOLN
INTRAMUSCULAR | Status: DC | PRN
  Administered 2014-02-06: 2 mg via INTRAVENOUS

## 2014-02-06 MED ORDER — GLYCOPYRROLATE 0.2 MG/ML IJ SOLN
INTRAMUSCULAR | Status: AC
  Filled 2014-02-06: qty 1

## 2014-02-06 MED ORDER — INSULIN ASPART 100 UNIT/ML ~~LOC~~ SOLN
0.0000 [IU] | Freq: Three times a day (TID) | SUBCUTANEOUS | Status: DC
Start: 2014-02-06 — End: 2014-02-07
  Administered 2014-02-06: 7 [IU] via SUBCUTANEOUS
  Administered 2014-02-07: 11 [IU] via SUBCUTANEOUS

## 2014-02-06 MED ORDER — SUCCINYLCHOLINE CHLORIDE 20 MG/ML IJ SOLN
INTRAMUSCULAR | Status: DC | PRN
  Administered 2014-02-06: 100 mg via INTRAVENOUS

## 2014-02-06 MED ORDER — ONDANSETRON HCL 4 MG/2ML IJ SOLN
INTRAMUSCULAR | Status: DC | PRN
  Administered 2014-02-06: 4 mg via INTRAVENOUS

## 2014-02-06 MED ORDER — PHENYLEPHRINE HCL 10 MG/ML IJ SOLN
INTRAMUSCULAR | Status: DC | PRN
  Administered 2014-02-06 (×4): 80 ug via INTRAVENOUS

## 2014-02-06 MED ORDER — PANTOPRAZOLE SODIUM 40 MG PO TBEC
40.0000 mg | DELAYED_RELEASE_TABLET | Freq: Every day | ORAL | Status: DC
  Administered 2014-02-07: 40 mg via ORAL
  Filled 2014-02-06: qty 1

## 2014-02-06 MED ORDER — PROPOFOL 10 MG/ML IV BOLUS
INTRAVENOUS | Status: AC
  Filled 2014-02-06: qty 20

## 2014-02-06 MED ORDER — NEOSTIGMINE METHYLSULFATE 10 MG/10ML IV SOLN
INTRAVENOUS | Status: AC
  Filled 2014-02-06: qty 1

## 2014-02-06 MED ORDER — ASPIRIN 81 MG PO CHEW
81.0000 mg | CHEWABLE_TABLET | Freq: Every day | ORAL | Status: DC
  Administered 2014-02-07: 81 mg via ORAL
  Filled 2014-02-06 (×2): qty 1

## 2014-02-06 MED ORDER — BUPIVACAINE-EPINEPHRINE (PF) 0.5% -1:200000 IJ SOLN
INTRAMUSCULAR | Status: AC
  Filled 2014-02-06: qty 30

## 2014-02-06 MED ORDER — LIDOCAINE HCL (CARDIAC) 20 MG/ML IV SOLN
INTRAVENOUS | Status: DC | PRN
  Administered 2014-02-06: 50 mg via INTRAVENOUS

## 2014-02-06 MED ORDER — FENTANYL CITRATE 0.05 MG/ML IJ SOLN
INTRAMUSCULAR | Status: AC
  Filled 2014-02-06: qty 5

## 2014-02-06 MED ORDER — POTASSIUM CHLORIDE IN NACL 20-0.9 MEQ/L-% IV SOLN
INTRAVENOUS | Status: AC
  Filled 2014-02-06: qty 1000

## 2014-02-06 MED ORDER — LISINOPRIL 40 MG PO TABS
40.0000 mg | ORAL_TABLET | Freq: Every day | ORAL | Status: DC
  Administered 2014-02-07: 40 mg via ORAL
  Filled 2014-02-06 (×2): qty 1

## 2014-02-06 MED ORDER — MEPERIDINE HCL 50 MG/ML IJ SOLN: 6.2500 mg | INTRAMUSCULAR | Status: DC | PRN

## 2014-02-06 MED ORDER — METOCLOPRAMIDE HCL 5 MG/ML IJ SOLN: 10.0000 mg | Freq: Once | INTRAMUSCULAR | Status: DC | PRN

## 2014-02-06 MED ORDER — BUPIVACAINE-EPINEPHRINE 0.5% -1:200000 IJ SOLN
INTRAMUSCULAR | Status: DC | PRN
  Administered 2014-02-06: 20 mL

## 2014-02-06 MED ORDER — 0.9 % SODIUM CHLORIDE (POUR BTL) OPTIME
TOPICAL | Status: DC | PRN
  Administered 2014-02-06: 1000 mL

## 2014-02-06 MED ORDER — PRAVASTATIN SODIUM 80 MG PO TABS
80.0000 mg | ORAL_TABLET | Freq: Every day | ORAL | Status: DC
  Administered 2014-02-07: 80 mg via ORAL
  Filled 2014-02-06 (×2): qty 1

## 2014-02-06 MED ORDER — MIDAZOLAM HCL 2 MG/2ML IJ SOLN
INTRAMUSCULAR | Status: AC
  Filled 2014-02-06: qty 2

## 2014-02-06 MED ORDER — ENOXAPARIN SODIUM 40 MG/0.4ML ~~LOC~~ SOLN
40.0000 mg | SUBCUTANEOUS | Status: DC
  Administered 2014-02-06: 40 mg via SUBCUTANEOUS
  Filled 2014-02-06 (×2): qty 0.4

## 2014-02-06 MED ORDER — HYDROMORPHONE HCL 1 MG/ML IJ SOLN: 0.2500 mg | INTRAMUSCULAR | Status: DC | PRN

## 2014-02-06 MED ORDER — EPHEDRINE SULFATE 50 MG/ML IJ SOLN
INTRAMUSCULAR | Status: AC
  Filled 2014-02-06: qty 1

## 2014-02-06 MED ORDER — GLYCOPYRROLATE 0.2 MG/ML IJ SOLN
INTRAMUSCULAR | Status: AC
  Filled 2014-02-06: qty 2

## 2014-02-06 MED ORDER — HYDROCODONE-ACETAMINOPHEN 5-325 MG PO TABS
1.0000 | ORAL_TABLET | ORAL | Status: DC | PRN
  Administered 2014-02-06: 1 via ORAL
  Administered 2014-02-07: 2 via ORAL
  Filled 2014-02-06 (×2): qty 2

## 2014-02-06 MED ORDER — METOPROLOL SUCCINATE ER 50 MG PO TB24
50.0000 mg | ORAL_TABLET | Freq: Every day | ORAL | Status: DC
  Administered 2014-02-07: 50 mg via ORAL
  Filled 2014-02-06 (×2): qty 1

## 2014-02-06 SURGICAL SUPPLY — 26 items
BNDG GAUZE ELAST 4 BULKY (GAUZE/BANDAGES/DRESSINGS) IMPLANT
DECANTER SPIKE VIAL GLASS SM (MISCELLANEOUS) IMPLANT
DRAPE LAPAROSCOPIC ABDOMINAL (DRAPES) IMPLANT
DRSG PAD ABDOMINAL 8X10 ST (GAUZE/BANDAGES/DRESSINGS) IMPLANT
ELECT REM PT RETURN 9FT ADLT (ELECTROSURGICAL) ×3
ELECTRODE REM PT RTRN 9FT ADLT (ELECTROSURGICAL) ×1 IMPLANT
GAUZE PACKING IODOFORM 2 (PACKING) ×2 IMPLANT
GAUZE SPONGE 4X4 12PLY STRL (GAUZE/BANDAGES/DRESSINGS) ×3 IMPLANT
GLOVE BIO SURGEON STRL SZ7.5 (GLOVE) ×3 IMPLANT
GLOVE ECLIPSE 7.5 STRL STRAW (GLOVE) ×3 IMPLANT
GOWN STRL REUS W/TWL XL LVL3 (GOWN DISPOSABLE) ×6 IMPLANT
KIT BASIN OR (CUSTOM PROCEDURE TRAY) ×3 IMPLANT
NDL HYPO 25X1 1.5 SAFETY (NEEDLE) IMPLANT
NEEDLE HYPO 25X1 1.5 SAFETY (NEEDLE) IMPLANT
NS IRRIG 1000ML POUR BTL (IV SOLUTION) ×3 IMPLANT
PACK GENERAL/GYN (CUSTOM PROCEDURE TRAY) ×3 IMPLANT
SPONGE LAP 18X18 X RAY DECT (DISPOSABLE) IMPLANT
SUT MNCRL AB 4-0 PS2 18 (SUTURE) IMPLANT
SUT VIC AB 3-0 SH 27 (SUTURE)
SUT VIC AB 3-0 SH 27XBRD (SUTURE) IMPLANT
SWAB COLLECTION DEVICE MRSA (MISCELLANEOUS) IMPLANT
SYR CONTROL 10ML LL (SYRINGE) IMPLANT
TAPE CLOTH SURG 4X10 WHT LF (GAUZE/BANDAGES/DRESSINGS) ×3 IMPLANT
TOWEL OR 17X26 10 PK STRL BLUE (TOWEL DISPOSABLE) ×3 IMPLANT
TOWEL OR NON WOVEN STRL DISP B (DISPOSABLE) IMPLANT
TUBE ANAEROBIC SPECIMEN COL (MISCELLANEOUS) IMPLANT

## 2014-02-06 NOTE — Op Note (Signed)
Preoperative Diagnosis: Abscess right groin abscess  Postoprative Diagnosis: Abscess right groin abscess  Procedure: Procedure(s): INCISION AND DRAINAGE ABSCESS right groin abcess debridement skin and subqutaneous tissue   Surgeon: Excell Seltzer T   Assistants: None  Anesthesia:  General endotracheal anesthesia  Indications: Patient is an insulin-dependent diabetic with a history of multiple soft tissue infections. He presents with 10 days of increasing redness and swelling in his right groin and has been on oral antibiotics for one week. Examination reveals an approximately 4 cm area of fluctuance with overlying skin necrosis in the right groin. I have recommended incision and drainage and debridement in the operating room. We discussed the risks of general anesthesia and bleeding and infection and wound healing problems that he is in agreement.    Procedure Detail:  Patient was brought to the operating room, placed in supine position on the operating table, and general endotracheal anesthesia induced. The right groin was widely sterilely prepped and draped. He received broad-spectrum IV antibiotics. Patient timeout was performed and correct procedure verified. I sharply elliptically debrided necrotic skin and subcutaneous tissue overlying the abscess cavity. A large amount of purulent material was drained and cultures were obtained. The abscess cavity was widely opened and exposed and irrigated. There was no deep necrotic tissue. The soft tissue was infiltrated with Marcaine. The wound was packed open with iodoform gauze and dry sterile dressing applied. Sponge needle and instrument counts were correct.     Tool used for debridement , Scalpel and cautery    Measurement of total devitalized tissue (wound surface) before and after surgical debridement.   Final size of the open wound 4x2 cm    Area and depth of devitalized tissue removed from wound.  4x2 cm area of necrotic skin and  subcutaneous tissue removed      Findings: As above   Estimated Blood Loss:  less than 50 mL         Drains: wound packed open with iodoform gauze  Blood Given: none          Specimens: Culture and sensitivity        Complications:  * No complications entered in OR log *         Disposition: PACU - hemodynamically stable.         Condition: stable

## 2014-02-06 NOTE — Anesthesia Procedure Notes (Signed)
Procedure Name: Intubation Date/Time: 02/06/2014 1:45 PM Performed by: Mandi Mattioli, Virgel Gess Pre-anesthesia Checklist: Patient identified, Emergency Drugs available, Suction available, Patient being monitored and Timeout performed Patient Re-evaluated:Patient Re-evaluated prior to inductionOxygen Delivery Method: Circle system utilized Preoxygenation: Pre-oxygenation with 100% oxygen Intubation Type: IV induction Ventilation: Mask ventilation without difficulty Laryngoscope Size: Mac and 4 Grade View: Grade III Tube type: Oral Tube size: 7.5 mm Number of attempts: 1 Airway Equipment and Method: Stylet Placement Confirmation: ETT inserted through vocal cords under direct vision,  positive ETCO2,  CO2 detector and breath sounds checked- equal and bilateral Secured at: 23 cm Tube secured with: Tape Dental Injury: Teeth and Oropharynx as per pre-operative assessment  Future Recommendations: Recommend- induction with short-acting agent, and alternative techniques readily available

## 2014-02-06 NOTE — Anesthesia Postprocedure Evaluation (Signed)
  Anesthesia Post-op Note  Patient: David Mcdowell  Procedure(s) Performed: Procedure(s): INCISION AND DRAINAGE ABSCESS right groin abcess debridement skin and subqutaneous tissue (Right)  Patient Location: PACU  Anesthesia Type:General  Level of Consciousness: awake, alert  and oriented  Airway and Oxygen Therapy: Patient Spontanous Breathing  Post-op Pain: none  Post-op Assessment: Post-op Vital signs reviewed, Patient's Cardiovascular Status Stable, Respiratory Function Stable, Patent Airway, No signs of Nausea or vomiting and Pain level controlled. BG 202 in PACU  Post-op Vital Signs: Reviewed and stable  Last Vitals:  Filed Vitals:   02/06/14 1530  BP: 124/73  Pulse: 99  Temp: 36.8 C  Resp: 20    Complications: No apparent anesthesia complications

## 2014-02-06 NOTE — Transfer of Care (Signed)
Immediate Anesthesia Transfer of Care Note  Patient: David Mcdowell  Procedure(s) Performed: Procedure(s): INCISION AND DRAINAGE ABSCESS right groin abcess debridement skin and subqutaneous tissue (Right)  Patient Location: PACU  Anesthesia Type:General  Level of Consciousness: awake, alert  and oriented  Airway & Oxygen Therapy: Patient Spontanous Breathing and Patient connected to face mask oxygen  Post-op Assessment: Report given to RN  Post vital signs: Reviewed and stable  Last Vitals:  Filed Vitals:   02/06/14 1231  BP: 143/79  Pulse: 103  Temp: 36.5 C  Resp: 17    Complications: No apparent anesthesia complications

## 2014-02-06 NOTE — ED Notes (Signed)
Patient has right groin abscess. Usually sees MD Jabier Mutton but is here to see MD on call for MD Roth. Hx DM-Type II and is on DM pump. MD advised for patient to come and have abscess taken care of-usually patient is consciously sedated for abscess drainage (was seen at Urgent Care and told the abscess was too big and there was too much infection to lance in the office). Has long hx of abscesses. Usually takes Augmentin. No other complaints/concerns. Denies N/V/D/F.

## 2014-02-06 NOTE — H&P (Signed)
David Mcdowell is an 59 y.o. male.    Chief Complaint: Infection right groin  HPI: Patient is a 59 year old diabetic male With a history of multiple soft tissue infections. He has had a number of drainage procedures for soft tissue infections and Has an Augmentin prescription at home that he takes When he feels he is getting an infection. For about 10 days he has had some discomfort and swelling and redness in his right groin. He has been taking Augmentin for 1 week without improvement and presented to the urgent care center at [facility] today. We were called and asked to manage and he is transferred here. He denies fever or chills. He has moderate discomfort at the site. No drainage.  Past Medical History  Diagnosis Date  . Type 2 diabetes mellitus   . Essential hypertension, benign   . Chest tightness     ETT-Myoview 4/12: EF 58%, no scar or ischemia  . Mixed hyperlipidemia   . Neuropathy   . Myositis   . Anxiety   . Abscess     Past Surgical History  Procedure Laterality Date  . Carpal tunnel release    . Back surgery      Family History  Problem Relation Age of Onset  . Coronary artery disease    . Heart disease Father   . Diabetes Father    Social History:  reports that he has never smoked. He has never used smokeless tobacco. He reports that he does not drink alcohol or use illicit drugs.  Allergies: No Known Allergies   Current Facility-Administered Medications  Medication Dose Route Frequency Provider Last Rate Last Dose  . piperacillin-tazobactam (ZOSYN) IVPB 3.375 g  3.375 g Intravenous 3 times per day Excell Seltzer, MD       Current Outpatient Prescriptions  Medication Sig Dispense Refill  . amoxicillin-clavulanate (AUGMENTIN) 875-125 MG per tablet Take 1 tablet by mouth 2 (two) times daily. Prn for flare     . aspirin 81 MG tablet Take 81 mg by mouth daily.      Marland Kitchen HYDROcodone-acetaminophen (VICODIN) 5-500 MG per tablet Take 1 tablet by mouth every  6 (six) hours as needed.      . insulin regular human CONCENTRATED (HUMULIN R) 500 UNIT/ML SOLN injection Inject into the skin.    Marland Kitchen lisinopril (PRINIVIL,ZESTRIL) 40 MG tablet TAKE ONE TABLET BY MOUTH EVERY DAY 90 tablet 3  . metFORMIN (GLUCOPHAGE) 1000 MG tablet Take 1,000 mg by mouth 2 (two) times daily with a meal.     . metoprolol succinate (TOPROL-XL) 50 MG 24 hr tablet Take 1 tablet (50 mg total) by mouth daily. 90 tablet 3  . omeprazole (PRILOSEC) 20 MG capsule TAKE ONE CAPSULE BY MOUTH ONCE DAILY 90 capsule 6  . pravastatin (PRAVACHOL) 80 MG tablet Take 1 tablet (80 mg total) by mouth daily. 90 tablet 3     No results found for this or any previous visit (from the past 48 hour(s)). No results found.  Review of Systems  Constitutional: Negative for fever, chills and malaise/fatigue.  Respiratory: Negative for shortness of breath and wheezing.   Cardiovascular: Negative for chest pain, palpitations and leg swelling.  Gastrointestinal: Negative for abdominal pain.  Musculoskeletal: Positive for joint pain.  Neurological: Positive for sensory change.    Blood pressure 143/79, pulse 103, temperature 97.7 F (36.5 C), temperature source Oral, resp. rate 17, height 6' (1.829 m), weight 272 lb (123.378 kg), SpO2 97 %. Physical Exam  General:  Alert, Moderately obese Caucasian male, in no distress Skin: Warm and dry. HEENT: No palpable masses or thyromegaly. Sclera nonicteric. Pupils equal round and reactive.  Lymph nodes: No cervical, supraclavicular, or inguinal nodes palpable. Lungs: Breath sounds clear and equal without increased work of breathing Cardiovascular: Regular rate and rhythm without murmur. No JVD or edema.  Abdomen: Nondistended. Soft and nontender. No masses palpable. No organomegaly. No palpable hernias. In the right groin crease is a 3-4 cm area of fluctuance with overlying skin necrosis and 2-3 cm of surrounding erythema. It is tender Extremities: No edema or joint  swelling or deformity. No chronic venous stasis changes. Neurologic: Alert and fully oriented. No gross motor deficits.  Assessment/Plan Right groin abscess. Insulin-dependent diabetes mellitus. This will require incision and drainage under anesthesia. I discussed this with the patient who is in agreement. We discussed risks of surgery including use of general anesthesia with possible heart or lung problems and slight risks of bleeding or wound healing problems. He understands and agrees.  Yoshito Gaza T 02/06/2014, 1:01 PM

## 2014-02-06 NOTE — ED Notes (Signed)
MD on call paged -wants to speak with patient.

## 2014-02-06 NOTE — Anesthesia Preprocedure Evaluation (Addendum)
Anesthesia Evaluation  Patient identified by MRN, date of birth, ID band Patient awake    Reviewed: Allergy & Precautions, NPO status , Patient's Chart, lab work & pertinent test results, reviewed documented beta blocker date and time   Airway Mallampati: I  TM Distance: >3 FB Neck ROM: Full    Dental no notable dental hx. (+) Teeth Intact   Pulmonary neg pulmonary ROS,  Snores- never had sleep study breath sounds clear to auscultation  Pulmonary exam normal       Cardiovascular hypertension, Pt. on medications and Pt. on home beta blockers Rhythm:Regular Rate:Normal  LVEF 55-60% 10 months ago Grade 1 diastolic dysfunction   Neuro/Psych Anxiety Diabetic neuropathy  Neuromuscular disease    GI/Hepatic Neg liver ROS, GERD-  Medicated and Controlled,  Endo/Other  diabetes, Poorly Controlled, Type 1, Insulin Dependent, Oral Hypoglycemic AgentsObesity Mixed hyperlipidemia Last HbA1c - 9.1  Renal/GU negative Renal ROS  negative genitourinary   Musculoskeletal negative musculoskeletal ROS (+)   Abdominal (+) + obese,   Peds  Hematology   Anesthesia Other Findings   Reproductive/Obstetrics negative OB ROS                        Anesthesia Physical Anesthesia Plan  ASA: III and emergent  Anesthesia Plan: General   Post-op Pain Management:    Induction: Intravenous, Rapid sequence and Cricoid pressure planned  Airway Management Planned: Oral ETT  Additional Equipment:   Intra-op Plan:   Post-operative Plan: Extubation in OR  Informed Consent: I have reviewed the patients History and Physical, chart, labs and discussed the procedure including the risks, benefits and alternatives for the proposed anesthesia with the patient or authorized representative who has indicated his/her understanding and acceptance.   Dental advisory given  Plan Discussed with: Anesthesiologist, CRNA and  Surgeon  Anesthesia Plan Comments:       Anesthesia Quick Evaluation

## 2014-02-06 NOTE — ED Notes (Signed)
Dr Excell Seltzer in speaking with pt regarding surgery, consent given to Dr Excell Seltzer, OR ready, CHG bath, wife has belongings.

## 2014-02-06 NOTE — ED Notes (Signed)
Seth Bake RN aware pt is coming to OR, they are waiting for pt.

## 2014-02-07 LAB — GLUCOSE, CAPILLARY: GLUCOSE-CAPILLARY: 261 mg/dL — AB (ref 70–99)

## 2014-02-07 MED ORDER — HYDROCODONE-ACETAMINOPHEN 5-500 MG PO TABS
1.0000 | ORAL_TABLET | ORAL | Status: DC | PRN
Start: 2014-02-07 — End: 2014-06-11

## 2014-02-07 MED ORDER — PNEUMOCOCCAL VAC POLYVALENT 25 MCG/0.5ML IJ INJ: 0.5000 mL | INJECTION | INTRAMUSCULAR | Status: DC

## 2014-02-07 MED ORDER — AMOXICILLIN-POT CLAVULANATE 875-125 MG PO TABS: 1.0000 | ORAL_TABLET | Freq: Two times a day (BID) | ORAL | Status: AC

## 2014-02-07 NOTE — Progress Notes (Signed)
UR completed 

## 2014-02-07 NOTE — Discharge Summary (Signed)
   Patient ID: David Mcdowell [id number] 59 y.o. Mar 22, 1955  02/06/2014  Discharge date and time: 02/07/2014   Admitting Physician: Excell Seltzer T  Discharge Physician: Excell Seltzer T  Admission Diagnoses: Abscess right groin abscess  Discharge Diagnoses: Same  Operations: Procedure(s): INCISION AND DRAINAGE ABSCESS right groin abcess debridement skin and subqutaneous tissue  Admission Condition: fair  Discharged Condition: good  Indication for Admission: Patient is a diabetic male with a history of frequent soft tissue infections. He presents with a 1-1/2 week history of increasing pain and swelling in the right groin and is found to have a several centimeter abscess on exam.  Hospital Course: Patient underwent incision and drainage of right groin abscess under general anesthesia. The following morning he is feeling much better. Wound is clean. Afebrile. White blood count normal. Plan to discharge on wound care and oral antibiotics.   Disposition: Home  Patient Instructions:    Medication List    TAKE these medications        amoxicillin-clavulanate 875-125 MG per tablet  Commonly known as:  AUGMENTIN  Take 1 tablet by mouth 2 (two) times daily at 10 AM and 5 PM. Prn for flare     aspirin 81 MG tablet  Take 81 mg by mouth daily.     HYDROcodone-acetaminophen 5-500 MG per tablet  Commonly known as:  VICODIN  Take 1-2 tablets by mouth every 4 (four) hours as needed for pain (pain).     insulin pump Soln  Inject into the skin daily. Insulin Concentrated (Humulin R-500) unit/ml     lisinopril 40 MG tablet  Commonly known as:  PRINIVIL,ZESTRIL  TAKE ONE TABLET BY MOUTH EVERY DAY     metFORMIN 1000 MG tablet  Commonly known as:  GLUCOPHAGE  Take 1,000 mg by mouth 2 (two) times daily with a meal.     metoprolol succinate 50 MG 24 hr tablet  Commonly known as:  TOPROL-XL  Take 1 tablet (50 mg total) by mouth daily.     omeprazole 20 MG capsule   Commonly known as:  PRILOSEC  TAKE ONE CAPSULE BY MOUTH ONCE DAILY     pravastatin 80 MG tablet  Commonly known as:  PRAVACHOL  Take 1 tablet (80 mg total) by mouth daily.     pregabalin 50 MG capsule  Commonly known as:  LYRICA  Take 50 mg by mouth 3 (three) times daily.        Activity: activity as tolerated Diet: diabetic diet Wound Care: daily moist saline wound packing  Follow-up:  With Dr. Excell Seltzer in 2 weeks.  Signed: Edward Jolly MD, FACS  02/07/2014, 9:12 AM

## 2014-02-07 NOTE — Discharge Instructions (Signed)
Loosely pack open right groin wound with moist saline gauze daily. May shower. Call [phone] as needed for problems or questions

## 2014-02-07 NOTE — Progress Notes (Signed)
Patient ID: David Mcdowell, male   DOB: 05/13/1955, 59 y.o.   MRN: [id number] 1 Day Post-Op  Subjective: Preop pain is relieved. Just sore from the surgical site.  Objective: Vital signs in last 24 hours: Temp:  [97.7 F (36.5 C)-98.6 F (37 C)] 98 F (36.7 C) (01/31 0550) Pulse Rate:  [86-103] 86 (01/31 0550) Resp:  [12-20] 18 (01/31 0550) BP: (107-143)/(64-82) 121/64 mmHg (01/31 0550) SpO2:  [93 %-100 %] 94 % (01/31 0550) Weight:  [272 lb (123.378 kg)] 272 lb (123.378 kg) (01/30 1231) Last BM Date: 02/06/14  Intake/Output from previous day: 01/30 0701 - 01/31 0700 In: 2434.2 [P.O.:960; I.V.:1324.2; IV Piggyback:150] Out: 2005 [Urine:2000; Blood:5] Intake/Output this shift:    General appearance: alert, cooperative and no distress Incision/Wound: packing removed. Wound is clean and without bleeding, erythema or unusual drainage  Lab Results:   Recent Labs  02/06/14 1329 02/06/14 1500  WBC  --  10.5  HGB 15.3 15.3  HCT 45.0 48.4  PLT  --  218   BMET  Recent Labs  02/06/14 1329 02/06/14 1500  NA 141 141  K 4.2 4.9  CL  --  110  CO2  --  20  GLUCOSE 276* 243*  BUN  --  13  CREATININE  --  1.02  CALCIUM  --  8.6     Studies/Results: No results found.  Anti-infectives: Anti-infectives    Start     Dose/Rate Route Frequency Ordered Stop   02/06/14 1400  piperacillin-tazobactam (ZOSYN) IVPB 3.375 g     3.375 g12.5 mL/hr over 240 Minutes Intravenous 3 times per day 02/06/14 1306        Assessment/Plan: s/p Procedure(s): INCISION AND DRAINAGE ABSCESS right groin abcess debridement skin and subqutaneous tissue Doing well. Okay for discharge.   LOS: 1 day    Keyvon Herter T 02/07/2014

## 2014-02-08 ENCOUNTER — Encounter (HOSPITAL_COMMUNITY): Payer: Self-pay | Admitting: General Surgery

## 2014-02-09 LAB — CULTURE, ROUTINE-ABSCESS

## 2014-02-13 LAB — ANAEROBIC CULTURE

## 2014-03-10 ENCOUNTER — Other Ambulatory Visit: Payer: Self-pay | Admitting: Cardiology

## 2014-06-11 ENCOUNTER — Ambulatory Visit (INDEPENDENT_AMBULATORY_CARE_PROVIDER_SITE_OTHER): Payer: Medicare Other | Admitting: Cardiology

## 2014-06-11 ENCOUNTER — Encounter: Payer: Self-pay | Admitting: Cardiology

## 2014-06-11 VITALS — BP 122/78 | HR 96 | Ht 72.0 in | Wt 278.0 lb

## 2014-06-11 DIAGNOSIS — E1165 Type 2 diabetes mellitus with hyperglycemia: Secondary | ICD-10-CM | POA: Diagnosis not present

## 2014-06-11 DIAGNOSIS — I10 Essential (primary) hypertension: Secondary | ICD-10-CM

## 2014-06-11 DIAGNOSIS — E1140 Type 2 diabetes mellitus with diabetic neuropathy, unspecified: Secondary | ICD-10-CM | POA: Diagnosis not present

## 2014-06-11 DIAGNOSIS — IMO0002 Reserved for concepts with insufficient information to code with codable children: Secondary | ICD-10-CM

## 2014-06-11 NOTE — Progress Notes (Signed)
Cardiology Office Note  Date: 06/11/2014   ID: David Mcdowell, DOB Feb 18, 1955, MRN [id number]  PCP: Purvis Kilts, MD  Primary Cardiologist: Rozann Lesches, MD   Chief Complaint  Patient presents with  . Hypertension    History of Present Illness: David Mcdowell is a 59 y.o. male last seen in December 2015. He presents for a routine follow-up visit. From a cardiac perspective, he denies any significant angina symptoms. He is functionally limited, citing progressive problems with arthritic left knee pain. He states that he is not able to exercise due to this.  He continues to follow with Dr. Chalmers Cater for diabetes and cholesterol management.  Record review finds hospitalization in January of this year for incision and drainage of a right groin abscess with Dr. Excell Seltzer.  He reports good blood pressure control overall. Follow-up ECG shows normal sinus rhythm.   Past Medical History  Diagnosis Date  . Type 2 diabetes mellitus   . Essential hypertension, benign   . Chest tightness     ETT-Myoview 4/12: EF 58%, no scar or ischemia  . Mixed hyperlipidemia   . Neuropathy   . Myositis   . Anxiety   . Abscess      Current Outpatient Prescriptions  Medication Sig Dispense Refill  . aspirin 81 MG tablet Take 81 mg by mouth daily.      Marland Kitchen HYDROcodone-acetaminophen (NORCO/VICODIN) 5-325 MG per tablet Take 1 tablet by mouth every 6 (six) hours as needed for moderate pain.    . Insulin Human (INSULIN PUMP) SOLN Inject into the skin daily. Insulin Concentrated (Humulin R-500) unit/ml    . lisinopril (PRINIVIL,ZESTRIL) 40 MG tablet TAKE ONE TABLET BY MOUTH EVERY DAY 90 tablet 3  . metFORMIN (GLUCOPHAGE) 1000 MG tablet Take 1,000 mg by mouth 2 (two) times daily with a meal.     . metoprolol succinate (TOPROL-XL) 50 MG 24 hr tablet Take 1 tablet (50 mg total) by mouth daily. 90 tablet 3  . omeprazole (PRILOSEC) 20 MG capsule TAKE ONE CAPSULE BY MOUTH ONCE DAILY 90 capsule 0  .  pravastatin (PRAVACHOL) 80 MG tablet Take 1 tablet (80 mg total) by mouth daily. 90 tablet 3  . pregabalin (LYRICA) 100 MG capsule Take 100 mg by mouth 3 (three) times daily. 2-3 times daily as needed     No current facility-administered medications for this visit.    Allergies:  Review of patient's allergies indicates no known allergies.   Social History: The patient  reports that he has never smoked. He has never used smokeless tobacco. He reports that he does not drink alcohol or use illicit drugs.    ROS:  Please see the history of present illness. Otherwise, complete review of systems is positive for left knee pain.  All other systems are reviewed and negative.   Physical Exam: VS:  BP 122/78 mmHg  Pulse 96  Ht 6' (1.829 m)  Wt 278 lb (126.1 kg)  BMI 37.70 kg/m2  SpO2 94%, BMI Body mass index is 37.7 kg/(m^2).  Wt Readings from Last 3 Encounters:  06/11/14 278 lb (126.1 kg)  02/06/14 272 lb (123.378 kg)  12/18/13 272 lb (123.378 kg)     Obese male, comfortable at rest.  HEENT: Conjunctiva and lids normal, oropharynx clear.  Neck: Supple, no elevated JVP or carotid bruits, no thyromegaly.  Lungs: Clear to auscultation, nonlabored breathing at rest.  Cardiac: Regular rate and rhythm, no S3 or significant systolic murmur, no pericardial rub.  Abdomen: Soft, nontender, bowel sounds present.  Extremities: No pitting edema, distal pulses 2+.    ECG: ECG today shows normal sinus rhythm.    Other Studies Reviewed Today:  Echocardiogram from March 2015 showed mild to moderate LVH with LVEF 86-57%, grade 1 diastolic dysfunction, mildly sclerotic aortic valve.  Assessment and Plan:  1. Essential hypertension, blood pressure is normal today. No changes were made to current regimen.  2. Type 2 diabetes mellitus and hyperlipidemia, followed by Dr. Chalmers Cater.  3. Progressive left knee arthritic pain with limited ambulation. He follows with Dr. Percell Miller. He states that he may  end up needing knee replacement over the next year. Last ischemic evaluation was in 2012. He has no clearly documented evidence of obstructive CAD, although prior chest discomfort. Prior to pursuing left knee surgery, we will most likely follow up with a repeat Cardiolite.  Current medicines were reviewed with the patient today.  Disposition: FU with me in 6 months.   Signed, Satira Sark, MD, [facility] 06/11/2014 12:05 PM    [facility], [place], [place] [address] Phone: [phone]; Fax: [phone]

## 2014-06-11 NOTE — Patient Instructions (Signed)
Your physician recommends that you continue on your current medications as directed. Please refer to the Current Medication list given to you today. Your physician recommends that you schedule a follow-up appointment in: 6 months. You will receive a reminder letter in the mail in about 4 months reminding you to call and schedule your appointment. If you don't receive this letter, please contact our office. 

## 2014-07-05 ENCOUNTER — Other Ambulatory Visit: Payer: Self-pay

## 2014-07-30 ENCOUNTER — Telehealth: Payer: Self-pay | Admitting: *Deleted

## 2014-07-30 DIAGNOSIS — Z01818 Encounter for other preprocedural examination: Secondary | ICD-10-CM

## 2014-07-30 NOTE — Telephone Encounter (Signed)
Fax received from [facility] requesting cardiac clearance for left total knee replacement for patient.  Scheduled 09/22/14.    Per Dr. Domenic Polite - patient will need Lexiscan Cardiolite for pre-op cardiac evaluation and then he will make recommendation.    Patient notified & agrees to doing test.  Order will be put in & forwarded to PCP Olegario Shearer) for scheduling.  Instructions given to have nothing to eat/drink for 4 hours prior to test & no caffeine for 24 hours.  He may take all medications excluding his Diabetes medication.  Patient verbalized understanding.

## 2014-08-06 ENCOUNTER — Inpatient Hospital Stay (HOSPITAL_COMMUNITY): Admission: RE | Admit: 2014-08-06 | Payer: Medicare Other | Source: Ambulatory Visit

## 2014-08-06 ENCOUNTER — Encounter (HOSPITAL_COMMUNITY)
Admission: RE | Admit: 2014-08-06 | Discharge: 2014-08-06 | Disposition: A | Discharge status: Home / Self Care | Payer: Medicare Other | Source: Ambulatory Visit | Attending: Cardiology | Admitting: Cardiology

## 2014-08-06 ENCOUNTER — Encounter (HOSPITAL_COMMUNITY): Payer: Self-pay

## 2014-08-06 DIAGNOSIS — Z01818 Encounter for other preprocedural examination: Secondary | ICD-10-CM

## 2014-08-06 LAB — NM MYOCAR MULTI W/SPECT W/WALL MOTION / EF
CSEPPHR: 104 {beats}/min
LHR: 0.34
LV dias vol: 93 mL
LV sys vol: 33 mL
NUC STRESS TID: 1.11
Rest HR: 81 {beats}/min
SDS: 2
SRS: 4
SSS: 4

## 2014-08-06 MED ORDER — SODIUM CHLORIDE 0.9 % IJ SOLN
INTRAMUSCULAR | Status: AC
  Administered 2014-08-06: 10 mL via INTRAVENOUS
  Filled 2014-08-06: qty 3

## 2014-08-06 MED ORDER — REGADENOSON 0.4 MG/5ML IV SOLN
INTRAVENOUS | Status: AC
  Administered 2014-08-06: 0.4 mg via INTRAVENOUS
  Filled 2014-08-06: qty 5

## 2014-08-06 MED ORDER — TECHNETIUM TC 99M SESTAMIBI - CARDIOLITE
30.0000 | Freq: Once | INTRAVENOUS | Status: AC | PRN
  Administered 2014-08-06: 30 via INTRAVENOUS

## 2014-08-06 MED ORDER — TECHNETIUM TC 99M SESTAMIBI GENERIC - CARDIOLITE
10.0000 | Freq: Once | INTRAVENOUS | Status: AC | PRN
  Administered 2014-08-06: 10 via INTRAVENOUS

## 2014-08-09 ENCOUNTER — Telehealth: Payer: Self-pay | Admitting: *Deleted

## 2014-08-09 NOTE — Telephone Encounter (Signed)
-----   Message from Satira Sark, MD sent at 08/06/2014  4:20 PM EDT ----- Reviewed report. Stress test low risk showing no evidence of ischemia with LVEF 65%. Please obtain surgical clearance form if not already done, and I will complete when in the [place] office next week.

## 2014-08-09 NOTE — Telephone Encounter (Signed)
Notes Recorded by Laurine Blazer, LPN on 09/14/4716 at 55:01 AM Patient notified. Clearance form & office note faxed to Ralie[facility] Ip by L. Ouida Sills, LPN.

## 2014-08-17 ENCOUNTER — Other Ambulatory Visit: Payer: Self-pay | Admitting: Adult Health

## 2014-09-01 ENCOUNTER — Encounter: Payer: Medicare Other | Attending: Endocrinology | Admitting: Nutrition

## 2014-09-01 ENCOUNTER — Encounter: Payer: Self-pay | Admitting: Nutrition

## 2014-09-01 VITALS — Ht 72.0 in | Wt 279.0 lb

## 2014-09-01 DIAGNOSIS — E118 Type 2 diabetes mellitus with unspecified complications: Secondary | ICD-10-CM | POA: Diagnosis present

## 2014-09-01 DIAGNOSIS — E1165 Type 2 diabetes mellitus with hyperglycemia: Secondary | ICD-10-CM

## 2014-09-01 DIAGNOSIS — Z6838 Body mass index (BMI) 38.0-38.9, adult: Secondary | ICD-10-CM | POA: Diagnosis not present

## 2014-09-01 DIAGNOSIS — Z713 Dietary counseling and surveillance: Secondary | ICD-10-CM | POA: Insufficient documentation

## 2014-09-01 DIAGNOSIS — IMO0002 Reserved for concepts with insufficient information to code with codable children: Secondary | ICD-10-CM

## 2014-09-01 DIAGNOSIS — E669 Obesity, unspecified: Secondary | ICD-10-CM | POA: Insufficient documentation

## 2014-09-01 NOTE — Progress Notes (Signed)
Medical Nutrition Therapy:  Appt start time: 0800 end time:  0930.  Assessment:  Primary concerns today: Diabetes Type 2.  Lives with his wife. He does the cooking. Retired 6 years from [employer].Doesn't rourtinely test his blood sugars but occassionally during the day.. Bolus sometimes??. Not compliant with insulin pump usage of  testing and bolusing for desired blood sugar goals. He notes he doesn't have time to test as he is suppose to or eat on time. Tests sometimes during the day.  Most foods are baked, broiled and grilled. LIttle frying. His wife and he both share in the shopping. Most recent A1C 10.6%. Metformin 1000 mg BID. Going to have left  knee surgery Sept 14th.   He is on U500 with his pump. He notes he has to bolus an hour before his meal due to slower acting insulin..Doesn't know what his insuiln carb ration is nor how to do a correction factor plus his bolus amount for high blood sugars. U500 insulin with [employer] pump. Currently has a loaner pump due to some malfunctions of his other pump. Has not uploaded his meter except when he first got it. Sees Dr. Chalmers Cater and she doesn't upload his pump.  Had the pump for 8-9 years. Went to a DM class but never got 1:1 instruction. Can't exercise much right now due to his left knee problems.Marland Kitchen Has had to take more pain meds recently due to chronic pain. Diabetes not controlled.  Diet is excessive in calories, Carbs, fat and sodium. No consistent meal pattern. Not compliant with using his Pump adequately to control his blood sugars. Would recommend to consider basal bolus DMI insulin regimen for now and suspend his  his pump until we get his blood sugars under better control. He denies the need for pump training again but I feel he would benefit. Contacted [employer] pump to inquire about his loaner pump and to see if they can work with him on uploading his pump to get readings etc. Talked with Vinnie Level, Geophysical data processor.  Preferred Learning  Style:  No preference indicated   Learning Readiness:  Ready  Change in progress   MEDICATIONS:See list   DIETARY INTAKE:   24-hr recall:  B ( AM): 1 cup coffee,  Frozen sausage biscuit, OR bagel large bagel, butter, Diet CF Diet Mt Dew OR Diet Crancherry Snk ( AM): none   L ( PM): Can of beanie weiners and PB sandwich and Diet Mt Dew or Decaf Unsweet tea plain with lemon Snk ( PM): Not usual; OR  Mini candie is blood sugar low. OR piece of cheese D ( PM):Chinese Chicken and brocoli and  Lo mein , unsweet tea and lemon Snk ( PM): Peanuts 1/4 c peanuts, Diet Mt Dew Beverages: water, diet mt dew, coffee and unsweet tea. G2 gaterade  Usual physical activity: LImited due to knee problems.  Estimated energy needs: 1800 calories 200 g carbohydrates 135 g protein 50 g fat  Progress Towards Goal(s):  In progress.   Nutritional Diagnosis:  NB-1.1 Food and nutrition-related knowledge deficit As related to Diabetes.  As evidenced by A1C 11.6%..    Intervention:  Nutrition counseling and diabetes education provided on disease process and function of insulin and glucose, CHO counting, meal planning, portion sizes, target ranges for blood sugars, complications of DM from poor wound healing, CKD, retinopathy, neuropathy and risks for CVD of heart attack or stroke. Discussed importance of proper use of insulin pump, testing of blood sugars, correcting for high blood  sugars and dosing bolus amounts before meals. Stressed need for meal time consistency, testing 4-5 times per day and correcting for high blood sugars. Reviewed testing techniques. Goals:  1. Follow My Plate Method 2. Eat 45-60 grams of carbs per meal. 3. Avoid skipping meals. 4. Eat three balanced meals on time as discussed: B) 6-8 am L) 12-2 pm D) 5-7 pm 5Avoid snacks unless having a low blood sugar less than 70 mg/dl. 6. Cut out juices, tea, sodas, diet sodas and other sugar and sugar free beverages.  Drink ONLY WATER.  6+  bottles of water per day. 7. Will get in touch with [employer] to assist with pump training, settings and uploads. 8. Get A1C down to 8% in 1-2 months. 9. Test before each meal and use correction factor and bolus insulin for carbs for all meals. 10. May bolus 30 minutes before a meal due to slower acting insulin but do not bolus an hour before the meal.  Teaching Method Utilized:  Visual Auditory Hands on  Handouts given during visit include:  The Plate Method  Meal Plan Card  Diabetes Instructions  Barriers to learning/adherence to lifestyle change: None  Demonstrated degree of understanding via:  Teach Back   Monitoring/Evaluation:  Dietary intake, exercise, meal planning, SBG, and body weight in 1-2 month(s) after his knee surgery. Consider MDI of basal bolus insulin until he become more compliant with testing and dosing insulin for improved blood sugars. Will ask [employer] Rep, Suzzane to contact patient about loan pump and getting it uploaded to see where adjustments need to be made along with needed pump training on proper corrections and bolus dosing of insulin for meals.

## 2014-09-01 NOTE — Patient Instructions (Signed)
Goals:  1. Follow My Plate Method 2. Eat 45-60 grams of carbs per meal. 3. Avoid skipping meals. 4. Eat three balanced meals on time as discussed: B) 6-8 am L) 12-2 pm D) 5-7 pm 5Avoid snacks unless having a low blood sugar less than 70 mg/dl. 6. Cut out juices, tea, sodas, diet sodas and other sugar and sugar free beverages.  Drink ONLY WATER.  6+ bottles of water per day. 7. Will get in touch with [employer] to assist with pump training, settings and uploads. 8. Get A1C down to 8% in 1-2 months. 9. Test before each meal and use correction factor and bolus insulin for carbs for all meals. 10. May bolus 30 minutes before a meal due to slower acting insulin but do not bolus an hour before the meal.

## 2014-09-07 ENCOUNTER — Other Ambulatory Visit: Payer: Self-pay | Admitting: Physician Assistant

## 2014-09-07 NOTE — H&P (Signed)
TOTAL KNEE ADMISSION H&P  Patient is being admitted for left total knee arthroplasty.  Subjective:  Chief Complaint:left knee pain.  HPI: David Mcdowell, 59 y.o. male, has a history of pain and functional disability in the left knee due to arthritis and has failed non-surgical conservative treatments for greater than 12 weeks to includeNSAID's and/or analgesics, corticosteriod injections and activity modification.  Onset of symptoms was gradual, starting >10 years ago with gradually worsening course since that time. The patient noted prior procedures on the knee to include  arthroscopy and menisectomy on the left knee(s).  Patient currently rates pain in the left knee(s) at 6 out of 10 with activity. Patient has night pain, worsening of pain with activity and weight bearing and crepitus.  Patient has evidence of subchondral sclerosis and joint space narrowing by imaging studies. There is no active infection.  Patient Active Problem List   Diagnosis Date Noted  . Groin abscess 02/06/2014  . Abscess of groin, right 02/06/2014  . 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 Cyst of buttocks 11/25/2012  . Mixed hyperlipidemia 07/08/2012  . Dyspepsia 07/08/2012  . Precordial pain 05/04/2010  . Type 2 diabetes mellitus, uncontrolled, with neuropathy 05/04/2010  . Essential hypertension, benign 05/04/2010   Past Medical History  Diagnosis Date  . Type 2 diabetes mellitus   . Essential hypertension, benign   . Chest tightness     ETT-Myoview 4/12: EF 58%, no scar or ischemia  . Mixed hyperlipidemia   . Neuropathy   . Myositis   . Anxiety   . Abscess     Past Surgical History  Procedure Laterality Date  . Carpal tunnel release    . Back surgery    . Incision and drainage abscess Right 02/06/2014    Procedure: INCISION AND DRAINAGE ABSCESS right groin abcess debridement skin and subqutaneous tissue;  Surgeon: Excell Seltzer, MD;  Location: [facility];  Service: General;  Laterality: Right;     (Not in a  hospital admission) No Known Allergies  Social History  Substance Use Topics  . Smoking status: Never Smoker   . Smokeless tobacco: Never Used  . Alcohol Use: No    Family History  Problem Relation Age of Onset  . Coronary artery disease    . Heart disease Father   . Diabetes Father      Review of Systems  Constitutional: Negative.   HENT: Negative.   Eyes: Negative.   Respiratory: Negative.   Cardiovascular: Negative.   Gastrointestinal: Negative.   Genitourinary: Negative.   Musculoskeletal: Positive for joint pain.  Skin: Negative.   Neurological: Negative.   Endo/Heme/Allergies: Negative.   Psychiatric/Behavioral: Negative.     Objective:  Physical Exam  Constitutional: He is oriented to person, place, and time. He appears well-developed and well-nourished.  HENT:  Head: Normocephalic and atraumatic.  Eyes: EOM are normal. Pupils are equal, round, and reactive to light.  Neck: Normal range of motion. Neck supple.  Cardiovascular: Normal rate and regular rhythm.   Respiratory: Effort normal. No respiratory distress. He has wheezes. He has no rales.  GI: Soft. Bowel sounds are normal.  Musculoskeletal:  Specifically, antalgic gait.  Varus thrust, left greater than right.  Negative log roll of both hips.  Neurovascularly intact distally.  The left knee motion is 5-100.    Neurological: He is alert and oriented to person, place, and time.  Skin: Skin is warm and dry.  Psychiatric: He has a normal mood and affect. His behavior is normal. Judgment  and thought content normal.    Vital signs in last 24 hours: @VSRANGES @  Labs:   Estimated body mass index is 37.83 kg/(m^2) as calculated from the following:   Height as of 09/01/14: 6' (1.829 m).   Weight as of 09/01/14: 126.554 kg (279 lb).   Imaging Review Plain radiographs demonstrate severe degenerative joint disease of the left knee(s). The overall alignment ismild varus. The bone quality appears to be fair for  age and reported activity level.  Assessment/Plan:  End stage arthritis, left knee   The patient history, physical examination, clinical judgment of the provider and imaging studies are consistent with end stage degenerative joint disease of the left knee(s) and total knee arthroplasty is deemed medically necessary. The treatment options including medical management, injection therapy arthroscopy and arthroplasty were discussed at length. The risks and benefits of total knee arthroplasty were presented and reviewed. The risks due to aseptic loosening, infection, stiffness, patella tracking problems, thromboembolic complications and other imponderables were discussed. The patient acknowledged the explanation, agreed to proceed with the plan and consent was signed. Patient is being admitted for inpatient treatment for surgery, pain control, PT, OT, prophylactic antibiotics, VTE prophylaxis, progressive ambulation and ADL's and discharge planning. The patient is planning to be discharged home with home health services

## 2014-09-09 ENCOUNTER — Encounter (HOSPITAL_COMMUNITY): Payer: Self-pay

## 2014-09-09 ENCOUNTER — Encounter (HOSPITAL_COMMUNITY)
Admission: RE | Admit: 2014-09-09 | Discharge: 2014-09-09 | Disposition: A | Discharge status: Home / Self Care | Payer: Medicare Other | Source: Ambulatory Visit | Attending: Orthopedic Surgery | Admitting: Orthopedic Surgery

## 2014-09-09 DIAGNOSIS — Z0183 Encounter for blood typing: Secondary | ICD-10-CM | POA: Insufficient documentation

## 2014-09-09 DIAGNOSIS — Z01818 Encounter for other preprocedural examination: Secondary | ICD-10-CM | POA: Diagnosis not present

## 2014-09-09 DIAGNOSIS — K219 Gastro-esophageal reflux disease without esophagitis: Secondary | ICD-10-CM | POA: Diagnosis not present

## 2014-09-09 DIAGNOSIS — Z79899 Other long term (current) drug therapy: Secondary | ICD-10-CM | POA: Diagnosis not present

## 2014-09-09 DIAGNOSIS — I10 Essential (primary) hypertension: Secondary | ICD-10-CM | POA: Insufficient documentation

## 2014-09-09 DIAGNOSIS — Z7982 Long term (current) use of aspirin: Secondary | ICD-10-CM | POA: Diagnosis not present

## 2014-09-09 DIAGNOSIS — E785 Hyperlipidemia, unspecified: Secondary | ICD-10-CM | POA: Diagnosis not present

## 2014-09-09 DIAGNOSIS — E119 Type 2 diabetes mellitus without complications: Secondary | ICD-10-CM | POA: Insufficient documentation

## 2014-09-09 DIAGNOSIS — M179 Osteoarthritis of knee, unspecified: Secondary | ICD-10-CM | POA: Insufficient documentation

## 2014-09-09 DIAGNOSIS — Z9641 Presence of insulin pump (external) (internal): Secondary | ICD-10-CM | POA: Diagnosis not present

## 2014-09-09 DIAGNOSIS — Z01812 Encounter for preprocedural laboratory examination: Secondary | ICD-10-CM | POA: Insufficient documentation

## 2014-09-09 HISTORY — DX: Unspecified osteoarthritis, unspecified site: M19.90

## 2014-09-09 HISTORY — DX: Gastro-esophageal reflux disease without esophagitis: K21.9

## 2014-09-09 HISTORY — DX: Calculus of kidney: N20.0

## 2014-09-09 LAB — COMPREHENSIVE METABOLIC PANEL
ALT: 79 U/L — AB (ref 17–63)
ANION GAP: 9 (ref 5–15)
AST: 93 U/L — ABNORMAL HIGH (ref 15–41)
Albumin: 3.8 g/dL (ref 3.5–5.0)
Alkaline Phosphatase: 66 U/L (ref 38–126)
BUN: 16 mg/dL (ref 6–20)
CHLORIDE: 103 mmol/L (ref 101–111)
CO2: 23 mmol/L (ref 22–32)
CREATININE: 1.14 mg/dL (ref 0.61–1.24)
Calcium: 9 mg/dL (ref 8.9–10.3)
GFR calc non Af Amer: >60 mL/min (ref >60)
Glucose, Bld: 252 mg/dL — ABNORMAL HIGH (ref 65–99)
POTASSIUM: 4.1 mmol/L (ref 3.5–5.1)
SODIUM: 135 mmol/L (ref 135–145)
Total Bilirubin: 0.8 mg/dL (ref 0.3–1.2)
Total Protein: 6.7 g/dL (ref 6.5–8.1)

## 2014-09-09 LAB — CBC WITH DIFFERENTIAL/PLATELET
BASOS ABS: 0.1 10*3/uL (ref 0.0–0.1)
Basophils Relative: 1 % (ref 0–1)
EOS ABS: 0.3 10*3/uL (ref 0.0–0.7)
EOS PCT: 3 % (ref 0–5)
HCT: 43.8 % (ref 39.0–52.0)
Hemoglobin: 13.9 g/dL (ref 13.0–17.0)
LYMPHS ABS: 3.3 10*3/uL (ref 0.7–4.0)
Lymphocytes Relative: 34 % (ref 12–46)
MCH: 27.4 pg (ref 26.0–34.0)
MCHC: 31.7 g/dL (ref 30.0–36.0)
MCV: 86.2 fL (ref 78.0–100.0)
Monocytes Absolute: 0.9 10*3/uL (ref 0.1–1.0)
Monocytes Relative: 9 % (ref 3–12)
Neutro Abs: 5.3 10*3/uL (ref 1.7–7.7)
Neutrophils Relative %: 53 % (ref 43–77)
PLATELETS: 286 10*3/uL (ref 150–400)
RBC: 5.08 MIL/uL (ref 4.22–5.81)
RDW: 14 % (ref 11.5–15.5)
WBC: 9.9 10*3/uL (ref 4.0–10.5)

## 2014-09-09 LAB — PROTIME-INR
INR: 1.1 (ref 0.00–1.49)
Prothrombin Time: 14.4 seconds (ref 11.6–15.2)

## 2014-09-09 LAB — APTT: APTT: 31 s (ref 24–37)

## 2014-09-09 LAB — ABO/RH: ABO/RH(D): AB POS

## 2014-09-09 LAB — TYPE AND SCREEN
ABO/RH(D): AB POS
ANTIBODY SCREEN: NEGATIVE

## 2014-09-09 LAB — SURGICAL PCR SCREEN
MRSA, PCR: NEGATIVE
Staphylococcus aureus: NEGATIVE

## 2014-09-09 LAB — GLUCOSE, CAPILLARY: GLUCOSE-CAPILLARY: 254 mg/dL — AB (ref 65–99)

## 2014-09-09 NOTE — Progress Notes (Addendum)
Lov with Dr. Domenic Polite was back in June -  [facility].  PCP is Dr. Hilma Favors. Well controlled on insulin pump.   Denies any heart problems.  Patient states his last HGB A1C was over 10 about 2 mths ago. (back in January, it was 9.9)   He has been consulting with nutritionist, re-adjusting insulin.    Morning sugars run from 88-240.   Patient states that his basal rate runs relatively low.  Its his boluses that have to be regulated to a tee.  He will adjust himself accordingly.

## 2014-09-09 NOTE — Progress Notes (Signed)
   09/09/14 1033  OBSTRUCTIVE SLEEP APNEA  Have you ever been diagnosed with sleep apnea through a sleep study? No  Do you snore loudly (loud enough to be heard through closed doors)?  1  Do you often feel tired, fatigued, or sleepy during the daytime? 0  Has anyone observed you stop breathing during your sleep? 0  Do you have, or are you being treated for high blood pressure? 1  BMI more than 35 kg/m2? 1  Age over 57 years old? 1  Neck circumference greater than 40 cm/16 inches? 1  Gender: 1

## 2014-09-09 NOTE — Pre-Procedure Instructions (Signed)
David Mcdowell  09/09/2014      [facility] - [place], [place] [address] Phone: [phone] Fax: [phone]  [facility] [address], [place] [address] Phone: [phone] Fax: [phone]    Your procedure is scheduled on Wednesday, September 22, 2014  Report to [facility] Admitting at 6:30 A.M.  Call this number if you have problems the morning of surgery:  [phone]   Remember:  Do not eat food or drink liquids after midnight Tuesday, September 21, 2014  Take these medicines the morning of surgery with A SIP OF WATER :metoprolol succinate (TOPROL-XL), omeprazole (PRILOSEC), if needed:pregabalin (LYRICA), HYDROcodone-acetaminophen (NORCO/VICODIN) for pain  Stop taking Aspirin, vitamins and herbal medications. Do not take any NSAIDs ie: Ibuprofen, Advil, Naproxen or any medication containing Aspirin; stop 1 week prior to procedure ( Wednesday, September 15, 2014). How to Manage Your Diabetes Before Surgery  Why is it important to control my blood sugar before and after surgery?   Improving blood sugar levels before and after surgery helps healing and can limit problems.  A way of improving blood sugar control is eating a healthy diet by:  - Eating less sugar and carbohydrates  - Increasing activity/exercise  - Talk with your doctor about reaching your blood sugar goals  High blood sugars (greater than 180 mg/dL) can raise your risk of infections and slow down your recovery so you will need to focus on controlling your diabetes during the weeks before surgery.  Make sure that the doctor who takes care of your diabetes knows about your planned surgery including the date and location.  How do I manage my blood sugars before surgery?   Check your blood sugar at least 4 times a day, 2 days before surgery to make sure that they are not too high or  low.   Check your blood sugar the morning of your surgery when you wake up and every 2 hours until you get to the Short-Stay unit.  If your blood sugar is less than 70 mg/dL, you will need to treat for low blood sugar by:  Treat a low blood sugar (less than 70 mg/dL) with 1/2 cup of clear juice (cranberry or apple), 4 glucose tablets, OR glucose gel.  Recheck blood sugar in 15 minutes after treatment (to make sure it is greater than 70 mg/dL).  If blood sugar is not greater than 70 mg/dL on re-check, call [phone] for further instructions.   Report your blood sugar to the Short-Stay nurse when you get to Short-Stay.  References:  [facility], 2007 "How to Manage your Diabetes Before and After Surgery".  What do I do about my diabetes medications?   Do not take oral diabetes medicines (pills) the morning of surgery such as metFORMIN (GLUCOPHAGE)  THE NIGHT BEFORE SURGERY, take    units of     Insulin.  THE MORNING take      units of     Insulin.  Do not take other diabetes injectables the day of surgery including Byetta, Victoza, Bydureon, and Trulicity.  If your CBG is greater than 220 mg/dL, you may take 1/2 of your sliding scale (correction) dose of insulin.   For patients with "Insulin Pumps":  Contact your diabetes doctor for specific instructions before surgery.   Decrease basal insulin rates by 20%  at midnight the night before surgery.  Note that if your surgery is planned to be longer than 2 hours, your insulin pump will be removed and intravenous (IV) insulin will be started and managed by the nurses and anesthesiologist.  You will be able to restart your insulin pump once you are awake and able to manage it.  Make sure to bring insulin pump supplies to the hospital with you in case your site needs to be changed.    Do not wear jewelry, make-up or nail polish.  Do not wear lotions, powders, or perfumes.  You may not wear deodorant.  Do  not shave 48 hours prior to surgery.  Men may shave face and neck.  Do not bring valuables to the hospital.  [facility] is not responsible for any belongings or valuables.  Contacts, dentures or bridgework may not be worn into surgery.  Leave your suitcase in the car.  After surgery it may be brought to your room.  For patients admitted to the hospital, discharge time will be determined by your treatment team.  Patients discharged the day of surgery will not be allowed to drive home.   Name and phone number of your driver:   Special instructions: Shower the night before surgery and the morning of surgery with CHG.  Please read over the following fact sheets that you were given. Pain Booklet, Coughing and Deep Breathing, Blood Transfusion Information, Total Joint Packet, MRSA Information and Surgical Site Infection Prevention

## 2014-09-10 ENCOUNTER — Encounter (HOSPITAL_COMMUNITY): Payer: Self-pay | Admitting: Emergency Medicine

## 2014-09-10 LAB — HEMOGLOBIN A1C
Hgb A1c MFr Bld: 11.3 % — ABNORMAL HIGH (ref 4.8–5.6)
Mean Plasma Glucose: 278 mg/dL

## 2014-09-10 NOTE — Progress Notes (Signed)
Anesthesia Chart Review:  Pt is 59 year old male scheduled for L total knee arthroplasty on 09/22/2014 with Dr. Maryla Morrow.   Cardiologist is Dr. Rozann Lesches, last office visit 06/11/14. Endocrinologist is Dr. Chalmers Cater.  PMH includes: HTN, DM, hyperlipidemia, GERD. Never smoker. BMI 38. S/p I&D R groin abscess 02/06/14.   Medications include: ASA, insulin (on a pump), lisinopril, metformin, metoprolol, prilosec, pravastatin, lyrica. I spoke with pt by phone about management of insulin pump the night before surgery. Pt knows to keep blood glucose under 200 using basal insulin and to suspend use of bolus insulin. Pt states he will assess blood glucose night before surgery and make adjustments to basal rate as needed. Pt reports he has had surgery many times while on insulin via pump and he knows how to manage it pre-operatively.   Preoperative labs reviewed.  HgbA1c 11.3, glucose 252. AST 93, ALT 79.   Nuclear stress test 08/06/2014:   The study is normal.  This is a low risk study.  The left ventricular ejection fraction is normal (55-65%).  There was no ST segment deviation noted during stress.  Echo 03/30/2013:  - Left ventricle: The cavity size was normal. Systolic function was normal. The estimated ejection fraction was in the range of 55% to 60%. Doppler parameters are consistent with abnormal left ventricular relaxation (grade 1 diastolic dysfunction). Mild to moderate left ventricular hypertrophy. - Aortic valve: Mildly thickened, mildly calcified leaflets. There was no stenosis. - Mitral valve: Calcified annulus. Mildly thickened leaflets. No significant regurgitation.  Pt has cardiac clearance from Dr. Domenic Polite for surgery under media tab dated 08/15/2014.   If blood glucose acceptable DOS, I anticipate pt can proceed as scheduled.   Willeen Cass, FNP-BC [facility]/Anesthesiology Phone: [phone] 09/10/2014 4:28 PM

## 2014-09-11 LAB — URINE CULTURE

## 2014-09-22 ENCOUNTER — Encounter (HOSPITAL_COMMUNITY): Admission: RE | Payer: Self-pay | Source: Ambulatory Visit

## 2014-09-22 ENCOUNTER — Inpatient Hospital Stay (HOSPITAL_COMMUNITY): Admission: RE | Admit: 2014-09-22 | Payer: Medicare Other | Source: Ambulatory Visit | Admitting: Orthopedic Surgery

## 2014-09-22 SURGERY — ARTHROPLASTY, KNEE, TOTAL
Anesthesia: General | Site: Knee | Laterality: Left

## 2014-09-24 ENCOUNTER — Other Ambulatory Visit: Payer: Self-pay | Admitting: Adult Health

## 2014-10-07 ENCOUNTER — Encounter: Payer: Medicare Other | Attending: Endocrinology | Admitting: Nutrition

## 2014-10-07 ENCOUNTER — Encounter: Payer: Self-pay | Admitting: Nutrition

## 2014-10-07 VITALS — Ht 72.0 in | Wt 284.0 lb

## 2014-10-07 DIAGNOSIS — E118 Type 2 diabetes mellitus with unspecified complications: Secondary | ICD-10-CM | POA: Insufficient documentation

## 2014-10-07 DIAGNOSIS — E669 Obesity, unspecified: Secondary | ICD-10-CM | POA: Diagnosis not present

## 2014-10-07 DIAGNOSIS — Z6838 Body mass index (BMI) 38.0-38.9, adult: Secondary | ICD-10-CM | POA: Diagnosis not present

## 2014-10-07 DIAGNOSIS — E1165 Type 2 diabetes mellitus with hyperglycemia: Secondary | ICD-10-CM

## 2014-10-07 DIAGNOSIS — Z713 Dietary counseling and surveillance: Secondary | ICD-10-CM | POA: Insufficient documentation

## 2014-10-07 DIAGNOSIS — IMO0002 Reserved for concepts with insufficient information to code with codable children: Secondary | ICD-10-CM

## 2014-10-07 NOTE — Patient Instructions (Signed)
Goals:  1. Follow My Plate Method 2. Eat 45-60 grams of carbs per meal. 3. Cut out night time snacks unless a low carb vegetable or free veggies. 4. Increase water to 84 oz of water 5. Add protein to meals 6.  Avoid skipping meals. 4. Eat three balanced meals on time as discussed: B) 6-8 am L) 12-2 pm D) 5-7 pm . Test before each meal and use correction factor and bolus insulin for carbs for all meals. . May bolus 30 minutes before a meal due to slower acting insulin but do not bolus an hour before the meal.

## 2014-10-07 NOTE — Progress Notes (Signed)
Medical Nutrition Therapy:  Appt start time: 0800 end time:  0930.  Assessment:  Primary concerns today: Diabetes Type 2. Gained 4 lbs. Still has medtronic iInsulin pump loaner. He is on U 500 insulin in his pump. Suppose to get a new pump and meter in the next 2 weeks. His left knee surgery was postponed due to high blood sugars. Current A1C 10.4% which is better than 11% perviously.. Surgery has been post poned til Dec or January.. Taking pain pills and BS are running high.  Has cut back on diet sodas but still drinks some. Reading food labels now and trying to watch his carb intake. He has been trying to eat more fresh fruits and vegetables and getting away from fast foods. Still eating too many carbs of 70 for lunch and dinner meals. He has not suspended his pump like he was. BS are  Better in the upper 200's instead of 300's. Meal patterns are better than they were.  He is testing 4 times per day and bolusing correctly in pump. He is more aware of his carb intake and reading labels for carb content. He has been snacking a lot at night causing elevated blood sugars, increased insulin needs and weight gain. Reviewed pump spreadsheet with him..  C Peptide  2.2  Sees Dr. Chalmers Cater and she doesn't upload his pump.  Had the pump for 8-9 years. Went to a DM class but never got 1:1 instruction. Can't exercise much right now due to his left knee problems.Marland Kitchen Has had to take more pain meds recently due to chronic pain. Diabetes not controlled.   Diet is still excessive in calories, Carbs, fat and sodium.   Preferreded Learning Style:  No preference indicated   Learning Readiness:  Ready  Change in progress   MEDICATIONS:See list   DIETARY INTAKE:   24-hr recall:  B ( AM):  Ham and cheese omlet with 1 slice toast. OR egg,  1 slice and 1 slice toast OR BLT.  He is eating better balanced meals with more lower carb vegetables. Still eating excessive CHO per meal. Needs increased physical activity but  limited due to left knee needing a knee replacement.   Usual physical activity: LImited due to knee problems.  Estimated energy needs: 1800 calories 200 g carbohydrates 135 g protein 50 g fat  Progress Towards Goal(s):  In progress.   Nutritional Diagnosis:  NB-1.1 Food and nutrition-related knowledge deficit As related to Diabetes.  As evidenced by A1C 11.6%..    Intervention:  Nutrition counseling and diabetes education provided on disease process and function of insulin and glucose, CHO counting, meal planning, portion sizes, target ranges for blood sugars, complications of DM from poor wound healing, CKD, retinopathy, neuropathy and risks for CVD of heart attack or stroke. Discussed importance of proper use of insulin pump, testing of blood sugars, correcting for high blood sugars and dosing bolus amounts before meals. Stressed need for meal time consistency, testing 4-5 times per day and correcting for high blood sugars. Reviewed testing techniques. Goals:  1. Follow My Plate Method 2. Eat 45-60 grams of carbs per meal. 3. Cut out night time snacks unless a low carb vegetable or free veggies. 4. Increase water to 84 oz of water 5. Add protein to meals 6.  Avoid skipping meals. 4. Eat three balanced meals on time as discussed: B) 6-8 am L) 12-2 pm D) 5-7 pm . Test before each meal and use correction factor and bolus insulin for carbs  for all meals. . May bolus 30 minutes before a meal due to slower acting insulin but do not bolus an hour before the meal.  Teaching Method Utilized:  Visual Auditory Hands on  Handouts given during visit include:  The Plate Method  Meal Plan Card  Diabetes Instructions  Barriers to learning/adherence to lifestyle change: None  Demonstrated degree of understanding via:  Teach Back   Monitoring/Evaluation:  Dietary intake, exercise, meal planning, SBG, and body weight in 1-2 month(s).

## 2014-11-12 ENCOUNTER — Encounter: Payer: Medicare Other | Attending: Family Medicine | Admitting: Nutrition

## 2014-11-12 VITALS — Ht 72.0 in | Wt 283.0 lb

## 2014-11-12 DIAGNOSIS — Z713 Dietary counseling and surveillance: Secondary | ICD-10-CM | POA: Diagnosis not present

## 2014-11-12 DIAGNOSIS — IMO0002 Reserved for concepts with insufficient information to code with codable children: Secondary | ICD-10-CM

## 2014-11-12 DIAGNOSIS — E118 Type 2 diabetes mellitus with unspecified complications: Secondary | ICD-10-CM

## 2014-11-12 DIAGNOSIS — E1165 Type 2 diabetes mellitus with hyperglycemia: Secondary | ICD-10-CM | POA: Diagnosis not present

## 2014-11-12 DIAGNOSIS — Z794 Long term (current) use of insulin: Secondary | ICD-10-CM | POA: Diagnosis not present

## 2014-11-12 DIAGNOSIS — Z6838 Body mass index (BMI) 38.0-38.9, adult: Secondary | ICD-10-CM | POA: Insufficient documentation

## 2014-11-12 NOTE — Progress Notes (Signed)
Medical Nutrition Therapy:  Appt start time: 0800 end time:  0930.  Assessment:  Primary concerns today: Diabetes Type 2  Scheduled to see DR next week. No recent A1C.  Had cut back on breads and snacks. Says he is still over correcting for his blood sugars with his insulin pump and his bs aren't near goal yet. Still has loaner pump.. Not able to exercise due to needing knee replacement. Very limited mobility. BS are still high but better over all. BS in the upper 100's and  lower 200's instead of 300's. Needs to get BS down to be able to have knee surgery. Has many excuses with making a commitment to making necessary changes with his diet. Still making poor choices when eating out. Eats out for breakfast and lunch most every day. Cooks dinner meal at home sometimes. He is the cook in the house. Still on u500 insulin. Lost  1 lb since last month. Still eating more carbs than needed and not correcting adequately for best blood sugar control.  I/C ration 1:10.  Diet is still excessive in calories, Carbs, fat and sodium.  Recommend he be taken off the insulin pump and put on MDI for better blood sugar control until he has better compliance.  Lab Results  Component Value Date   HGBA1C 11.3* 09/09/2014      Preferreded Learning Style:  No preference indicated   Learning Readiness:  Ready  Change in progress   MEDICATIONS:See list   DIETARY INTAKE:   24-hr recall:  B ( AM):  Ham and cheese omlet with 1 slice toast. OR egg,  1 slice and 1 slice toast OR BLT.  L  Sandwich,  unsweet tea,   He is eating better balanced meals with more lower carb vegetables. Still eating excessive CHO per meal. Needs increased physical activity but limited due to left knee needing a knee replacement.  Dinner: Won ton soup, chicken, broccoli and leinmein noodles. Usual physical activity: LImited due to knee problems.  Estimated energy needs: 1800 calories 200 g carbohydrates 135 g protein 50 g  fat  Progress Towards Goal(s):  In progress.   Nutritional Diagnosis:  NB-1.1 Food and nutrition-related knowledge deficit As related to Diabetes.  As evidenced by A1C 11.6%..    Intervention:  Nutrition counseling and diabetes education provided on disease process and function of insulin and glucose, CHO counting, meal planning, portion sizes, target ranges for blood sugars, complications of DM from poor wound healing, CKD, retinopathy, neuropathy and risks for CVD of heart attack or stroke. Discussed importance of proper use of insulin pump, testing of blood sugars, correcting for high blood sugars and dosing bolus amounts before meals. Stressed need for meal time consistency, testing 4-5 times per day and correcting for high blood sugars. Reviewed testing techniques. Goals:  1. Follow My Plate Method 2. Eat 45-60 grams of carbs per meal. 3. Drink 5 bottles of water water 4.  Try to eat Healthy Choice and Smart Ones for meals instead of eating out. 5. Lose 1 lb per week. 6. Eat more fresh fruits and vegetables and whole grains. . Teaching Method Utilized:  Visual Auditory Hands on  Handouts given during visit include:  The Plate Method  Meal Plan Card  Diabetes Instructions  Barriers to learning/adherence to lifestyle change: None  Demonstrated degree of understanding via:  Teach Back   Monitoring/Evaluation:  Dietary intake, exercise, meal planning, SBG, and body weight in 1-2 month(s). Recommend he be taken off the insulin  pump and put on MDI for better control until he can commit to better compliance with diet and insulin administration.

## 2014-11-12 NOTE — Patient Instructions (Signed)
Goals:  1. Follow My Plate Method 2. Eat 45-60 grams of carbs per meal. 3. Drink 5 bottles of water water 4.  Try to eat Healthy Choice and Smart Ones for meals 5. Lose 1 lb per week. 6. Eat more fresh fruits and vegetables and whole grains.

## 2014-12-20 ENCOUNTER — Encounter: Payer: Self-pay | Admitting: Nutrition

## 2014-12-20 ENCOUNTER — Encounter: Payer: Medicare Other | Attending: Endocrinology | Admitting: Nutrition

## 2014-12-20 ENCOUNTER — Other Ambulatory Visit: Payer: Self-pay | Admitting: Cardiology

## 2014-12-20 VITALS — Ht 72.0 in | Wt 281.0 lb

## 2014-12-20 DIAGNOSIS — E669 Obesity, unspecified: Secondary | ICD-10-CM | POA: Diagnosis not present

## 2014-12-20 DIAGNOSIS — IMO0002 Reserved for concepts with insufficient information to code with codable children: Secondary | ICD-10-CM

## 2014-12-20 DIAGNOSIS — Z713 Dietary counseling and surveillance: Secondary | ICD-10-CM | POA: Insufficient documentation

## 2014-12-20 DIAGNOSIS — Z794 Long term (current) use of insulin: Secondary | ICD-10-CM

## 2014-12-20 DIAGNOSIS — Z6838 Body mass index (BMI) 38.0-38.9, adult: Secondary | ICD-10-CM | POA: Insufficient documentation

## 2014-12-20 DIAGNOSIS — E118 Type 2 diabetes mellitus with unspecified complications: Secondary | ICD-10-CM | POA: Insufficient documentation

## 2014-12-20 DIAGNOSIS — E1165 Type 2 diabetes mellitus with hyperglycemia: Secondary | ICD-10-CM

## 2014-12-20 NOTE — Progress Notes (Signed)
  Medical Nutrition Therapy:  Appt start time: 1400 end time:  1430.  Assessment:  Primary concerns today: Diabetes Type 2  A1C 8.4%, drown from 11.3%. His mobility is limited due to knee. Trying to avoid snacks. Scheduled for knee surgery Wed Jan 4th with Dr. Percell Miller.  Has insulin pump and having problems with software. He is using u 500 insulin in his pump. Can't down load meter. He is scheduled to get 630 G pump and suppose to get it any day now. FBS: 213  bolus 14 units.  Lunchtime 186 mg/dl: gave  11 units bolus.    Very limited mobility. BS are still high but better over all. BS in the upper 100's and  lower 200's instead of 300's. BS elevated due to chronic pain and pain meds also contributing. Still making poor food choices with higher fat, higher sodium and not enough low carb vegetables or fresh fruit.   Lab Results  Component Value Date   HGBA1C 11.3* 09/09/2014    Preferreded Learning Style:  No preference indicated   Learning Readiness:  Ready  Change in progress   MEDICATIONS:See list   DIETARY INTAKE:   24-hr recall:  B) [place] biscuit with Diet soda, coffee,  Lunch: PB sandwich on [employer] bread,, Diet CF coke   He is limited with his physical activity due to severe knee pain.    Estimated energy needs: 1800 calories 200 g carbohydrates 135 g protein 50 g fat  Progress Towards Goal(s):  In progress.   Nutritional Diagnosis:  NB-1.1 Food and nutrition-related knowledge deficit As related to Diabetes.  As evidenced by A1C 11.6%..    Intervention:  Nutrition counseling and diabetes education provided on disease process and function of insulin and glucose, CHO counting, meal planning, portion sizes, target ranges for blood sugars, complications of DM from poor wound healing, CKD, retinopathy, neuropathy and risks for CVD of heart attack or stroke. Discussed importance of proper use of insulin pump, testing of blood sugars, correcting for high blood sugars and  dosing bolus amounts before meals. Stressed need for meal time consistency, testing 4-5 times per day and correcting for high blood sugars. Reviewed testing techniques. Goals:  1. Follow My Plate Method 2. Eat 45-60 grams of carbs per meal. 3. Drink 5 bottles of water water 4. Don;t skip meals. 5. Increase fresh fruits and lower carb vegetables. 6. Cut out high fat foods of sausage, biscuits, and fast foods. 7. Get A1C down to 8% in three months.  8. Contact pump rep to go over new pump.  Teaching Method Utilized:  Visual Auditory Hands on  Handouts given during visit include:  The Plate Method  Meal Plan Card  Diabetes Instructions  Barriers to learning/adherence to lifestyle change: None  Demonstrated degree of understanding via:  Teach Back   Monitoring/Evaluation:  Dietary intake, exercise, meal planning, SBG, and body weight in 1-2 month(s). Recommend he be taken off the insulin pump and put on MDI for better control until he can commit to better compliance with diet and insulin administration.

## 2014-12-23 NOTE — Patient Instructions (Signed)
Goals:  1. Follow My Plate Method 2. Eat 45-60 grams of carbs per meal. 3. Drink 5 bottles of water water 4. Don;t skip meals. 5. Increase fresh fruits and lower carb vegetables. 6. Cut out high fat foods of sausage, biscuits, and fast foods. 7. Get A1C down to 8% in three months.  8. Contact pump rep to go over new pump.

## 2014-12-28 ENCOUNTER — Other Ambulatory Visit: Payer: Self-pay | Admitting: Physician Assistant

## 2014-12-28 NOTE — H&P (Signed)
TOTAL KNEE ADMISSION H&P  Patient is being admitted for left total knee arthroplasty.  Subjective:  Chief Complaint:left knee pain.  HPI: David Mcdowell, 59 y.o. male, has a history of pain and functional disability in the left knee due to arthritis and has failed non-surgical conservative treatments for greater than 12 weeks to includeNSAID's and/or analgesics, corticosteriod injections and viscosupplementation injections.  Onset of symptoms was gradual, starting >10 years ago with gradually worsening course since that time. The patient noted no past surgery on the left knee(s).  Patient currently rates pain in the left knee(s) at 5 out of 10 with activity. Patient has night pain, worsening of pain with activity and weight bearing, crepitus and joint swelling.  Patient has evidence of subchondral sclerosis and joint space narrowing by imaging studies. There is no active infection.  Patient Active Problem List   Diagnosis Date Noted  . Groin abscess 02/06/2014  . Abscess of groin, right 02/06/2014  . 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 Cyst of buttocks 11/25/2012  . Mixed hyperlipidemia 07/08/2012  . Dyspepsia 07/08/2012  . Precordial pain 05/04/2010  . Type 2 diabetes mellitus, uncontrolled, with neuropathy ([place]) 05/04/2010  . Essential hypertension, benign 05/04/2010   Past Medical History  Diagnosis Date  . Type 2 diabetes mellitus ([place])   . Essential hypertension, benign   . Chest tightness     ETT-Myoview 4/12: EF 58%, no scar or ischemia  . Mixed hyperlipidemia   . Neuropathy ([place])   . Myositis   . Anxiety   . Abscess   . GERD (gastroesophageal reflux disease)     takes prilosec  . Arthritis   . Recurrent kidney stones     Past Surgical History  Procedure Laterality Date  . Carpal tunnel release    . Back surgery    . Incision and drainage abscess Right 02/06/2014    Procedure: INCISION AND DRAINAGE ABSCESS right groin abcess debridement skin and subqutaneous tissue;  Surgeon:  Excell Seltzer, MD;  Location: [facility];  Service: General;  Laterality: Right;  . Arthroscopies      both knees   . Elbow surgery      both tendon repairs( both elbows)     (Not in a hospital admission) No Known Allergies  Social History  Substance Use Topics  . Smoking status: Never Smoker   . Smokeless tobacco: Never Used  . Alcohol Use: No    Family History  Problem Relation Age of Onset  . Coronary artery disease    . Heart disease Father   . Diabetes Father      Review of Systems  Constitutional: Negative.   HENT: Negative.   Eyes: Negative.   Respiratory: Negative.   Cardiovascular: Negative.   Gastrointestinal: Negative.   Genitourinary: Negative.   Musculoskeletal: Positive for joint pain.  Skin: Negative.   Neurological: Negative.   Endo/Heme/Allergies: Negative.   Psychiatric/Behavioral: Negative.     Objective:  Physical Exam  Constitutional: He is oriented to person, place, and time. He appears well-developed and well-nourished.  HENT:  Head: Normocephalic and atraumatic.  Eyes: EOM are normal. Pupils are equal, round, and reactive to light.  Neck: Normal range of motion. Neck supple.  Cardiovascular: Normal rate and regular rhythm.   Respiratory: Effort normal and breath sounds normal.  GI: Soft. Bowel sounds are normal.  Musculoskeletal:  Examination of his left knee reveals varus thrust.  Negative log roll.  Range of motion 5-100 degrees.  Moderate patellofemoral crepitus.  He is  neurovascularly intact distally.    Neurological: He is alert and oriented to person, place, and time.  Skin: Skin is warm and dry.  Psychiatric: He has a normal mood and affect. His behavior is normal. Judgment and thought content normal.    Vital signs in last 24 hours: @VSRANGES @  Labs:   Estimated body mass index is 38.10 kg/(m^2) as calculated from the following:   Height as of 12/20/14: 6' (1.829 m).   Weight as of 12/20/14: 127.461 kg (281  lb).   Imaging Review Plain radiographs demonstrate severe degenerative joint disease of the left knee(s). The overall alignment ismild varus. The bone quality appears to be fair for age and reported activity level.  Assessment/Plan:  End stage arthritis, left knee   The patient history, physical examination, clinical judgment of the provider and imaging studies are consistent with end stage degenerative joint disease of the left knee(s) and total knee arthroplasty is deemed medically necessary. The treatment options including medical management, injection therapy arthroscopy and arthroplasty were discussed at length. The risks and benefits of total knee arthroplasty were presented and reviewed. The risks due to aseptic loosening, infection, stiffness, patella tracking problems, thromboembolic complications and other imponderables were discussed. The patient acknowledged the explanation, agreed to proceed with the plan and consent was signed. Patient is being admitted for inpatient treatment for surgery, pain control, PT, OT, prophylactic antibiotics, VTE prophylaxis, progressive ambulation and ADL's and discharge planning. The patient is planning to be discharged home with home health services

## 2014-12-31 ENCOUNTER — Encounter (HOSPITAL_COMMUNITY)
Admission: RE | Admit: 2014-12-31 | Discharge: 2014-12-31 | Disposition: A | Discharge status: Home / Self Care | Payer: Medicare Other | Source: Ambulatory Visit | Attending: Orthopedic Surgery | Admitting: Orthopedic Surgery

## 2014-12-31 ENCOUNTER — Encounter (HOSPITAL_COMMUNITY): Payer: Self-pay

## 2014-12-31 DIAGNOSIS — M1712 Unilateral primary osteoarthritis, left knee: Secondary | ICD-10-CM | POA: Diagnosis not present

## 2014-12-31 DIAGNOSIS — Z01812 Encounter for preprocedural laboratory examination: Secondary | ICD-10-CM | POA: Insufficient documentation

## 2014-12-31 DIAGNOSIS — Z0183 Encounter for blood typing: Secondary | ICD-10-CM | POA: Diagnosis not present

## 2014-12-31 LAB — CBC WITH DIFFERENTIAL/PLATELET
Basophils Absolute: 0.1 10*3/uL (ref 0.0–0.1)
Basophils Relative: 1 %
EOS ABS: 0.2 10*3/uL (ref 0.0–0.7)
EOS PCT: 2 %
HCT: 44.1 % (ref 39.0–52.0)
Hemoglobin: 14 g/dL (ref 13.0–17.0)
LYMPHS ABS: 3.6 10*3/uL (ref 0.7–4.0)
LYMPHS PCT: 38 %
MCH: 27.3 pg (ref 26.0–34.0)
MCHC: 31.7 g/dL (ref 30.0–36.0)
MCV: 86.1 fL (ref 78.0–100.0)
MONO ABS: 0.8 10*3/uL (ref 0.1–1.0)
MONOS PCT: 9 %
Neutro Abs: 4.7 10*3/uL (ref 1.7–7.7)
Neutrophils Relative %: 50 %
PLATELETS: 252 10*3/uL (ref 150–400)
RBC: 5.12 MIL/uL (ref 4.22–5.81)
RDW: 13.9 % (ref 11.5–15.5)
WBC: 9.4 10*3/uL (ref 4.0–10.5)

## 2014-12-31 LAB — COMPREHENSIVE METABOLIC PANEL
ALT: 38 U/L (ref 17–63)
ANION GAP: 8 (ref 5–15)
AST: 35 U/L (ref 15–41)
Albumin: 3.6 g/dL (ref 3.5–5.0)
Alkaline Phosphatase: 54 U/L (ref 38–126)
BUN: 11 mg/dL (ref 6–20)
CALCIUM: 9.1 mg/dL (ref 8.9–10.3)
CHLORIDE: 108 mmol/L (ref 101–111)
CO2: 25 mmol/L (ref 22–32)
CREATININE: 0.9 mg/dL (ref 0.61–1.24)
Glucose, Bld: 129 mg/dL — ABNORMAL HIGH (ref 65–99)
Potassium: 4.2 mmol/L (ref 3.5–5.1)
SODIUM: 141 mmol/L (ref 135–145)
Total Bilirubin: 0.5 mg/dL (ref 0.3–1.2)
Total Protein: 6.6 g/dL (ref 6.5–8.1)

## 2014-12-31 LAB — GLUCOSE, CAPILLARY: Glucose-Capillary: 157 mg/dL — ABNORMAL HIGH (ref 65–99)

## 2014-12-31 LAB — APTT: aPTT: 32 seconds (ref 24–37)

## 2014-12-31 LAB — PROTIME-INR
INR: 1.08 (ref 0.00–1.49)
PROTHROMBIN TIME: 14.2 s (ref 11.6–15.2)

## 2014-12-31 LAB — TYPE AND SCREEN
ABO/RH(D): AB POS
Antibody Screen: NEGATIVE

## 2014-12-31 LAB — SURGICAL PCR SCREEN
MRSA, PCR: NEGATIVE
Staphylococcus aureus: POSITIVE — AB

## 2014-12-31 NOTE — Progress Notes (Signed)
Notified Almyra Free, RN Diabetes Coordinator regarding patient's surgery.

## 2014-12-31 NOTE — Progress Notes (Addendum)
Patient states Dr. Debroah Loop office has a letter of medical clearance from Dr. Chalmers Cater (endocrinologist).  David Cass, NP reviewed patient's chart and spoke with patient after patient's prior PAT appointment in September.  Patient's surgery was previously cancelled d/t A1C.    Patient has cardiac clearance from August in Epic under media tab from 08/15/2014.  Patient states he has instructions from Dr. Chalmers Cater regarding his insulin pump for surgery.  States he has had 14 surgeries and is very familiar with managing his sugar before and after surgery.   EKG: 06/11/14 EPIC ECHO: 03/30/13 EPIC STRESS: 07/30/14 CATH: denies  Cardiologist: Dr. Domenic Polite

## 2014-12-31 NOTE — Pre-Procedure Instructions (Addendum)
David Mcdowell  12/31/2014      [facility] - [place], [address] [place] [address] Phone: [phone] Fax: [phone]  [facility] [address], [place] [address] Phone: [phone] Fax: [phone]    Your procedure is scheduled on Wednesday January 4th 2017 at 200PM.  Report to [place] at 1200 PM.  Call this number if you have problems the morning of surgery:  [phone]   Call this number if you have problems in the days leading up to your surgery:  [phone]    Remember:  Do not eat food or drink liquids after midnight Tuesday January 3rd.  Take these medicines the morning of surgery with A SIP OF WATER Metoprolol Succinate (Toprol-XL), omeprazole (prilosec) if needed, lyrica (pregabalin), pain pill (if needed)  STOP: ALL Vitamins, Supplements, Effient and Herbal Medications, Fish Oils, Aspirins, NSAIDs (Nonsteroidal Anti-inflammatories such as Ibuprofen, Aleve, or Advil), and Goody's/BC Powders 7 days prior to surgery, until after surgery as directed by your physician.    How to Manage Your Diabetes Before Surgery   Why is it important to control my blood sugar before and after surgery?   Improving blood sugar levels before and after surgery helps healing and can limit problems.  A way of improving blood sugar control is eating a healthy diet by:  - Eating less sugar and carbohydrates  - Increasing activity/exercise  - Talk with your doctor about reaching your blood sugar goals  High blood sugars (greater than 180 mg/dL) can raise your risk of infections and slow down your recovery so you will need to focus on controlling your diabetes during the weeks before surgery.  Make sure that the doctor who takes care of your diabetes knows about your planned surgery including the date and location.  How do I manage my blood sugars  before surgery?   Check your blood sugar at least 4 times a day, 2 days before surgery to make sure that they are not too high or low.   Check your blood sugar the morning of your surgery when you wake up and every 2               hours until you get to the Short-Stay unit.  If your blood sugar is less than 70 mg/dL, you will need to treat for low blood sugar by:  Treat a low blood sugar (less than 70 mg/dL) with 1/2 cup of clear juice (cranberry or apple), 4 glucose tablets, OR glucose gel.  Recheck blood sugar in 15 minutes after treatment (to make sure it is greater than 70 mg/dL).  If blood sugar is not greater than 70 mg/dL on re-check, call [phone] for further instructions.   Report your blood sugar to the Short-Stay nurse when you get to Short-Stay.  References:  [facility], 2007 "How to Manage your Diabetes Before and After Surgery".  What do I do about my diabetes medications?   Do not take oral diabetes medicines (pills) the morning of surgery. (METFORMIN)    Do not take other diabetes injectables the day of surgery including Byetta, Victoza, Bydureon, and Trulicity.    If your CBG is greater than 220 mg/dL, you may take 1/2 of your sliding scale (correction) dose of insulin.   For patients with "Insulin Pumps":  Contact your diabetes doctor  for specific instructions before surgery.   Follow Dr. Almetta Lovely instructions she has given you for surgery.  Note that if your surgery is planned to be longer than 2 hours, your insulin pump will be removed and intravenous (IV) insulin will be started and managed by the nurses and anesthesiologist.  You will be able to restart your insulin pump once you are awake and able to manage it.  Make sure to bring insulin pump supplies to the hospital with you in case your site needs to be changed.      Do not wear jewelry.  Do not wear lotions, powders, or perfumes.  You may wear deodorant.  Do not  shave 48 hours prior to surgery.  Men may shave face and neck.  Do not bring valuables to the hospital.  [facility] is not responsible for any belongings or valuables.  Contacts, dentures or bridgework may not be worn into surgery.  Leave your suitcase in the car.  After surgery it may be brought to your room.  For patients admitted to the hospital, discharge time will be determined by your treatment team.  Patients discharged the day of surgery will not be allowed to drive home.   Please read over the following fact sheets that you were given. Pain Booklet, Coughing and Deep Breathing, Blood Transfusion Information, MRSA Information and Surgical Site Infection Prevention

## 2014-12-31 NOTE — Progress Notes (Signed)
Mupirocin Ointment Rx called into [facility] in [place] for positive PCR of staph. Pt notified and voiced understanding.

## 2014-12-31 NOTE — Progress Notes (Signed)
   12/31/14 1216  OBSTRUCTIVE SLEEP APNEA  Have you ever been diagnosed with sleep apnea through a sleep study? No  Do you snore loudly (loud enough to be heard through closed doors)?  1  Do you often feel tired, fatigued, or sleepy during the daytime (such as falling asleep during driving or talking to someone)? 0  Has anyone observed you stop breathing during your sleep? 0  Do you have, or are you being treated for high blood pressure? 1  BMI more than 35 kg/m2? 1  Age > 50 (1-yes) 1  Neck circumference greater than:Male 16 inches or larger, Male 17inches or larger? 1 (19.5)  Male Gender (Yes=1) 1  Obstructive Sleep Apnea Score 6  Score 5 or greater  Results sent to PCP

## 2015-01-01 LAB — HEMOGLOBIN A1C
Hgb A1c MFr Bld: 8.6 % — ABNORMAL HIGH (ref 4.8–5.6)
MEAN PLASMA GLUCOSE: 200 mg/dL

## 2015-01-01 LAB — URINE CULTURE

## 2015-01-05 ENCOUNTER — Encounter: Payer: Self-pay | Admitting: Cardiology

## 2015-01-05 ENCOUNTER — Ambulatory Visit (INDEPENDENT_AMBULATORY_CARE_PROVIDER_SITE_OTHER): Payer: Medicare Other | Admitting: Cardiology

## 2015-01-05 VITALS — BP 136/80 | HR 100 | Ht 72.0 in | Wt 288.0 lb

## 2015-01-05 DIAGNOSIS — I10 Essential (primary) hypertension: Secondary | ICD-10-CM

## 2015-01-05 DIAGNOSIS — Z01810 Encounter for preprocedural cardiovascular examination: Secondary | ICD-10-CM

## 2015-01-05 NOTE — Progress Notes (Signed)
Cardiology Office Note  Date: 01/05/2015   ID: David Mcdowell, DOB [birth year], MRN [id number]  PCP: Purvis Kilts, MD  Primary Cardiologist: Rozann Lesches, MD   Chief Complaint  Patient presents with  . Hypertension  . History of chest pain    History of Present Illness: David Mcdowell is a 59 y.o. male last seen in June. He presents for a routine follow-up visit. Records reviewed, he is scheduled to undergo left total knee arthroplasty with Dr. Percell Miller next week. He reports having a significant amount of knee pain when he ambulates, with limited activity.  Ischemic testing from July was low risk as outlined below. He does not report any exertional chest pain. He continues on medical therapy for management of hypertension including Toprol-XL and lisinopril.  He continues to follow with Endocrinology, on insulin pump.  Past Medical History  Diagnosis Date  . Type 2 diabetes mellitus ([place])   . Essential hypertension, benign   . Chest tightness     ETT-Myoview 4/12: EF 58%, no scar or ischemia  . Mixed hyperlipidemia   . Neuropathy ([place])   . Myositis   . Anxiety   . Abscess   . GERD (gastroesophageal reflux disease)     takes prilosec  . Arthritis   . Recurrent kidney stones     Current Outpatient Prescriptions  Medication Sig Dispense Refill  . aspirin 81 MG tablet Take 81 mg by mouth daily.      Marland Kitchen HUMULIN R 500 UNIT/ML injection 2 Units by Continuous infusion (non-IV) route continuous. 3 different basal rates 3 different bolus rates Use U-100 when hospitalized    . HYDROcodone-acetaminophen (NORCO/VICODIN) 5-325 MG per tablet Take 1 tablet by mouth 2 (two) times daily.     . Insulin Human (INSULIN PUMP) SOLN Inject into the skin daily. Insulin Concentrated (Humulin R-500) unit/ml    . lisinopril (PRINIVIL,ZESTRIL) 40 MG tablet TAKE ONE TABLET BY MOUTH ONCE DAILY 90 tablet 3  . metFORMIN (GLUCOPHAGE) 1000 MG tablet Take 1,000 mg by mouth 2 (two) times daily  with a meal.     . metoprolol succinate (TOPROL-XL) 50 MG 24 hr tablet TAKE ONE TABLET BY MOUTH ONCE DAILY 90 tablet 0  . omeprazole (PRILOSEC) 20 MG capsule TAKE ONE CAPSULE BY MOUTH ONCE DAILY 90 capsule 0  . pravastatin (PRAVACHOL) 80 MG tablet TAKE ONE TABLET BY MOUTH ONCE DAILY 90 tablet 3  . pregabalin (LYRICA) 100 MG capsule Take 100 mg by mouth 3 (three) times daily. 2-3 times daily as needed     No current facility-administered medications for this visit.   Allergies:  Review of patient's allergies indicates no known allergies.   Social History: The patient  reports that he has never smoked. He has never used smokeless tobacco. He reports that he does not drink alcohol or use illicit drugs.   ROS:  Please see the history of present illness. Otherwise, complete review of systems is positive for bilateral knee pain, left worse than right.  All other systems are reviewed and negative.   Physical Exam: VS:  BP 136/80 mmHg  Pulse 100  Ht 6' (1.829 m)  Wt 288 lb (130.636 kg)  BMI 39.05 kg/m2  SpO2 97%, BMI Body mass index is 39.05 kg/(m^2).  Wt Readings from Last 3 Encounters:  01/05/15 288 lb (130.636 kg)  12/31/14 283 lb 3.2 oz (128.459 kg)  12/20/14 281 lb (127.461 kg)    Obese male, comfortable at rest.  HEENT: Conjunctiva  and lids normal, oropharynx clear.  Neck: Supple, no elevated JVP or carotid bruits, no thyromegaly.  Lungs: Clear to auscultation, nonlabored breathing at rest.  Cardiac: Regular rate and rhythm, no S3 or significant systolic murmur, no pericardial rub.  Abdomen: Soft, nontender, bowel sounds present.  Extremities: No pitting edema, distal pulses 2+.   ECG: Tracing from 06/11/2014 showed normal sinus rhythm.  Recent Labwork: 12/31/2014: ALT 38; AST 35; BUN 11; Creatinine, Ser 0.90; Hemoglobin 14.0; Platelets 252; Potassium 4.2; Sodium 141     Component Value Date/Time   CHOL 175 06/13/2011 0833   TRIG 161.0* 06/13/2011 0833   HDL 48.80  06/13/2011 0833   CHOLHDL 4 06/13/2011 0833   VLDL 32.2 06/13/2011 0833   LDLCALC 94 06/13/2011 0833    Other Studies Reviewed Today:  Carlton Adam Cardiolite 08/06/2014:  The study is normal.  This is a low risk study.  The left ventricular ejection fraction is normal (55-65%).  There was no ST segment deviation noted during stress.  Normal resting and stress perfusion with no ischemia or infarction EF 65%  Echocardiogram 03/30/2013: Study Conclusions  - Left ventricle: The cavity size was normal. Systolic function was normal. The estimated ejection fraction was in the range of 55% to 60%. Doppler parameters are consistent with abnormal left ventricular relaxation (grade 1 diastolic dysfunction). Mild to moderate left ventricular hypertrophy. - Aortic valve: Mildly thickened, mildly calcified leaflets. There was no stenosis. - Mitral valve: Calcified annulus. Mildly thickened leaflets . No significant regurgitation.  Assessment and Plan:  1. Preoperative evaluation in a 59 year old male with hypertension, type 2 diabetes mellitus, and hyperlipidemia, anticipating left knee replacement next week. Ischemic testing from this past July was low risk. He is not reporting any angina symptoms. He should be able to proceed with planned surgery and acceptable perioperative cardiovascular risk.  2. Essential hypertension, no changes made to current regimen.  Current medicines were reviewed with the patient today.  Disposition: FU with me in 1 year.   Signed, Satira Sark, MD, [facility] 01/05/2015 1:33 PM    [facility] [place], [place], [place] [address] Phone: [phone]; Fax: [phone]

## 2015-01-05 NOTE — Patient Instructions (Signed)
Your physician wants you to follow-up in: 1 year with Dr. McDowell You will receive a reminder letter in the mail two months in advance. If you don't receive a letter, please call our office to schedule the follow-up appointment.  Your physician recommends that you continue on your current medications as directed. Please refer to the Current Medication list given to you today.  Thank you for choosing [facility]!!   

## 2015-01-11 MED ORDER — SODIUM CHLORIDE 0.9 % IV SOLN
1500.0000 mg | Freq: Once | INTRAVENOUS | Status: AC
  Administered 2015-01-12: 1500 mg via INTRAVENOUS
  Filled 2015-01-11: qty 1500

## 2015-01-11 MED ORDER — LACTATED RINGERS IV SOLN
INTRAVENOUS | Status: DC
  Administered 2015-01-12 (×2): via INTRAVENOUS

## 2015-01-11 MED ORDER — CHLORHEXIDINE GLUCONATE 4 % EX LIQD: 60.0000 mL | Freq: Once | CUTANEOUS | Status: DC

## 2015-01-11 MED ORDER — TRANEXAMIC ACID 1000 MG/10ML IV SOLN
1500.0000 mg | INTRAVENOUS | Status: DC
  Filled 2015-01-11: qty 15

## 2015-01-11 NOTE — Progress Notes (Signed)
Called and informed of time change teach back noted pt informed to be here at 1115 for surgery at 1315 voices understanding.

## 2015-01-12 ENCOUNTER — Encounter (HOSPITAL_COMMUNITY): Payer: Self-pay | Admitting: Certified Registered Nurse Anesthetist

## 2015-01-12 ENCOUNTER — Inpatient Hospital Stay (HOSPITAL_COMMUNITY)
Admission: RE | Admit: 2015-01-12 | Discharge: 2015-01-15 | DRG: 470 | Disposition: A | Discharge status: Home Health | Payer: Medicare Other | Source: Ambulatory Visit | Attending: Orthopedic Surgery | Admitting: Orthopedic Surgery

## 2015-01-12 ENCOUNTER — Inpatient Hospital Stay (HOSPITAL_COMMUNITY): Payer: Medicare Other | Admitting: Certified Registered Nurse Anesthetist

## 2015-01-12 ENCOUNTER — Inpatient Hospital Stay (HOSPITAL_COMMUNITY): Payer: Medicare Other

## 2015-01-12 ENCOUNTER — Encounter (HOSPITAL_COMMUNITY)
Admission: RE | Disposition: A | Discharge status: Home Health | Payer: Self-pay | Source: Ambulatory Visit | Attending: Orthopedic Surgery

## 2015-01-12 DIAGNOSIS — I10 Essential (primary) hypertension: Secondary | ICD-10-CM | POA: Diagnosis present

## 2015-01-12 DIAGNOSIS — Z96659 Presence of unspecified artificial knee joint: Secondary | ICD-10-CM

## 2015-01-12 DIAGNOSIS — D62 Acute posthemorrhagic anemia: Secondary | ICD-10-CM | POA: Diagnosis not present

## 2015-01-12 DIAGNOSIS — F419 Anxiety disorder, unspecified: Secondary | ICD-10-CM | POA: Diagnosis present

## 2015-01-12 DIAGNOSIS — Z6839 Body mass index (BMI) 39.0-39.9, adult: Secondary | ICD-10-CM

## 2015-01-12 DIAGNOSIS — E1140 Type 2 diabetes mellitus with diabetic neuropathy, unspecified: Secondary | ICD-10-CM | POA: Diagnosis present

## 2015-01-12 DIAGNOSIS — M1710 Unilateral primary osteoarthritis, unspecified knee: Secondary | ICD-10-CM | POA: Diagnosis present

## 2015-01-12 DIAGNOSIS — E782 Mixed hyperlipidemia: Secondary | ICD-10-CM | POA: Diagnosis present

## 2015-01-12 DIAGNOSIS — Z794 Long term (current) use of insulin: Secondary | ICD-10-CM

## 2015-01-12 DIAGNOSIS — M1712 Unilateral primary osteoarthritis, left knee: Secondary | ICD-10-CM | POA: Diagnosis present

## 2015-01-12 DIAGNOSIS — K219 Gastro-esophageal reflux disease without esophagitis: Secondary | ICD-10-CM | POA: Diagnosis present

## 2015-01-12 DIAGNOSIS — M179 Osteoarthritis of knee, unspecified: Secondary | ICD-10-CM | POA: Diagnosis present

## 2015-01-12 DIAGNOSIS — M25562 Pain in left knee: Secondary | ICD-10-CM | POA: Diagnosis present

## 2015-01-12 HISTORY — PX: TOTAL KNEE ARTHROPLASTY: SHX125

## 2015-01-12 LAB — GLUCOSE, CAPILLARY
GLUCOSE-CAPILLARY: 194 mg/dL — AB (ref 65–99)
Glucose-Capillary: 145 mg/dL — ABNORMAL HIGH (ref 65–99)
Glucose-Capillary: 173 mg/dL — ABNORMAL HIGH (ref 65–99)

## 2015-01-12 SURGERY — ARTHROPLASTY, KNEE, TOTAL
Anesthesia: General | Site: Knee | Laterality: Left

## 2015-01-12 MED ORDER — METOPROLOL TARTRATE 1 MG/ML IV SOLN
INTRAVENOUS | Status: DC | PRN
  Administered 2015-01-12 (×5): 1 mg via INTRAVENOUS

## 2015-01-12 MED ORDER — OXYCODONE HCL 5 MG PO TABS
5.0000 mg | ORAL_TABLET | Freq: Once | ORAL | Status: AC | PRN
  Administered 2015-01-12: 5 mg via ORAL

## 2015-01-12 MED ORDER — LISINOPRIL 40 MG PO TABS
40.0000 mg | ORAL_TABLET | Freq: Every day | ORAL | Status: DC
  Administered 2015-01-13 – 2015-01-15 (×3): 40 mg via ORAL
  Filled 2015-01-12 (×3): qty 1

## 2015-01-12 MED ORDER — METHOCARBAMOL 500 MG PO TABS
500.0000 mg | ORAL_TABLET | Freq: Four times a day (QID) | ORAL | Status: DC | PRN
  Administered 2015-01-12 – 2015-01-15 (×5): 500 mg via ORAL
  Filled 2015-01-12 (×4): qty 1

## 2015-01-12 MED ORDER — DOCUSATE SODIUM 100 MG PO CAPS
100.0000 mg | ORAL_CAPSULE | Freq: Two times a day (BID) | ORAL | Status: DC
  Administered 2015-01-12 – 2015-01-15 (×6): 100 mg via ORAL
  Filled 2015-01-12 (×6): qty 1

## 2015-01-12 MED ORDER — ZOLPIDEM TARTRATE 5 MG PO TABS
5.0000 mg | ORAL_TABLET | Freq: Every evening | ORAL | Status: DC | PRN
  Administered 2015-01-14: 5 mg via ORAL
  Filled 2015-01-12: qty 1

## 2015-01-12 MED ORDER — SODIUM CHLORIDE 0.9 % IJ SOLN
INTRAMUSCULAR | Status: AC
  Filled 2015-01-12: qty 10

## 2015-01-12 MED ORDER — HYDROMORPHONE HCL 1 MG/ML IJ SOLN
INTRAMUSCULAR | Status: AC
  Filled 2015-01-12: qty 1

## 2015-01-12 MED ORDER — PROPOFOL 10 MG/ML IV BOLUS
INTRAVENOUS | Status: AC
  Filled 2015-01-12: qty 20

## 2015-01-12 MED ORDER — METHOCARBAMOL 500 MG PO TABS: 500.0000 mg | ORAL_TABLET | Freq: Four times a day (QID) | ORAL | Status: DC

## 2015-01-12 MED ORDER — OXYCODONE HCL 5 MG PO TABS
5.0000 mg | ORAL_TABLET | ORAL | Status: DC | PRN
  Administered 2015-01-12 – 2015-01-15 (×13): 10 mg via ORAL
  Filled 2015-01-12 (×13): qty 2

## 2015-01-12 MED ORDER — METHOCARBAMOL 1000 MG/10ML IJ SOLN
500.0000 mg | Freq: Four times a day (QID) | INTRAVENOUS | Status: DC | PRN
  Filled 2015-01-12: qty 5

## 2015-01-12 MED ORDER — BUPIVACAINE HCL 0.5 % IJ SOLN: INTRAMUSCULAR | Status: DC | PRN

## 2015-01-12 MED ORDER — GLYCOPYRROLATE 0.2 MG/ML IJ SOLN
INTRAMUSCULAR | Status: AC
  Filled 2015-01-12: qty 1

## 2015-01-12 MED ORDER — PHENOL 1.4 % MT LIQD: 1.0000 | OROMUCOSAL | Status: DC | PRN

## 2015-01-12 MED ORDER — POLYETHYLENE GLYCOL 3350 17 G PO PACK: 17.0000 g | PACK | Freq: Every day | ORAL | Status: DC | PRN

## 2015-01-12 MED ORDER — HYDROMORPHONE HCL 1 MG/ML IJ SOLN
1.0000 mg | INTRAMUSCULAR | Status: DC | PRN
Start: 2015-01-12 — End: 2015-01-15
  Administered 2015-01-12: 1 mg via INTRAVENOUS
  Administered 2015-01-13 (×2): 2 mg via INTRAVENOUS
  Administered 2015-01-14: 1 mg via INTRAVENOUS
  Filled 2015-01-12 (×3): qty 2

## 2015-01-12 MED ORDER — ONDANSETRON HCL 4 MG/2ML IJ SOLN
INTRAMUSCULAR | Status: AC
  Filled 2015-01-12: qty 2

## 2015-01-12 MED ORDER — SODIUM CHLORIDE 0.9 % IV SOLN
INTRAVENOUS | Status: DC | PRN
  Administered 2015-01-12 (×2): via INTRAVENOUS

## 2015-01-12 MED ORDER — BUPIVACAINE LIPOSOME 1.3 % IJ SUSP
20.0000 mL | INTRAMUSCULAR | Status: AC
  Administered 2015-01-12: 20 mL
  Filled 2015-01-12: qty 20

## 2015-01-12 MED ORDER — APIXABAN 2.5 MG PO TABS
2.5000 mg | ORAL_TABLET | Freq: Two times a day (BID) | ORAL | Status: DC
  Administered 2015-01-13 – 2015-01-15 (×5): 2.5 mg via ORAL
  Filled 2015-01-12 (×5): qty 1

## 2015-01-12 MED ORDER — MAGNESIUM CITRATE PO SOLN: 1.0000 | Freq: Once | ORAL | Status: DC | PRN

## 2015-01-12 MED ORDER — BISACODYL 5 MG PO TBEC: 5.0000 mg | DELAYED_RELEASE_TABLET | Freq: Every day | ORAL | Status: DC | PRN

## 2015-01-12 MED ORDER — ONDANSETRON HCL 4 MG/2ML IJ SOLN: 4.0000 mg | Freq: Four times a day (QID) | INTRAMUSCULAR | Status: DC | PRN

## 2015-01-12 MED ORDER — BISACODYL 10 MG RE SUPP: 10.0000 mg | Freq: Every day | RECTAL | Status: DC | PRN

## 2015-01-12 MED ORDER — METOCLOPRAMIDE HCL 5 MG/ML IJ SOLN: 5.0000 mg | Freq: Three times a day (TID) | INTRAMUSCULAR | Status: DC | PRN

## 2015-01-12 MED ORDER — BUPIVACAINE IN DEXTROSE 0.75-8.25 % IT SOLN
INTRATHECAL | Status: DC | PRN
  Administered 2015-01-12: 2 mL via INTRATHECAL

## 2015-01-12 MED ORDER — PHENYLEPHRINE 40 MCG/ML (10ML) SYRINGE FOR IV PUSH (FOR BLOOD PRESSURE SUPPORT)
PREFILLED_SYRINGE | INTRAVENOUS | Status: AC
  Filled 2015-01-12: qty 10

## 2015-01-12 MED ORDER — ACETAMINOPHEN 325 MG PO TABS: 650.0000 mg | ORAL_TABLET | Freq: Four times a day (QID) | ORAL | Status: DC | PRN

## 2015-01-12 MED ORDER — SUCCINYLCHOLINE CHLORIDE 20 MG/ML IJ SOLN
INTRAMUSCULAR | Status: AC
  Filled 2015-01-12: qty 1

## 2015-01-12 MED ORDER — SODIUM CHLORIDE 0.9 % IR SOLN
Status: DC | PRN
Start: 2015-01-12 — End: 2015-01-12
  Administered 2015-01-12: 3000 mL

## 2015-01-12 MED ORDER — METHOCARBAMOL 500 MG PO TABS
ORAL_TABLET | ORAL | Status: AC
  Filled 2015-01-12: qty 1

## 2015-01-12 MED ORDER — EPHEDRINE SULFATE 50 MG/ML IJ SOLN
INTRAMUSCULAR | Status: DC | PRN
  Administered 2015-01-12: 5 mg via INTRAVENOUS

## 2015-01-12 MED ORDER — OXYCODONE HCL 5 MG PO TABS
ORAL_TABLET | ORAL | Status: AC
  Filled 2015-01-12: qty 1

## 2015-01-12 MED ORDER — INSULIN ASPART 100 UNIT/ML ~~LOC~~ SOLN: 0.0000 [IU] | Freq: Three times a day (TID) | SUBCUTANEOUS | Status: DC

## 2015-01-12 MED ORDER — INSULIN ASPART 100 UNIT/ML ~~LOC~~ SOLN
0.0000 [IU] | SUBCUTANEOUS | Status: DC
  Administered 2015-01-12: 2 [IU] via SUBCUTANEOUS

## 2015-01-12 MED ORDER — ACETAMINOPHEN 160 MG/5ML PO SOLN
325.0000 mg | ORAL | Status: DC | PRN
  Filled 2015-01-12: qty 20.3

## 2015-01-12 MED ORDER — EPHEDRINE SULFATE 50 MG/ML IJ SOLN
INTRAMUSCULAR | Status: AC
  Filled 2015-01-12: qty 1

## 2015-01-12 MED ORDER — OXYCODONE HCL 5 MG PO TABS: ORAL_TABLET | ORAL | Status: DC

## 2015-01-12 MED ORDER — DIPHENHYDRAMINE HCL 12.5 MG/5ML PO ELIX: 12.5000 mg | ORAL_SOLUTION | ORAL | Status: DC | PRN

## 2015-01-12 MED ORDER — LIDOCAINE HCL (CARDIAC) 20 MG/ML IV SOLN
INTRAVENOUS | Status: AC
  Filled 2015-01-12: qty 5

## 2015-01-12 MED ORDER — ROCURONIUM BROMIDE 50 MG/5ML IV SOLN
INTRAVENOUS | Status: AC
  Filled 2015-01-12: qty 1

## 2015-01-12 MED ORDER — PRAVASTATIN SODIUM 40 MG PO TABS
80.0000 mg | ORAL_TABLET | Freq: Every day | ORAL | Status: DC
  Administered 2015-01-13 – 2015-01-15 (×3): 80 mg via ORAL
  Filled 2015-01-12 (×3): qty 2

## 2015-01-12 MED ORDER — ONDANSETRON HCL 4 MG PO TABS: 4.0000 mg | ORAL_TABLET | Freq: Four times a day (QID) | ORAL | Status: DC | PRN

## 2015-01-12 MED ORDER — TRANEXAMIC ACID 1000 MG/10ML IV SOLN
1000.0000 mg | INTRAVENOUS | Status: DC
  Filled 2015-01-12: qty 10

## 2015-01-12 MED ORDER — METOPROLOL TARTRATE 1 MG/ML IV SOLN
INTRAVENOUS | Status: AC
  Filled 2015-01-12: qty 5

## 2015-01-12 MED ORDER — ONDANSETRON HCL 4 MG/2ML IJ SOLN
INTRAMUSCULAR | Status: DC | PRN
  Administered 2015-01-12: 4 mg via INTRAVENOUS

## 2015-01-12 MED ORDER — MIDAZOLAM HCL 2 MG/2ML IJ SOLN
INTRAMUSCULAR | Status: DC | PRN
  Administered 2015-01-12: 2 mg via INTRAVENOUS

## 2015-01-12 MED ORDER — ACETAMINOPHEN 650 MG RE SUPP: 650.0000 mg | Freq: Four times a day (QID) | RECTAL | Status: DC | PRN

## 2015-01-12 MED ORDER — LIDOCAINE HCL (CARDIAC) 20 MG/ML IV SOLN
INTRAVENOUS | Status: DC | PRN
  Administered 2015-01-12: 50 mg via INTRATRACHEAL

## 2015-01-12 MED ORDER — GLYCOPYRROLATE 0.2 MG/ML IJ SOLN
INTRAMUSCULAR | Status: DC | PRN
  Administered 2015-01-12 (×2): 0.1 mg via INTRAVENOUS

## 2015-01-12 MED ORDER — FENTANYL CITRATE (PF) 250 MCG/5ML IJ SOLN
INTRAMUSCULAR | Status: AC
  Filled 2015-01-12: qty 5

## 2015-01-12 MED ORDER — FENTANYL CITRATE (PF) 250 MCG/5ML IJ SOLN
INTRAMUSCULAR | Status: DC | PRN
  Administered 2015-01-12 (×4): 50 ug via INTRAVENOUS

## 2015-01-12 MED ORDER — PROPOFOL 10 MG/ML IV BOLUS
INTRAVENOUS | Status: DC | PRN
  Administered 2015-01-12: 20 mg via INTRAVENOUS
  Administered 2015-01-12: 30 mg via INTRAVENOUS
  Administered 2015-01-12 (×2): 20 mg via INTRAVENOUS

## 2015-01-12 MED ORDER — ONDANSETRON HCL 4 MG PO TABS: 4.0000 mg | ORAL_TABLET | Freq: Three times a day (TID) | ORAL | Status: DC | PRN

## 2015-01-12 MED ORDER — 0.9 % SODIUM CHLORIDE (POUR BTL) OPTIME: TOPICAL | Status: DC | PRN

## 2015-01-12 MED ORDER — APIXABAN 2.5 MG PO TABS: ORAL_TABLET | ORAL | Status: DC

## 2015-01-12 MED ORDER — PHENYLEPHRINE HCL 10 MG/ML IJ SOLN
10.0000 mg | INTRAMUSCULAR | Status: DC | PRN
  Administered 2015-01-12: 50 ug/min via INTRAVENOUS

## 2015-01-12 MED ORDER — METOPROLOL SUCCINATE ER 50 MG PO TB24
50.0000 mg | ORAL_TABLET | Freq: Every day | ORAL | Status: DC
Start: 2015-01-13 — End: 2015-01-15
  Administered 2015-01-13 – 2015-01-15 (×3): 50 mg via ORAL
  Filled 2015-01-12 (×3): qty 1

## 2015-01-12 MED ORDER — PANTOPRAZOLE SODIUM 40 MG PO TBEC
40.0000 mg | DELAYED_RELEASE_TABLET | Freq: Every day | ORAL | Status: DC
  Administered 2015-01-13 – 2015-01-15 (×3): 40 mg via ORAL
  Filled 2015-01-12 (×3): qty 1

## 2015-01-12 MED ORDER — ACETAMINOPHEN 325 MG PO TABS: 325.0000 mg | ORAL_TABLET | ORAL | Status: DC | PRN

## 2015-01-12 MED ORDER — OXYCODONE HCL 5 MG/5ML PO SOLN: 5.0000 mg | Freq: Once | ORAL | Status: AC | PRN

## 2015-01-12 MED ORDER — MENTHOL 3 MG MT LOZG: 1.0000 | LOZENGE | OROMUCOSAL | Status: DC | PRN

## 2015-01-12 MED ORDER — PROPOFOL 500 MG/50ML IV EMUL
INTRAVENOUS | Status: DC | PRN
  Administered 2015-01-12: 50 ug/kg/min via INTRAVENOUS

## 2015-01-12 MED ORDER — ARTIFICIAL TEARS OP OINT
TOPICAL_OINTMENT | OPHTHALMIC | Status: AC
  Filled 2015-01-12: qty 3.5

## 2015-01-12 MED ORDER — METFORMIN HCL 500 MG PO TABS
1000.0000 mg | ORAL_TABLET | Freq: Two times a day (BID) | ORAL | Status: DC
  Administered 2015-01-12 – 2015-01-15 (×6): 1000 mg via ORAL
  Filled 2015-01-12 (×5): qty 2

## 2015-01-12 MED ORDER — PREGABALIN 100 MG PO CAPS
100.0000 mg | ORAL_CAPSULE | Freq: Three times a day (TID) | ORAL | Status: DC
  Administered 2015-01-12 – 2015-01-15 (×8): 100 mg via ORAL
  Filled 2015-01-12 (×8): qty 1

## 2015-01-12 MED ORDER — MIDAZOLAM HCL 2 MG/2ML IJ SOLN
INTRAMUSCULAR | Status: AC
  Filled 2015-01-12: qty 2

## 2015-01-12 MED ORDER — METOCLOPRAMIDE HCL 5 MG PO TABS: 5.0000 mg | ORAL_TABLET | Freq: Three times a day (TID) | ORAL | Status: DC | PRN

## 2015-01-12 MED ORDER — VANCOMYCIN HCL IN DEXTROSE 1-5 GM/200ML-% IV SOLN
1000.0000 mg | Freq: Two times a day (BID) | INTRAVENOUS | Status: AC
  Administered 2015-01-13: 1000 mg via INTRAVENOUS
  Filled 2015-01-12 (×2): qty 200

## 2015-01-12 MED ORDER — POTASSIUM CHLORIDE IN NACL 20-0.9 MEQ/L-% IV SOLN
INTRAVENOUS | Status: DC
  Administered 2015-01-12 – 2015-01-14 (×3): via INTRAVENOUS
  Filled 2015-01-12 (×4): qty 1000

## 2015-01-12 MED ORDER — PHENYLEPHRINE HCL 10 MG/ML IJ SOLN
INTRAMUSCULAR | Status: DC | PRN
  Administered 2015-01-12 (×2): 120 ug via INTRAVENOUS

## 2015-01-12 MED ORDER — HYDROMORPHONE HCL 1 MG/ML IJ SOLN
0.2500 mg | INTRAMUSCULAR | Status: DC | PRN
  Administered 2015-01-12 (×2): 0.5 mg via INTRAVENOUS

## 2015-01-12 MED ORDER — BUPIVACAINE HCL (PF) 0.5 % IJ SOLN
INTRAMUSCULAR | Status: AC
  Filled 2015-01-12: qty 30

## 2015-01-12 MED ORDER — HYDROMORPHONE HCL 1 MG/ML IJ SOLN
0.5000 mg | INTRAMUSCULAR | Status: DC | PRN
  Administered 2015-01-12: 1 mg via INTRAVENOUS
  Filled 2015-01-12 (×3): qty 1

## 2015-01-12 SURGICAL SUPPLY — 68 items
0.5% BUPIVICAINE 30 ML VIAL IMPLANT
APL SKNCLS STERI-STRIP NONHPOA (GAUZE/BANDAGES/DRESSINGS) ×1
BANDAGE ELASTIC 4 VELCRO ST LF (GAUZE/BANDAGES/DRESSINGS) ×3 IMPLANT
BANDAGE ELASTIC 6 VELCRO ST LF (GAUZE/BANDAGES/DRESSINGS) ×3 IMPLANT
BANDAGE ESMARK 6X9 LF (GAUZE/BANDAGES/DRESSINGS) ×1 IMPLANT
BENZOIN TINCTURE PRP APPL 2/3 (GAUZE/BANDAGES/DRESSINGS) ×3 IMPLANT
BLADE SAG 18X100X1.27 (BLADE) ×6 IMPLANT
BNDG CMPR 9X6 STRL LF SNTH (GAUZE/BANDAGES/DRESSINGS) ×1
BNDG ESMARK 6X9 LF (GAUZE/BANDAGES/DRESSINGS) ×3
BOWL SMART MIX CTS (DISPOSABLE) ×3 IMPLANT
CAPT KNEE TOTAL 3 ×2 IMPLANT
CEMENT BONE SIMPLEX SPEEDSET (Cement) ×6 IMPLANT
CLOSURE WOUND 1/2 X4 (GAUZE/BANDAGES/DRESSINGS) ×1
COVER SURGICAL LIGHT HANDLE (MISCELLANEOUS) ×3 IMPLANT
CUFF TOURNIQUET SINGLE 34IN LL (TOURNIQUET CUFF) ×3 IMPLANT
DRAPE EXTREMITY T 121X128X90 (DRAPE) ×3 IMPLANT
DRAPE IMP U-DRAPE 54X76 (DRAPES) ×3 IMPLANT
DRAPE PROXIMA HALF (DRAPES) ×3 IMPLANT
DRAPE U-SHAPE 47X51 STRL (DRAPES) ×3 IMPLANT
DRSG PAD ABDOMINAL 8X10 ST (GAUZE/BANDAGES/DRESSINGS) ×3 IMPLANT
DURAPREP 26ML APPLICATOR (WOUND CARE) ×4 IMPLANT
ELECT CAUTERY BLADE 6.4 (BLADE) ×3 IMPLANT
ELECT REM PT RETURN 9FT ADLT (ELECTROSURGICAL) ×3
ELECTRODE REM PT RTRN 9FT ADLT (ELECTROSURGICAL) ×1 IMPLANT
EVACUATOR 1/8 PVC DRAIN (DRAIN) ×3 IMPLANT
FACESHIELD WRAPAROUND (MASK) ×6 IMPLANT
FACESHIELD WRAPAROUND OR TEAM (MASK) ×2 IMPLANT
GAUZE SPONGE 4X4 12PLY STRL (GAUZE/BANDAGES/DRESSINGS) ×3 IMPLANT
GLOVE BIOGEL PI IND STRL 7.0 (GLOVE) ×2 IMPLANT
GLOVE BIOGEL PI INDICATOR 7.0 (GLOVE) ×4
GLOVE ORTHO TXT STRL SZ7.5 (GLOVE) ×3 IMPLANT
GLOVE SURG ORTHO 7.0 STRL STRW (GLOVE) ×3 IMPLANT
GLOVE SURG SS PI 7.0 STRL IVOR (GLOVE) ×2 IMPLANT
GLOVE SURG SS PI 8.0 STRL IVOR (GLOVE) ×2 IMPLANT
GOWN STRL REUS W/ TWL LRG LVL3 (GOWN DISPOSABLE) ×2 IMPLANT
GOWN STRL REUS W/ TWL XL LVL3 (GOWN DISPOSABLE) ×1 IMPLANT
GOWN STRL REUS W/TWL LRG LVL3 (GOWN DISPOSABLE) ×6
GOWN STRL REUS W/TWL XL LVL3 (GOWN DISPOSABLE) ×3
HANDPIECE INTERPULSE COAX TIP (DISPOSABLE) ×3
IMMOBILIZER KNEE 22 UNIV (SOFTGOODS) ×3 IMPLANT
KIT BASIN OR (CUSTOM PROCEDURE TRAY) ×3 IMPLANT
KIT ROOM TURNOVER OR (KITS) ×3 IMPLANT
MANIFOLD NEPTUNE II (INSTRUMENTS) ×3 IMPLANT
NDL 18GX1X1/2 (RX/OR ONLY) (NEEDLE) ×1 IMPLANT
NDL HYPO 25GX1X1/2 BEV (NEEDLE) ×1 IMPLANT
NEEDLE 18GX1X1/2 (RX/OR ONLY) (NEEDLE) ×3 IMPLANT
NEEDLE HYPO 25GX1X1/2 BEV (NEEDLE) ×3 IMPLANT
NS IRRIG 1000ML POUR BTL (IV SOLUTION) ×3 IMPLANT
PACK TOTAL JOINT (CUSTOM PROCEDURE TRAY) ×3 IMPLANT
PACK UNIVERSAL I (CUSTOM PROCEDURE TRAY) ×3 IMPLANT
PAD ARMBOARD 7.5X6 YLW CONV (MISCELLANEOUS) ×6 IMPLANT
PAD CAST 4YDX4 CTTN HI CHSV (CAST SUPPLIES) ×1 IMPLANT
PADDING CAST COTTON 4X4 STRL (CAST SUPPLIES) ×3
SET HNDPC FAN SPRY TIP SCT (DISPOSABLE) ×1 IMPLANT
STRIP CLOSURE SKIN 1/2X4 (GAUZE/BANDAGES/DRESSINGS) ×3 IMPLANT
SUCTION FRAZIER TIP 10 FR DISP (SUCTIONS) ×1 IMPLANT
SUT MNCRL AB 4-0 PS2 18 (SUTURE) ×3 IMPLANT
SUT VIC AB 0 CT1 27 (SUTURE) ×6
SUT VIC AB 0 CT1 27XBRD ANBCTR (SUTURE) IMPLANT
SUT VIC AB 1 CTX 36 (SUTURE) ×6
SUT VIC AB 1 CTX36XBRD ANBCTR (SUTURE) ×1 IMPLANT
SUT VIC AB 2-0 CT1 27 (SUTURE) ×6
SUT VIC AB 2-0 CT1 TAPERPNT 27 (SUTURE) ×2 IMPLANT
SYR 50ML LL SCALE MARK (SYRINGE) ×3 IMPLANT
SYR CONTROL 10ML LL (SYRINGE) ×3 IMPLANT
TOWEL OR 17X24 6PK STRL BLUE (TOWEL DISPOSABLE) ×3 IMPLANT
TOWEL OR 17X26 10 PK STRL BLUE (TOWEL DISPOSABLE) ×3 IMPLANT
TRAY CATH 16FR W/PLASTIC CATH (SET/KITS/TRAYS/PACK) ×3 IMPLANT

## 2015-01-12 NOTE — Progress Notes (Signed)
Orthopedic Tech Progress Note Patient Details:  David Mcdowell [birth year] [id number]  CPM Left Knee CPM Left Knee: On Left Knee Flexion (Degrees): 90 Left Knee Extension (Degrees): 0 Ortho has no ohf at this time; one will be provided when it becomes available; RN notified  Hildred Priest 01/12/2015, 3:06 PM

## 2015-01-12 NOTE — Progress Notes (Signed)
Called pharm to discuss pts insulin pump - ok to leave in room with family, pt is not to use pump while here. Pt agrees. Called Dr Percell Miller at [phone] to ask for BG and sliding scale change to q4hrs per pt request. Pt also requests increased pain medications - this RN gave him 1mg  of dilaudid and 10mg  oxy IR - pt stated it is not enough and he takes pain meds at home as well. New orders obtained by MD's PA.

## 2015-01-12 NOTE — H&P (View-Only) (Signed)
TOTAL KNEE ADMISSION H&P  Patient is being admitted for left total knee arthroplasty.  Subjective:  Chief Complaint:left knee pain.  HPI: David Mcdowell, 60 y.o. male, has a history of pain and functional disability in the left knee due to arthritis and has failed non-surgical conservative treatments for greater than 12 weeks to includeNSAID's and/or analgesics, corticosteriod injections and viscosupplementation injections.  Onset of symptoms was gradual, starting >10 years ago with gradually worsening course since that time. The patient noted no past surgery on the left knee(s).  Patient currently rates pain in the left knee(s) at 5 out of 10 with activity. Patient has night pain, worsening of pain with activity and weight bearing, crepitus and joint swelling.  Patient has evidence of subchondral sclerosis and joint space narrowing by imaging studies. There is no active infection.  Patient Active Problem List   Diagnosis Date Noted  . Groin abscess 02/06/2014  . Abscess of groin, right 02/06/2014  . 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 Cyst of buttocks 11/25/2012  . Mixed hyperlipidemia 07/08/2012  . Dyspepsia 07/08/2012  . Precordial pain 05/04/2010  . Type 2 diabetes mellitus, uncontrolled, with neuropathy ([place]) 05/04/2010  . Essential hypertension, benign 05/04/2010   Past Medical History  Diagnosis Date  . Type 2 diabetes mellitus ([place])   . Essential hypertension, benign   . Chest tightness     ETT-Myoview 4/12: EF 58%, no scar or ischemia  . Mixed hyperlipidemia   . Neuropathy ([place])   . Myositis   . Anxiety   . Abscess   . GERD (gastroesophageal reflux disease)     takes prilosec  . Arthritis   . Recurrent kidney stones     Past Surgical History  Procedure Laterality Date  . Carpal tunnel release    . Back surgery    . Incision and drainage abscess Right 02/06/2014    Procedure: INCISION AND DRAINAGE ABSCESS right groin abcess debridement skin and subqutaneous tissue;  Surgeon:  Excell Seltzer, MD;  Location: [facility];  Service: General;  Laterality: Right;  . Arthroscopies      both knees   . Elbow surgery      both tendon repairs( both elbows)     (Not in a hospital admission) No Known Allergies  Social History  Substance Use Topics  . Smoking status: Never Smoker   . Smokeless tobacco: Never Used  . Alcohol Use: No    Family History  Problem Relation Age of Onset  . Coronary artery disease    . Heart disease Father   . Diabetes Father      Review of Systems  Constitutional: Negative.   HENT: Negative.   Eyes: Negative.   Respiratory: Negative.   Cardiovascular: Negative.   Gastrointestinal: Negative.   Genitourinary: Negative.   Musculoskeletal: Positive for joint pain.  Skin: Negative.   Neurological: Negative.   Endo/Heme/Allergies: Negative.   Psychiatric/Behavioral: Negative.     Objective:  Physical Exam  Constitutional: He is oriented to person, place, and time. He appears well-developed and well-nourished.  HENT:  Head: Normocephalic and atraumatic.  Eyes: EOM are normal. Pupils are equal, round, and reactive to light.  Neck: Normal range of motion. Neck supple.  Cardiovascular: Normal rate and regular rhythm.   Respiratory: Effort normal and breath sounds normal.  GI: Soft. Bowel sounds are normal.  Musculoskeletal:  Examination of his left knee reveals varus thrust.  Negative log roll.  Range of motion 5-100 degrees.  Moderate patellofemoral crepitus.  He is  neurovascularly intact distally.    Neurological: He is alert and oriented to person, place, and time.  Skin: Skin is warm and dry.  Psychiatric: He has a normal mood and affect. His behavior is normal. Judgment and thought content normal.    Vital signs in last 24 hours: @VSRANGES @  Labs:   Estimated body mass index is 38.10 kg/(m^2) as calculated from the following:   Height as of 12/20/14: 6' (1.829 m).   Weight as of 12/20/14: 127.461 kg (281  lb).   Imaging Review Plain radiographs demonstrate severe degenerative joint disease of the left knee(s). The overall alignment ismild varus. The bone quality appears to be fair for age and reported activity level.  Assessment/Plan:  End stage arthritis, left knee   The patient history, physical examination, clinical judgment of the provider and imaging studies are consistent with end stage degenerative joint disease of the left knee(s) and total knee arthroplasty is deemed medically necessary. The treatment options including medical management, injection therapy arthroscopy and arthroplasty were discussed at length. The risks and benefits of total knee arthroplasty were presented and reviewed. The risks due to aseptic loosening, infection, stiffness, patella tracking problems, thromboembolic complications and other imponderables were discussed. The patient acknowledged the explanation, agreed to proceed with the plan and consent was signed. Patient is being admitted for inpatient treatment for surgery, pain control, PT, OT, prophylactic antibiotics, VTE prophylaxis, progressive ambulation and ADL's and discharge planning. The patient is planning to be discharged home with home health services

## 2015-01-12 NOTE — Transfer of Care (Signed)
Immediate Anesthesia Transfer of Care Note  Patient: David Mcdowell  Procedure(s) Performed: Procedure(s): LEFT TOTAL KNEE ARTHROPLASTY (Left)  Patient Location: PACU  Anesthesia Type:General  Level of Consciousness: awake, alert  and oriented  Airway & Oxygen Therapy: Patient connected to face mask oxygen  Post-op Assessment: Report given to RN  Post vital signs: stable  Last Vitals:  Filed Vitals:   01/12/15 1115  BP: 153/79  Pulse: 100  Temp: 36.6 C  Resp: 20    Complications: No apparent anesthesia complications

## 2015-01-12 NOTE — Interval H&P Note (Signed)
History and Physical Interval Note:  01/12/2015 8:25 AM  David Mcdowell  has presented today for surgery, with the diagnosis of djd left knee  The various methods of treatment have been discussed with the patient and family. After consideration of risks, benefits and other options for treatment, the patient has consented to  Procedure(s): LEFT TOTAL KNEE ARTHROPLASTY (Left) as a surgical intervention .  The patient's history has been reviewed, patient examined, no change in status, stable for surgery.  I have reviewed the patient's chart and labs.  Questions were answered to the patient's satisfaction.     David Mcdowell

## 2015-01-12 NOTE — Anesthesia Procedure Notes (Signed)
Spinal Patient location during procedure: OR Staffing Anesthesiologist: Charisa Twitty Preanesthetic Checklist Completed: patient identified, surgical consent, pre-op evaluation, timeout performed, IV checked, risks and benefits discussed and monitors and equipment checked Spinal Block Patient position: sitting Prep: site prepped and draped and DuraPrep Patient monitoring: heart rate, cardiac monitor, continuous pulse ox and blood pressure Approach: midline Location: L3-4 Injection technique: single-shot Needle Needle type: Pencan  Needle gauge: 24 G Needle length: 10 cm Assessment Sensory level: T6   

## 2015-01-12 NOTE — Anesthesia Preprocedure Evaluation (Signed)
Anesthesia Evaluation  Patient identified by MRN, date of birth, ID band Patient awake    Reviewed: Allergy & Precautions, NPO status , Patient's Chart, lab work & pertinent test results  History of Anesthesia Complications Negative for: history of anesthetic complications  Airway Mallampati: III  TM Distance: >3 FB Neck ROM: Full    Dental  (+) Teeth Intact   Pulmonary pneumonia,    breath sounds clear to auscultation       Cardiovascular hypertension, Pt. on medications and Pt. on home beta blockers  Rhythm:Regular     Neuro/Psych PSYCHIATRIC DISORDERS Anxiety  Neuromuscular disease    GI/Hepatic Neg liver ROS, GERD  Medicated and Controlled,  Endo/Other  diabetes, Type 2, Insulin DependentMorbid obesity  Renal/GU negative Renal ROS     Musculoskeletal  (+) Arthritis ,   Abdominal   Peds  Hematology negative hematology ROS (+)   Anesthesia Other Findings   Reproductive/Obstetrics                             Anesthesia Physical Anesthesia Plan  ASA: II  Anesthesia Plan: MAC and Spinal   Post-op Pain Management:    Induction: Intravenous  Airway Management Planned: Nasal Cannula, Natural Airway and Simple Face Mask  Additional Equipment: None  Intra-op Plan:   Post-operative Plan:   Informed Consent: I have reviewed the patients History and Physical, chart, labs and discussed the procedure including the risks, benefits and alternatives for the proposed anesthesia with the patient or authorized representative who has indicated his/her understanding and acceptance.   Dental advisory given  Plan Discussed with: CRNA and Surgeon  Anesthesia Plan Comments:         Anesthesia Quick Evaluation

## 2015-01-12 NOTE — Progress Notes (Signed)
Orthopedic Tech Progress Note Patient Details:  David Mcdowell 01/17/1955 [id number]  Ortho Devices Ortho Device/Splint Location: applied ohf to bed Ortho Device/Splint Interventions: Ordered, Application   [name] 01/12/2015, 5:56 PM

## 2015-01-12 NOTE — Anesthesia Postprocedure Evaluation (Signed)
Anesthesia Post Note  Patient: David Mcdowell  Procedure(s) Performed: Procedure(s) (LRB): LEFT TOTAL KNEE ARTHROPLASTY (Left)  Patient location during evaluation: PACU Anesthesia Type: General Level of consciousness: awake and alert Pain management: pain level controlled Vital Signs Assessment: post-procedure vital signs reviewed and stable Respiratory status: spontaneous breathing, nonlabored ventilation, respiratory function stable and patient connected to nasal cannula oxygen Cardiovascular status: blood pressure returned to baseline and stable Postop Assessment: no signs of nausea or vomiting Anesthetic complications: no    Last Vitals:  Filed Vitals:   01/12/15 1624 01/12/15 1638  BP: 143/84 150/90  Pulse: 97 95  Temp:    Resp: 15 15    Last Pain: There were no vitals filed for this visit.               Effie Berkshire

## 2015-01-12 NOTE — Discharge Summary (Addendum)
Patient ID: David Mcdowell MRN: [id number] DOB/AGE: 1955/02/10 60 y.o.  Admit date: 01/12/2015 Discharge date: 01/14/2015  Admission Diagnoses:  Active Problems:   DJD (degenerative joint disease) of knee   Discharge Diagnoses:  Same  Past Medical History  Diagnosis Date  . Type 2 diabetes mellitus ([place])   . Essential hypertension, benign   . Chest tightness     ETT-Myoview 4/12: EF 58%, no scar or ischemia  . Mixed hyperlipidemia   . Neuropathy ([place])   . Myositis   . Anxiety   . Abscess   . GERD (gastroesophageal reflux disease)     takes prilosec  . Arthritis   . Recurrent kidney stones     Surgeries: Procedure(s): LEFT TOTAL KNEE ARTHROPLASTY on 01/12/2015   Consultants:    Discharged Condition: Improved  Hospital Course: David Mcdowell is an 60 y.o. male who was admitted 01/12/2015 for operative treatment of primary localized osteoarthritis left knee. Patient has severe unremitting pain that affects sleep, daily activities, and work/hobbies. After pre-op clearance the patient was taken to the operating room on 01/12/2015 and underwent  Procedure(s): LEFT TOTAL KNEE ARTHROPLASTY.  Patient with a  Pre-op Hb of 14.0 developed ABLA on pod#1 with a HB of  12.3.  He is currently stable but we will continue to follow. Patient was given perioperative antibiotics:      Anti-infectives    Start     Dose/Rate Route Frequency Ordered Stop   01/13/15 0000  vancomycin (VANCOCIN) IVPB 1000 mg/200 mL premix     1,000 mg 200 mL/hr over 60 Minutes Intravenous Every 12 hours 01/12/15 1726 01/13/15 0319   01/12/15 1230  vancomycin (VANCOCIN) 1,500 mg in sodium chloride 0.9 % 500 mL IVPB     1,500 mg 250 mL/hr over 120 Minutes Intravenous  Once 01/11/15 0918 01/12/15 1306       Patient was given sequential compression devices, early ambulation, and chemoprophylaxis to prevent DVT.  Patient benefited maximally from hospital stay and there were no complications.    Recent vital signs:   Patient Vitals for the past 24 hrs:  BP Temp Temp src Pulse Resp SpO2  01/14/15 0500 (!) 128/59 mmHg 98 F (36.7 C) Oral (!) 108 20 95 %  01/13/15 2100 (!) 127/58 mmHg 98.5 F (36.9 C) Oral (!) 127 20 92 %  01/13/15 1300 138/65 mmHg 99.6 F (37.6 C) Oral (!) 111 18 96 %  01/13/15 0842 139/68 mmHg 99.1 F (37.3 C) Oral (!) 115 18 95 %     Recent laboratory studies:   Recent Labs  01/13/15 0500 01/13/15 0735  WBC  --  11.1*  HGB  --  12.3*  HCT  --  39.0  PLT  --  268  NA 136  --   K 4.9  --   CL 103  --   CO2 26  --   BUN 13  --   CREATININE 0.86  --   GLUCOSE 291*  --   CALCIUM 8.3*  --      Discharge Medications:     Medication List    STOP taking these medications        aspirin 81 MG tablet     HYDROcodone-acetaminophen 5-325 MG tablet  Commonly known as:  NORCO/VICODIN      TAKE these medications        apixaban 2.5 MG Tabs tablet  Commonly known as:  ELIQUIS  Take 1 tab po x12 hours x 21 days  following surgery to prevent blood clots     bisacodyl 5 MG EC tablet  Commonly known as:  DULCOLAX  Take 1 tablet (5 mg total) by mouth daily as needed for moderate constipation.     HUMULIN R 500 UNIT/ML injection  Generic drug:  insulin regular human CONCENTRATED  2 Units by Continuous infusion (non-IV) route continuous. 3 different basal rates 3 different bolus rates Use U-100 when hospitalized     insulin pump Soln  Inject into the skin daily. Insulin Concentrated (Humulin R-500) unit/ml     lisinopril 40 MG tablet  Commonly known as:  PRINIVIL,ZESTRIL  TAKE ONE TABLET BY MOUTH ONCE DAILY     metFORMIN 1000 MG tablet  Commonly known as:  GLUCOPHAGE  Take 1,000 mg by mouth 2 (two) times daily with a meal.     methocarbamol 500 MG tablet  Commonly known as:  ROBAXIN  Take 1 tablet (500 mg total) by mouth 4 (four) times daily.     metoprolol succinate 50 MG 24 hr tablet  Commonly known as:  TOPROL-XL  TAKE ONE TABLET BY MOUTH ONCE DAILY      omeprazole 20 MG capsule  Commonly known as:  PRILOSEC  TAKE ONE CAPSULE BY MOUTH ONCE DAILY     ondansetron 4 MG tablet  Commonly known as:  ZOFRAN  Take 1 tablet (4 mg total) by mouth every 8 (eight) hours as needed for nausea or vomiting.     oxyCODONE-acetaminophen 7.5-325 MG tablet  Commonly known as:  PERCOCET  Take 1-2 tabs po q 4-6 hours prn pain     pravastatin 80 MG tablet  Commonly known as:  PRAVACHOL  TAKE ONE TABLET BY MOUTH ONCE DAILY     pregabalin 100 MG capsule  Commonly known as:  LYRICA  Take 100 mg by mouth 3 (three) times daily. 2-3 times daily as needed        Diagnostic Studies: Dg Knee Left Port  01/12/2015  CLINICAL DATA:  Status post total left knee arthroplasty EXAM: PORTABLE LEFT KNEE - 1-2 VIEW COMPARISON:  04/02/2011 FINDINGS: Hardware components of a left total knee arthroplasty are identified. The hardware components are in anatomic alignment. No complications identified. Gas is identified within the surrounding soft tissues. IMPRESSION: 1. Status post left total knee arthroplasty. Electronically Signed   By: Kerby Moors M.D.   On: 01/12/2015 15:32    Disposition: 01-Home or Self Care    Follow-up Information    Follow up with [name], MD. Schedule an appointment as soon as possible for a visit in 2 weeks.   Specialty:  Orthopedic Surgery   Contact information:   [place] [address] [phone]       Follow up with [facility].   Why:  They will contact you to schedule home therapy visits.   Contact information:   [address] [place] [address] [phone]        Signed: Fannie Knee 01/14/2015, 6:06 AM

## 2015-01-13 ENCOUNTER — Encounter (HOSPITAL_COMMUNITY): Payer: Self-pay | Admitting: Orthopedic Surgery

## 2015-01-13 LAB — GLUCOSE, CAPILLARY
GLUCOSE-CAPILLARY: 146 mg/dL — AB (ref 65–99)
GLUCOSE-CAPILLARY: 233 mg/dL — AB (ref 65–99)
GLUCOSE-CAPILLARY: 265 mg/dL — AB (ref 65–99)
Glucose-Capillary: 276 mg/dL — ABNORMAL HIGH (ref 65–99)
Glucose-Capillary: 277 mg/dL — ABNORMAL HIGH (ref 65–99)
Glucose-Capillary: 254 mg/dL — ABNORMAL HIGH (ref 65–99)

## 2015-01-13 LAB — CBC
HEMATOCRIT: 39 % (ref 39.0–52.0)
Hemoglobin: 12.3 g/dL — ABNORMAL LOW (ref 13.0–17.0)
MCH: 27.6 pg (ref 26.0–34.0)
MCHC: 31.5 g/dL (ref 30.0–36.0)
MCV: 87.4 fL (ref 78.0–100.0)
Platelets: 268 10*3/uL (ref 150–400)
RBC: 4.46 MIL/uL (ref 4.22–5.81)
RDW: 14.2 % (ref 11.5–15.5)
WBC: 11.1 10*3/uL — AB (ref 4.0–10.5)

## 2015-01-13 LAB — BASIC METABOLIC PANEL
ANION GAP: 7 (ref 5–15)
BUN: 13 mg/dL (ref 6–20)
CHLORIDE: 103 mmol/L (ref 101–111)
CO2: 26 mmol/L (ref 22–32)
Calcium: 8.3 mg/dL — ABNORMAL LOW (ref 8.9–10.3)
Creatinine, Ser: 0.86 mg/dL (ref 0.61–1.24)
GFR calc Af Amer: >60 mL/min (ref >60)
GLUCOSE: 291 mg/dL — AB (ref 65–99)
POTASSIUM: 4.9 mmol/L (ref 3.5–5.1)
Sodium: 136 mmol/L (ref 135–145)

## 2015-01-13 MED ORDER — INSULIN DETEMIR 100 UNIT/ML ~~LOC~~ SOLN
5.0000 [IU] | Freq: Every day | SUBCUTANEOUS | Status: DC
  Administered 2015-01-13: 5 [IU] via SUBCUTANEOUS
  Filled 2015-01-13: qty 0.05

## 2015-01-13 MED ORDER — INSULIN ASPART 100 UNIT/ML ~~LOC~~ SOLN
0.0000 [IU] | SUBCUTANEOUS | Status: DC
  Administered 2015-01-13: 8 [IU] via SUBCUTANEOUS

## 2015-01-13 MED ORDER — OXYCODONE-ACETAMINOPHEN 7.5-325 MG PO TABS: ORAL_TABLET | ORAL | Status: DC

## 2015-01-13 MED ORDER — INSULIN PUMP
Freq: Three times a day (TID) | SUBCUTANEOUS | Status: DC
  Administered 2015-01-13: 70 via SUBCUTANEOUS
  Administered 2015-01-13: 58 via SUBCUTANEOUS
  Administered 2015-01-13: 22:00:00 via SUBCUTANEOUS
  Administered 2015-01-14: 35 via SUBCUTANEOUS
  Administered 2015-01-14: 12:00:00 via SUBCUTANEOUS
  Administered 2015-01-14: 35 via SUBCUTANEOUS
  Administered 2015-01-14: 15 via SUBCUTANEOUS
  Administered 2015-01-15: 41 via SUBCUTANEOUS
  Administered 2015-01-15: 37.5 via SUBCUTANEOUS
  Filled 2015-01-13: qty 1

## 2015-01-13 NOTE — Progress Notes (Signed)
Spoke with patient about his diabetes and insulin pump. Was diagnosed with DM about 28 years ago. Has been on an insulin pump for 10 years. Is now on U-500 insulin in the insulin pump and has been for about 6-7 years.  Sees Dr.Ballen as his endocrinologist. Has the 630G Medtronic pump with site having been changed yesterday. (01/12/15) Started pump this am after breakfast. Spoke with in house pharmacist to inform them that the insulin in his pump is U-500. Staff RN is to have patient sign Patient Contract and keep in shadow chart.    U 500 insulin: Insulin pump settings:   CHO coverage 1:8 gms     Sensitivity=20     Blood sugar target: 120 mg/dl 12 am-4 am  0.475 U /hr. 4 am-11 am 0.950 U /hr. 11 am-1 pm 1.50 U /hr. 1 pm-4 pm  1.55 U / hr. 4 pm-9 pm 0.925 U / hr. 9 pm-12 am  0.900 U/hr. Total: 23.525 Units  Will continue to monitor blood sugars while patient is in the hospital. Harvel Ricks RN BSN CDE

## 2015-01-13 NOTE — Discharge Instructions (Signed)
INSTRUCTIONS AFTER JOINT REPLACEMENT   o Remove items at home which could result in a fall. This includes throw rugs or furniture in walking pathways o ICE to the affected joint every three hours while awake for 30 minutes at a time, for at least the first 3-5 days, and then as needed for pain and swelling.  Continue to use ice for pain and swelling. You may notice swelling that will progress down to the foot and ankle.  This is normal after surgery.  Elevate your leg when you are not up walking on it.   o Continue to use the breathing machine you got in the hospital (incentive spirometer) which will help keep your temperature down.  It is common for your temperature to cycle up and down following surgery, especially at night when you are not up moving around and exerting yourself.  The breathing machine keeps your lungs expanded and your temperature down.   DIET:  As you were doing prior to hospitalization, we recommend a well-balanced diet.  DRESSING / WOUND CARE / SHOWERING  You may change your dressing 3-5 days after surgery.  Then change the dressing every day with sterile gauze.  Please use good hand washing techniques before changing the dressing.  Do not use any lotions or creams on the incision until instructed by your surgeon. and You may shower 3 days after surgery, but keep the wounds dry during showering.  You may use an occlusive plastic wrap (Press'n Seal for example), NO SOAKING/SUBMERGING IN THE BATHTUB.  If the bandage gets wet, change with a clean dry gauze.  If the incision gets wet, pat the wound dry with a clean towel.  ACTIVITY  o Increase activity slowly as tolerated, but follow the weight bearing instructions below.   o No driving for 6 weeks or until further direction given by your physician.  You cannot drive while taking narcotics.  o No lifting or carrying greater than 10 lbs. until further directed by your surgeon. o Avoid periods of inactivity such as sitting longer  than an hour when not asleep. This helps prevent blood clots.  o You may return to work once you are authorized by your doctor.     WEIGHT BEARING   Weight bearing as tolerated with assist device (walker, cane, etc) as directed, use it as long as suggested by your surgeon or therapist, typically at least 4-6 weeks.   EXERCISES  Results after joint replacement surgery are often greatly improved when you follow the exercise, range of motion and muscle strengthening exercises prescribed by your doctor. Safety measures are also important to protect the joint from further injury. Any time any of these exercises cause you to have increased pain or swelling, decrease what you are doing until you are comfortable again and then slowly increase them. If you have problems or questions, call your caregiver or physical therapist for advice.   Rehabilitation is important following a joint replacement. After just a few days of immobilization, the muscles of the leg can become weakened and shrink (atrophy).  These exercises are designed to build up the tone and strength of the thigh and leg muscles and to improve motion. Often times heat used for twenty to thirty minutes before working out will loosen up your tissues and help with improving the range of motion but do not use heat for the first two weeks following surgery (sometimes heat can increase post-operative swelling).   These exercises can be done on a training (  exercise) mat, on the floor, on a table or on a bed. Use whatever works the best and is most comfortable for you.    Use music or television while you are exercising so that the exercises are a pleasant break in your day. This will make your life better with the exercises acting as a break in your routine that you can look forward to.   Perform all exercises about fifteen times, three times per day or as directed.  You should exercise both the operative leg and the other leg as well.  Exercises  include:    Quad Sets - Tighten up the muscle on the front of the thigh (Quad) and hold for 5-10 seconds.    Straight Leg Raises - With your knee straight (if you were given a brace, keep it on), lift the leg to 60 degrees, hold for 3 seconds, and slowly lower the leg.  Perform this exercise against resistance later as your leg gets stronger.   Leg Slides: Lying on your back, slowly slide your foot toward your buttocks, bending your knee up off the floor (only go as far as is comfortable). Then slowly slide your foot back down until your leg is flat on the floor again.   Angel Wings: Lying on your back spread your legs to the side as far apart as you can without causing discomfort.   Hamstring Strength:  Lying on your back, push your heel against the floor with your leg straight by tightening up the muscles of your buttocks.  Repeat, but this time bend your knee to a comfortable angle, and push your heel against the floor.  You may put a pillow under the heel to make it more comfortable if necessary.   A rehabilitation program following joint replacement surgery can speed recovery and prevent re-injury in the future due to weakened muscles. Contact your doctor or a physical therapist for more information on knee rehabilitation.    CONSTIPATION  Constipation is defined medically as fewer than three stools per week and severe constipation as less than one stool per week.  Even if you have a regular bowel pattern at home, your normal regimen is likely to be disrupted due to multiple reasons following surgery.  Combination of anesthesia, postoperative narcotics, change in appetite and fluid intake all can affect your bowels.   YOU MUST use at least one of the following options; they are listed in order of increasing strength to get the job done.  They are all available over the counter, and you may need to use some, POSSIBLY even all of these options:    Drink plenty of fluids (prune juice may be  helpful) and high fiber foods Colace 100 mg by mouth twice a day  Senokot for constipation as directed and as needed Dulcolax (bisacodyl), take with full glass of water  Miralax (polyethylene glycol) once or twice a day as needed.  If you have tried all these things and are unable to have a bowel movement in the first 3-4 days after surgery call either your surgeon or your primary doctor.    If you experience loose stools or diarrhea, hold the medications until you stool forms back up.  If your symptoms do not get better within 1 week or if they get worse, check with your doctor.  If you experience "the worst abdominal pain ever" or develop nausea or vomiting, please contact the office immediately for further recommendations for treatment.   ITCHING:  If  you experience itching with your medications, try taking only a single pain pill, or even half a pain pill at a time.  You can also use Benadryl over the counter for itching or also to help with sleep.   TED HOSE STOCKINGS:  Use stockings on both legs until for at least 2 weeks or as directed by physician office. They may be removed at night for sleeping.  MEDICATIONS:  See your medication summary on the After Visit Summary that nursing will review with you.  You may have some home medications which will be placed on hold until you complete the course of blood thinner medication.  It is important for you to complete the blood thinner medication as prescribed.  PRECAUTIONS:  If you experience chest pain or shortness of breath - call 911 immediately for transfer to the hospital emergency department.   If you develop a fever greater that 101 F, purulent drainage from wound, increased redness or drainage from wound, foul odor from the wound/dressing, or calf pain - CONTACT YOUR SURGEON.                                                   FOLLOW-UP APPOINTMENTS:  If you do not already have a post-op appointment, please call the office for an  appointment to be seen by your surgeon.  Guidelines for how soon to be seen are listed in your After Visit Summary, but are typically between 1-4 weeks after surgery.  OTHER INSTRUCTIONS:   Knee Replacement:  Do not place pillow under knee, focus on keeping the knee straight while resting. CPM instructions: 0-90 degrees, 2 hours in the morning, 2 hours in the afternoon, and 2 hours in the evening. Place foam block, curve side up under heel at all times except when in CPM or when walking.  DO NOT modify, tear, cut, or change the foam block in any way.  MAKE SURE YOU:   Understand these instructions.   Get help right away if you are not doing well or get worse.    Thank you for letting us be a part of your medical care team.  It is a privilege we respect greatly.  We hope these instructions will help you stay on track for a fast and full recovery!   Information on my medicine - ELIQUIS (apixaban)  This medication education was reviewed with me or my healthcare representative as part of my discharge preparation.  The pharmacist that spoke with me during my hospital stay was:  Eudelia Bunch, [facility]  Why was Eliquis prescribed for you? Eliquis was prescribed for you to reduce the risk of blood clots forming after orthopedic surgery.    What do You need to know about Eliquis? Take your Eliquis TWICE DAILY - one tablet in the morning and one tablet in the evening with or without food.  It would be best to take the dose about the same time each day.  If you have difficulty swallowing the tablet whole please discuss with your pharmacist how to take the medication safely.  Take Eliquis exactly as prescribed by your doctor and DO NOT stop taking Eliquis without talking to the doctor who prescribed the medication.  Stopping without other medication to take the place of Eliquis may increase your risk of developing a clot.  After discharge, you should  have regular check-up appointments with  your healthcare provider that is prescribing your Eliquis.  What do you do if you miss a dose? If a dose of ELIQUIS is not taken at the scheduled time, take it as soon as possible on the same day and twice-daily administration should be resumed.  The dose should not be doubled to make up for a missed dose.  Do not take more than one tablet of ELIQUIS at the same time.  Important Safety Information A possible side effect of Eliquis is bleeding. You should call your healthcare provider right away if you experience any of the following: ? Bleeding from an injury or your nose that does not stop. ? Unusual colored urine (red or dark brown) or unusual colored stools (red or black). ? Unusual bruising for unknown reasons. ? A serious fall or if you hit your head (even if there is no bleeding).  Some medicines may interact with Eliquis and might increase your risk of bleeding or clotting while on Eliquis. To help avoid this, consult your healthcare provider or pharmacist prior to using any new prescription or non-prescription medications, including herbals, vitamins, non-steroidal anti-inflammatory drugs (NSAIDs) and supplements.  This website has more information on Eliquis (apixaban): http://www.eliquis.com/eliquis/home

## 2015-01-13 NOTE — Op Note (Signed)
David Mcdowell, QUILLAN NO.:  [id number]  MEDICAL RECORD NO.:  [id number]  LOCATION:  5N20C                        FACILITY:  [facility]  PHYSICIAN:  [name], M.D. DATE OF BIRTH:  September 17, 1955  DATE OF PROCEDURE:  01/12/2015 DATE OF DISCHARGE:                              OPERATIVE REPORT   PREOPERATIVE DIAGNOSIS:  End-stage arthritis, left knee, primary localized.  POSTOPERATIVE DIAGNOSIS:  End-stage arthritis, left knee, primary localized.  PROCEDURE:  Modified minimally-invasive left total knee replacement utilizing Stryker Triathlon prosthesis.  Cemented pegged posterior stabilized #5 femoral component.  Cemented #5 tibial component with a 9- mm PS insert.  Cemented resurfacing 35-mm patellar component.  SURGEON:  [name], M.D.  ASSISTANT:  Elmyra Ricks, PA, present throughout the entire case and necessary for timely completion of procedure.  ANESTHESIA:  Spinal.  BLOOD LOSS:  Minimal.  SPECIMENS:  None.  CULTURES:  None.  COMPLICATIONS:  None.  DRESSINGS:  Soft compressive.  TOURNIQUET TIME:  45 minutes.  DESCRIPTION OF PROCEDURE:  The patient was brought to the operating room and placed on the operating table in supine position.  After adequate anesthesia had been obtained, tourniquet applied, prepped and draped in usual sterile fashion.  Exsanguinated with elevation of Esmarch. Tourniquet inflated to 350 mmHg.  Longitudinal incision above the patella down to tibial tubercle.  Medial arthrotomy, vastus splitting preserving quad tendon.  Intramedullary guide, distal femur.  An 8-mm resection, 5 degrees of valgus.  Using epicondylar axis, the femur was sized, cut and fitted for a pegged posterior stabilized #5 component. Extramedullary guide, proximal tibia.  A 3-degree posterior slope cut. Sized for #5 component.  Patella exposed.  Posterior 10-mm was removed. Drill was sized and fitted for a 35-mm component.  Trials were put  in place.  A 9-mm insert.  With this construct, very pleased with mechanical axis, balance in flexion and extension, stability, tracking. Tibia was marked for rotation and hand ream.  All trials were removed. Copious irrigation with a pulse irrigating device.  Cement prepared, placed on all components, firmly seated.  Polyethylene attached to tibia and knee reduced.  Patella held with a clamp.  Once the cement hardened, wound irrigated again.  Soft tissue was injected with Exparel. Arthrotomy was closed with #1 Vicryl.  Subcutaneous and subcuticular closure.  Sterile compressive dressing applied.  Tourniquet was deflated and removed.  Knee immobilizer applied.  Anesthesia reversed.  Brought to the recovery room.  Tolerated the surgery well.  No complications.     [name], M.D.     DFM/MEDQ  D:  01/13/2015  T:  01/13/2015  Job:  [id number]

## 2015-01-13 NOTE — Progress Notes (Signed)
PT Cancellation Note  Patient Details Name: David Mcdowell MRN: [id number] DOB: 10-20-1955   Cancelled Treatment:    Reason Eval/Treat Not Completed: Other (comment) (Pt with OT earlier and now with nursing about insulin pump).  Will try back later.   Ramond Dial 01/13/2015, 12:02 PM   Mee Hives, PT MS Acute Rehab Dept. Number: [facility] [id number] and [place] [phone]

## 2015-01-13 NOTE — Progress Notes (Signed)
Occupational Therapy Evaluation Patient Details Name: David Mcdowell MRN: [id number] DOB: [birth year] Today's Date: 01/13/2015    History of Present Illness 59 y.o. male s/p L TKA. PMH significant for DM type II, HTN, neuropathy, myositis, anxiety, GERD, abscess, and arthritis.   Clinical Impression   PTA, pt was independent with all ADLs and functional mobility. Pt currently presents with acute L knee pain, nausea, and low O2 sats on room air. Pt required mod +2 assist for sit-stand and stand-pivot transfers to [facility] and chair. Pt plans to d/c home with wife who is only able to provide supervision level assistance. Pt will benefit from continued skilled OT to increase independence and safety with ADLs and mobility and to decrease caregiver burden. Recommending HHOT initially for ADL retraining and fall risk evaluation. Also recommending 3in1 for safe transfers.    Follow Up Recommendations  Home health OT;Supervision/Assistance - 24 hour    Equipment Recommendations  3 in 1 bedside comode - pt's wife stated that they will purchase a tub bench on their own if necessary    Recommendations for Other Services       Precautions / Restrictions Precautions Precautions: Knee;Fall Precaution Booklet Issued: No Restrictions Weight Bearing Restrictions: Yes LLE Weight Bearing: Weight bearing as tolerated      Mobility Bed Mobility Overal bed mobility: Needs Assistance Bed Mobility: Supine to Sit     Supine to sit: Mod assist;HOB elevated     General bed mobility comments: HOB elevated ~30 degrees, use of bedrails. Mod assist to progress LLE off bed and hand-held assist to pull up into sitting. Once EOB pt's O2 sats dropped to 88% on 2L of O2 - cued to take deep breaths and O2 sats returned to 93% after 2 minutes.  Transfers Overall transfer level: Needs assistance Equipment used: Rolling walker (2 wheeled) Transfers: Sit to/from [facility] Sit to Stand: Mod assist;+2  safety/equipment Stand pivot transfers: Mod assist;+2 safety/equipment       General transfer comment: Mod +2 assist for safety. Required boost to stand, stabilization of RW and assist to maintain balance once standing. Verbal cues for safe hand placement on seated surfaces. On room air pt's O2 remained between 90-93% during transfers with cues for pursed lip breathing    Balance Overall balance assessment: Needs assistance Sitting-balance support: No upper extremity supported;Feet supported Sitting balance-Leahy Scale: Fair Sitting balance - Comments: Unable to weight shift   Standing balance support: Bilateral upper extremity supported;During functional activity Standing balance-Leahy Scale: Poor Standing balance comment: Heavy reliance on RW for UE support - physical assist to regain balance upon standing                            ADL Overall ADL's : Needs assistance/impaired Eating/Feeding: Modified independent   Grooming: Wash/dry hands;Wash/dry face;Set up;Sitting                   Toilet Transfer: Moderate assistance;+2 for safety/equipment;Cueing for safety;Cueing for sequencing;BSC;RW;Stand-pivot Toilet Transfer Details (indicate cue type and reason): Cues for step sequence and safe hand/body positioning Toileting- Clothing Manipulation and Hygiene: Maximal assistance;Sit to/from stand       Functional mobility during ADLs: Moderate assistance;+2 for safety/equipment;Rolling walker General ADL Comments: Mod +2 assist for safety. Pt nauseous and diaphoretic upon sitting EOB - resolved after ~7 minutes. Pt completed stand-pivot transfer to [facility] and to chair with mod +2 assist and verbal cues throughout for safety.  Vision Vision Assessment?: No apparent visual deficits   Perception     Praxis      Pertinent Vitals/Pain Pain Assessment: 0-10 Pain Score: 7  Pain Location: L knee Pain Descriptors / Indicators: Aching;Sore Pain Intervention(s):  Limited activity within patient's tolerance;Monitored during session;Premedicated before session;Repositioned;Ice applied     Hand Dominance Right   Extremity/Trunk Assessment Upper Extremity Assessment Upper Extremity Assessment: Overall WFL for tasks assessed   Lower Extremity Assessment Lower Extremity Assessment: LLE deficits/detail LLE Deficits / Details: weakness as expected s/p surgery   Cervical / Trunk Assessment Cervical / Trunk Assessment: Normal   Communication Communication Communication: No difficulties   Cognition Arousal/Alertness: Lethargic;Suspect due to medications Behavior During Therapy: [facility] for tasks assessed/performed Overall Cognitive Status: Within Functional Limits for tasks assessed                     General Comments       Exercises       Shoulder Instructions      Home Living Family/patient expects to be discharged to:: Private residence Living Arrangements: Spouse/significant other Available Help at Discharge: Family;Available 24 hours/day Type of Home: House Home Access: Stairs to enter [employer] of Steps: 2 Entrance Stairs-Rails: None Home Layout: One level     Bathroom Shower/Tub: Tub/shower unit;Curtain Shower/tub characteristics: Architectural technologist: Handicapped height     Home Equipment: Environmental consultant - 2 wheels;Hand held shower head   Additional Comments: Pt's wife can only provide min guard-supervision level assist.      Prior Functioning/Environment Level of Independence: Independent        Comments: Pt reports he probably should've been using AD for ambulation    OT Diagnosis: Generalized weakness;Acute pain   OT Problem List: Decreased strength;Decreased range of motion;Decreased activity tolerance;Impaired balance (sitting and/or standing);Decreased coordination;Decreased safety awareness;Decreased knowledge of use of DME or AE;Decreased knowledge of precautions;Obesity;Pain   OT  Treatment/Interventions: Self-care/ADL training;Therapeutic exercise;Energy conservation;DME and/or AE instruction;Therapeutic activities;Patient/family education;Balance training    OT Goals(Current goals can be found in the care plan section) Acute Rehab OT Goals Patient Stated Goal: to decrease pain OT Goal Formulation: With patient Time For Goal Achievement: 01/27/15 Potential to Achieve Goals: Good ADL Goals Pt Will Perform Grooming: with supervision;standing Pt Will Perform Lower Body Bathing: with caregiver independent in assisting;sit to/from stand;with supervision Pt Will Perform Lower Body Dressing: with supervision;sit to/from stand;with caregiver independent in assisting Pt Will Transfer to Toilet: with supervision;ambulating;bedside commode Pt Will Perform Toileting - Clothing Manipulation and hygiene: with supervision;sitting/lateral leans;sit to/from stand Pt Will Perform Tub/Shower Transfer: Tub transfer;ambulating;tub bench;rolling walker  OT Frequency: Min 2X/week   Barriers to D/C: Decreased caregiver support  Wife only able to provide supervision level assist       Co-evaluation              End of Session Equipment Utilized During Treatment: Gait belt;Rolling walker CPM Left Knee CPM Left Knee: Off Nurse Communication: Mobility status;Precautions;Weight bearing status  Activity Tolerance: Patient limited by pain;Patient limited by fatigue Patient left: in chair;with call bell/phone within reach;with family/visitor present   Time: [id number] OT Time Calculation (min): 50 min Charges:  OT General Charges $OT Visit: 1 Procedure OT Evaluation $OT Eval Moderate Complexity: 1 Procedure OT Treatments $Self Care/Home Management : 23-37 mins G-Codes:    Redmond Baseman, OTR/L Pager: [phone] 01/13/2015, 11:08 AM

## 2015-01-13 NOTE — Care Management Note (Addendum)
Case Management Note  Patient Details  Name: KHYRIN MABERY MRN: [id number] Date of Birth: 1955-05-11  Subjective/Objective:      S/p left total knee arthroplasty              Action/Plan: Set up with [facility] for HHPT by MD office. Spoke with patient and his daughter, no change in discharge plan. Per Ruby Cola with [facility] and [place] will be delivered to home and 3N1 to patient's room.Patient stated that he has a rolling walker and family will be available to assist after discharge.   PT recommending SNF unless patient makes progress with PT and OT. Consult to CSW but will continue to follow .  Expected Discharge Date:                  Expected Discharge Plan:  [place]  In-House Referral:  NA  Discharge planning Services  CM Consult  Post Acute Care Choice:  Durable Medical Equipment, Home Health Choice offered to:  Patient  DME Arranged:  CPM, 3-N-1 DME Agency:  [employer]  HH Arranged:  PT [place] Agency:  [facility]  Status of Service:  Completed, signed off  Medicare Important Message Given:    Date Medicare IM Given:    Medicare IM give by:    Date Additional Medicare IM Given:    Additional Medicare Important Message give by:     If discussed at [place] of Stay Meetings, dates discussed:    Additional Comments:  Nila Nephew, RN 01/13/2015, 12:26 PM

## 2015-01-13 NOTE — Progress Notes (Signed)
Utilization review completed.  

## 2015-01-13 NOTE — Evaluation (Signed)
Physical Therapy Evaluation Patient Details Name: David Mcdowell MRN: [id number] DOB: 22-Jul-1955 Today's Date: 01/13/2015   History of Present Illness  59 y.o. male s/p L TKA. PMH significant for DM type II, HTN, neuropathy, myositis, anxiety, GERD, abscess, and arthritis.  Clinical Impression  Pt was seen for evaluation of his mobility after attempting to see all morning and getting a limited tolerance for activity.  Pt is unexpectedly unable to move well, spoke with case manager to get process of rehab started.  He may be able to step past this if he has a couple more days of therapy.    Follow Up Recommendations SNF    Equipment Recommendations  None recommended by PT    Recommendations for Other Services Rehab consult     Precautions / Restrictions Precautions Precautions: Knee;Fall Precaution Booklet Issued: No Required Braces or Orthoses: Knee Immobilizer - Left Knee Immobilizer - Left: On when out of bed or walking Restrictions Weight Bearing Restrictions: Yes LLE Weight Bearing: Weight bearing as tolerated      Mobility  Bed Mobility Overal bed mobility: Needs Assistance;+2 for physical assistance;+ 2 for safety/equipment Bed Mobility: Sit to Supine       Sit to supine: Mod assist;+2 for physical assistance;+2 for safety/equipment;HOB elevated (used trapeze for LUE)      Transfers Overall transfer level: Needs assistance Equipment used: Rolling walker (2 wheeled) Transfers: Sit to/from [facility] Sit to Stand: Mod assist;+2 physical assistance;+2 safety/equipment Stand pivot transfers: Mod assist;+2 safety/equipment;+2 physical assistance       General transfer comment: 2 person to sit from standing due to help controlling sitting and another to help with kicking out LLE brace  Ambulation/Gait Ambulation/Gait assistance: Mod assist;+2 physical assistance;+2 safety/equipment Ambulation Distance (Feet): 4 Feet Assistive device: Rolling  walker (2 wheeled);2 person hand held assist (L knee immobilizer) Gait Pattern/deviations: Step-to pattern;Trunk flexed;Wide base of support;Decreased weight shift to left;Decreased dorsiflexion - left;Decreased stance time - left Gait velocity: reduced Gait velocity interpretation: Below normal speed for age/gender General Gait Details: minimized use of LLE with immobilizer on and had increased pain to sidestep up the bed to the left  Stairs            Wheelchair Mobility    Modified Rankin (Stroke Patients Only)       Balance Overall balance assessment: Needs assistance Sitting-balance support: Feet supported Sitting balance-Leahy Scale: Good     Standing balance support: Bilateral upper extremity supported Standing balance-Leahy Scale: Fair Standing balance comment: fair once set, fair- with dynamic mobility                             Pertinent Vitals/Pain Pain Assessment: 0-10 Pain Score: 8  Pain Location: L knee Pain Descriptors / Indicators: Operative site guarding;Cramping;Aching Pain Intervention(s): Monitored during session;Premedicated before session;Repositioned;Ice applied;Limited activity within patient's tolerance    Home Living Family/patient expects to be discharged to:: Private residence Living Arrangements: Spouse/significant other Available Help at Discharge: Family;Available 24 hours/day Type of Home: House Home Access: Stairs to enter Entrance Stairs-Rails: None Entrance Stairs-Number of Steps: 2 Home Layout: One level Home Equipment: Walker - 2 wheels;Hand held shower head Additional Comments: Pt's wife can only provide min guard-supervision level assist.    Prior Function Level of Independence: Independent         Comments: has back pain issues and did not use AD     Hand Dominance   Dominant Hand: Right  Extremity/Trunk Assessment   Upper Extremity Assessment: Overall WFL for tasks assessed           Lower  Extremity Assessment: LLE deficits/detail;Overall WFL for tasks assessed   LLE Deficits / Details: weakness as expected s/p surgery  Cervical / Trunk Assessment: Normal  Communication   Communication: No difficulties  Cognition Arousal/Alertness: Awake/alert Behavior During Therapy: WFL for tasks assessed/performed Overall Cognitive Status: Within Functional Limits for tasks assessed                      General Comments General comments (skin integrity, edema, etc.): Pt is not allowing PT to flex his knee more than 50 deg with significant pain complaints, but also painful to fully extend.  Comfortable to move in immobilizer however    Exercises Total Joint Exercises Ankle Circles/Pumps: AROM;Left;5 reps Heel Slides: AAROM;Left;10 reps Straight Leg Raises: AAROM;Left;10 reps Knee Flexion: AAROM;Left;5 reps Goniometric ROM: -7, 50 AAROM      Assessment/Plan    PT Assessment Patient needs continued PT services  PT Diagnosis Difficulty walking;Acute pain   PT Problem List Decreased strength;Decreased range of motion;Decreased activity tolerance;Decreased balance;Decreased mobility;Decreased coordination;Decreased knowledge of use of DME;Decreased safety awareness;Cardiopulmonary status limiting activity;Obesity;Decreased skin integrity;Pain  PT Treatment Interventions DME instruction;Gait training;Stair training;Functional mobility training;Therapeutic activities;Therapeutic exercise;Balance training;Neuromuscular re-education;Patient/family education   PT Goals (Current goals can be found in the Care Plan section) Acute Rehab PT Goals Patient Stated Goal: to decrease pain PT Goal Formulation: With patient/family Time For Goal Achievement: 01/27/15 Potential to Achieve Goals: Good    Frequency 7X/week   Barriers to discharge Inaccessible home environment;Decreased caregiver support wife cannot physically assist him as needed now    Co-evaluation                End of Session Equipment Utilized During Treatment: Gait belt;Oxygen Activity Tolerance: Patient tolerated treatment well;Patient limited by pain;Treatment limited secondary to medical complications (Comment) (pulse 111 at rest and 120 with gait) Patient left: in bed;with call bell/phone within reach;with bed alarm set;with nursing/sitter in room;with family/visitor present Nurse Communication: Mobility status         Time: [id number] PT Time Calculation (min) (ACUTE ONLY): 59 min   Charges:   PT Evaluation $PT Eval Moderate Complexity: 1 Procedure PT Treatments $Gait Training: 8-22 mins $Therapeutic Exercise: 8-22 mins $Therapeutic Activity: 8-22 mins   PT G Codes:        Ramond Dial January 17, 2015, 2:19 PM   Mee Hives, PT MS Acute Rehab Dept. Number: [facility] [id number] and [place] [phone]

## 2015-01-13 NOTE — Progress Notes (Signed)
Subjective: 1 Day Post-Op Procedure(s) (LRB): LEFT TOTAL KNEE ARTHROPLASTY (Left) Patient reports pain as moderate.  No nausea/vomiting, lightheadedness/dizziness, cheat pain/sob.    Objective: Vital signs in last 24 hours: Temp:  [97.3 F (36.3 C)-98.3 F (36.8 C)] 98.3 F (36.8 C) (01/05 0517) Pulse Rate:  [93-117] 110 (01/05 0517) Resp:  [10-20] 18 (01/05 0517) BP: (118-156)/(67-93) 136/72 mmHg (01/05 0517) SpO2:  [95 %-100 %] 96 % (01/05 0517) Weight:  [130.636 kg (288 lb)] 130.636 kg (288 lb) (01/04 1115)  Intake/Output from previous day: 01/04 0701 - 01/05 0700 In: 2730 [P.O.:730; I.V.:2000] Out: 650 [Urine:650] Intake/Output this shift: Total I/O In: -  Out: 650 [Urine:650]  No results for input(s): HGB in the last 72 hours. No results for input(s): WBC, RBC, HCT, PLT in the last 72 hours. No results for input(s): NA, K, CL, CO2, BUN, CREATININE, GLUCOSE, CALCIUM in the last 72 hours. No results for input(s): LABPT, INR in the last 72 hours.  Neurologically intact Neurovascular intact Sensation intact distally Intact pulses distally Dorsiflexion/Plantar flexion intact Compartment soft Negative homans  Assessment/Plan: 1 Day Post-Op Procedure(s) (LRB): LEFT TOTAL KNEE ARTHROPLASTY (Left) Advance diet Up with therapy Discharge home with home health most likely tomorrow but depends on pain and insulin control as well as how well he does with PT WBAT LLE Discussed with patient that we cannot increase narcotics anymore than we already have Insulin not controlled with diabetic protocol.  I have spoken to pharmacy, d/c protocol and reordered patient's insulin pump Dry dressing change prn  Fannie Knee 01/13/2015, 6:43 AM

## 2015-01-14 LAB — BASIC METABOLIC PANEL
ANION GAP: 6 (ref 5–15)
BUN: 16 mg/dL (ref 6–20)
CO2: 25 mmol/L (ref 22–32)
Calcium: 8.3 mg/dL — ABNORMAL LOW (ref 8.9–10.3)
Chloride: 106 mmol/L (ref 101–111)
Creatinine, Ser: 1.14 mg/dL (ref 0.61–1.24)
GFR calc Af Amer: >60 mL/min (ref >60)
GFR calc non Af Amer: >60 mL/min (ref >60)
GLUCOSE: 207 mg/dL — AB (ref 65–99)
Potassium: 4.8 mmol/L (ref 3.5–5.1)
Sodium: 137 mmol/L (ref 135–145)

## 2015-01-14 LAB — GLUCOSE, CAPILLARY
GLUCOSE-CAPILLARY: 144 mg/dL — AB (ref 65–99)
GLUCOSE-CAPILLARY: 276 mg/dL — AB (ref 65–99)
GLUCOSE-CAPILLARY: 164 mg/dL — AB (ref 65–99)
Glucose-Capillary: 113 mg/dL — ABNORMAL HIGH (ref 65–99)
Glucose-Capillary: 186 mg/dL — ABNORMAL HIGH (ref 65–99)
Glucose-Capillary: 166 mg/dL — ABNORMAL HIGH (ref 65–99)

## 2015-01-14 LAB — CBC
HCT: 35.4 % — ABNORMAL LOW (ref 39.0–52.0)
Hemoglobin: 10.8 g/dL — ABNORMAL LOW (ref 13.0–17.0)
MCH: 27 pg (ref 26.0–34.0)
MCHC: 30.5 g/dL (ref 30.0–36.0)
MCV: 88.5 fL (ref 78.0–100.0)
PLATELETS: 245 10*3/uL (ref 150–400)
RBC: 4 MIL/uL — ABNORMAL LOW (ref 4.22–5.81)
RDW: 14.4 % (ref 11.5–15.5)
WBC: 12.6 10*3/uL — AB (ref 4.0–10.5)

## 2015-01-14 NOTE — Care Management Important Message (Signed)
Important Message  Patient Details  Name: David Mcdowell MRN: [id number] Date of Birth: [birth year]   Medicare Important Message Given:  Yes    Shanette Tamargo P Amalie Koran 01/14/2015, 2:32 PM

## 2015-01-14 NOTE — Progress Notes (Signed)
Physical Therapy Treatment Patient Details Name: David Mcdowell MRN: [id number] DOB: Sep 15, 1955 Today's Date: 01/14/2015    History of Present Illness 60 y.o. male s/p L TKA. PMH significant for DM type II, HTN, neuropathy, myositis, anxiety, GERD, abscess, and arthritis.    PT Comments    Patient is making slow progress with PT with ability to ambulate 70ft with seated rest break. Mod/max A +2 for bed mobility and transfers this session. If not d/c to SNF, pt will need HHPT with 24/7 supervision. Pt unable to attempts stair training thus far and needs to attempt when ready. Continue to progress as tolerated.  Follow Up Recommendations  SNF     Equipment Recommendations  None recommended by PT    Recommendations for Other Services Rehab consult     Precautions / Restrictions Precautions Precautions: Knee;Fall Precaution Booklet Issued: No Required Braces or Orthoses: Knee Immobilizer - Left Knee Immobilizer - Left: On when out of bed or walking Restrictions Weight Bearing Restrictions: Yes LLE Weight Bearing: Weight bearing as tolerated    Mobility  Bed Mobility Overal bed mobility: Needs Assistance;+2 for physical assistance;+ 2 for safety/equipment Bed Mobility: Sit to Supine     Supine to sit: Mod assist;HOB elevated Sit to supine: Max assist;+2 for physical assistance   General bed mobility comments: educated pt on use blanket for assistance with bringing L LE into bed; pt required assist with bringing  bilat LE into bed and positioning of trunk  Transfers Overall transfer level: Needs assistance Equipment used: Rolling walker (2 wheeled) Transfers: Sit to/from Stand Sit to Stand: Mod assist;+2 physical assistance         General transfer comment: from recliner X2 and BSC;assistance to power up into standing and achieve upright posture with vc for technique and hand placement  Ambulation/Gait Ambulation/Gait assistance: Min guard Ambulation Distance (Feet):  40 Feet (20X2 ) Assistive device: Rolling walker (2 wheeled) (L knee immobilizer) Gait Pattern/deviations: Step-to pattern;Antalgic;Trunk flexed;Decreased stance time - left;Decreased step length - right Gait velocity: reduced   General Gait Details: seated rest break neededvc for sequencing, upright posture, and encouragement to increae WB through L LE   Stairs            Wheelchair Mobility    Modified Rankin (Stroke Patients Only)       Balance Overall balance assessment: Needs assistance Sitting-balance support: Feet supported Sitting balance-Leahy Scale: Good     Standing balance support: No upper extremity supported Standing balance-Leahy Scale: Fair Standing balance comment: Able to stand at sink and wash hands without UE support                    Cognition Arousal/Alertness: Awake/alert Behavior During Therapy: WFL for tasks assessed/performed Overall Cognitive Status: Within Functional Limits for tasks assessed                      Exercises      General Comments General comments (skin integrity, edema, etc.): limited ROM of L knee due to pt c/o of pain when knee is touched      Pertinent Vitals/Pain Pain Assessment: 0-10 Pain Score: 8  Pain Location: L knee Pain Descriptors / Indicators: Sore Pain Intervention(s): Limited activity within patient's tolerance;Monitored during session;Patient requesting pain meds-RN notified;Repositioned    Home Living                      Prior Function  PT Goals (current goals can now be found in the care plan section) Acute Rehab PT Goals Patient Stated Goal: to decrease pain PT Goal Formulation: With patient/family Time For Goal Achievement: 01/27/15 Potential to Achieve Goals: Good    Frequency  7X/week    PT Plan Current plan remains appropriate    Co-evaluation   Reason for Co-Treatment: For patient/therapist safety PT goals addressed during session:  Mobility/safety with mobility OT goals addressed during session: ADL's and self-care     End of Session Equipment Utilized During Treatment: Gait belt;Oxygen Activity Tolerance: Patient limited by pain;Patient limited by fatigue (pulse 111 at rest and 120 with gait) Patient left: with call bell/phone within reach;with family/visitor present;in chair     Time: [id number] PT Time Calculation (min) (ACUTE ONLY): 30 min  Charges:  $Gait Training: 8-22 mins $Therapeutic Activity: 8-22 mins                    G Codes:      Salina April, PTA Pager: [phone]   01/14/2015, 3:46 PM

## 2015-01-14 NOTE — Progress Notes (Signed)
Subjective: 2 Days Post-Op Procedure(s) (LRB): LEFT TOTAL KNEE ARTHROPLASTY (Left) Patient reports pain as moderate.  No nausea/vomiting, lightheadedness/dizziness, chest pain/sob.  Positive flatus but no bm.  Tolerating diet.  Objective: Vital signs in last 24 hours: Temp:  [98 F (36.7 C)-99.6 F (37.6 C)] 98 F (36.7 C) (01/06 0500) Pulse Rate:  [108-127] 108 (01/06 0500) Resp:  [18-20] 20 (01/06 0500) BP: (127-139)/(58-68) 128/59 mmHg (01/06 0500) SpO2:  [92 %-96 %] 95 % (01/06 0500)  Intake/Output from previous day: 01/05 0701 - 01/06 0700 In: 2365 [P.O.:480; I.V.:1885] Out: 1650 [Urine:1650] Intake/Output this shift: Total I/O In: -  Out: 350 [Urine:350]   Recent Labs  01/13/15 0735  HGB 12.3*    Recent Labs  01/13/15 0735  WBC 11.1*  RBC 4.46  HCT 39.0  PLT 268    Recent Labs  01/13/15 0500  NA 136  K 4.9  CL 103  CO2 26  BUN 13  CREATININE 0.86  GLUCOSE 291*  CALCIUM 8.3*   No results for input(s): LABPT, INR in the last 72 hours.  Neurologically intact Neurovascular intact Sensation intact distally Intact pulses distally Dorsiflexion/Plantar flexion intact Incision: no drainage No cellulitis present Compartment soft  Assessment/Plan: 2 Days Post-Op Procedure(s) (LRB): LEFT TOTAL KNEE ARTHROPLASTY (Left) Advance diet Up with therapy Discharge home with home health today as long as he progresses with PT.  If needs another day of PT, we will keep him until tomorrow.  At this point, I do NOT think we need to consider SNF I have discussed at length hhpt vs SNF with patient.  He agrees that he is behind from PT standpoint due to insulin management, but that he will push things today to enable d/c home with hhpt. WBAT LLE ABLA-mild and stable Type 1 DM- much more under control having resumed insulin pump Dry dressing change prn  Fannie Knee 01/14/2015, 6:23 AM

## 2015-01-14 NOTE — Progress Notes (Signed)
Occupational Therapy Treatment Patient Details Name: David Mcdowell MRN: [id number] DOB: 1955/12/23 Today's Date: 01/14/2015    History of present illness 60 y.o. male s/p L TKA. PMH significant for DM type II, HTN, neuropathy, myositis, anxiety, GERD, abscess, and arthritis.   OT comments  Pt making slow progress toward OT goals; pt limited by pain and fatigue this session. Pt currently mod assist +2 for toilet transfers (mod +2 for sit to stand and min assist for functional mobility to bathroom) and min assist for grooming activity in standing. Continue to recommend [facility] with 24/7 supervision as it does not look like SNF is an option per PA-C note. Will continue to follow acutely.     Follow Up Recommendations  Home health OT;Supervision/Assistance - 24 hour    Equipment Recommendations  3 in 1 bedside comode    Recommendations for Other Services      Precautions / Restrictions Precautions Precautions: Knee;Fall Precaution Booklet Issued: No Required Braces or Orthoses: Knee Immobilizer - Left Knee Immobilizer - Left: On when out of bed or walking Restrictions Weight Bearing Restrictions: Yes LLE Weight Bearing: Weight bearing as tolerated       Mobility Bed Mobility Overal bed mobility: Needs Assistance;+2 for physical assistance;+ 2 for safety/equipment Bed Mobility: Sit to Supine     Supine to sit: Mod assist;HOB elevated Sit to supine: Max assist;+2 for physical assistance   General bed mobility comments: HOB elevated and use of bedrails; assist to elevate trunk and bring bilat LE to EOB; vc for sequencing and hand placement  Transfers Overall transfer level: Needs assistance Equipment used: Rolling walker (2 wheeled) Transfers: Sit to/from Stand Sit to Stand: Mod assist;+2 physical assistance         General transfer comment: Mod assist +2 for sit to stand from chair x 2, BSC x 1. VCs for hand placement and technique.    Balance Overall balance assessment:  Needs assistance Sitting-balance support: Feet supported Sitting balance-Leahy Scale: Good     Standing balance support: No upper extremity supported;During functional activity Standing balance-Leahy Scale: Fair Standing balance comment: Able to stand at sink and wash hands without UE support                   ADL Overall ADL's : Needs assistance/impaired     Grooming: Minimal assistance;Wash/dry hands;Standing                   Armed forces technical officer: Moderate assistance;+2 for physical assistance;Ambulation;BSC;RW (BSC over toilet)   Toileting- Clothing Manipulation and Hygiene: Maximal assistance;Sit to/from stand       Functional mobility during ADLs: Moderate assistance;+2 for physical assistance;Rolling walker General ADL Comments: Pt reports he is not getting the nutrition that he needs to participate in this much therapy. Requesting to speak with RN about diet and food that the hospital is providing; wants more protein (RN notified). Pt reports that he just feels weak, his R knee is buckling and that is limiting his mobility currenlty.      Vision                     Perception     Praxis      Cognition   Behavior During Therapy: [facility] for tasks assessed/performed Overall Cognitive Status: Within Functional Limits for tasks assessed                       Extremity/Trunk Assessment  Exercises     Shoulder Instructions       General Comments      Pertinent Vitals/ Pain       Pain Assessment: 0-10 Pain Score: 8  Pain Location: L knee Pain Descriptors / Indicators: Sore Pain Intervention(s): Limited activity within patient's tolerance;Monitored during session;Repositioned;Patient requesting pain meds-RN notified  Home Living                                          Prior Functioning/Environment              Frequency Min 2X/week     Progress Toward Goals  OT Goals(current goals can  now be found in the care plan section)  Progress towards OT goals: Progressing toward goals  Acute Rehab OT Goals Patient Stated Goal: to decrease pain OT Goal Formulation: With patient  Plan Discharge plan remains appropriate    Co-evaluation    PT/OT/SLP Co-Evaluation/Treatment: Yes Reason for Co-Treatment: For patient/therapist safety   OT goals addressed during session: ADL's and self-care      End of Session Equipment Utilized During Treatment: Gait belt;Rolling walker;Left knee immobilizer   Activity Tolerance Patient limited by pain;Patient limited by fatigue   Patient Left in bed;with call bell/phone within reach;with family/visitor present   Nurse Communication Other (comment) (pt wants to talk about diet; needs less carbs, more protein)        Time: [id number] OT Time Calculation (min): 30 min  Charges: OT General Charges $OT Visit: 1 Procedure OT Treatments $Self Care/Home Management : 8-22 mins  Binnie Kand M.S., OTR/L Pager: [phone]  01/14/2015, 3:02 PM

## 2015-01-14 NOTE — Progress Notes (Signed)
Physical Therapy Treatment Patient Details Name: David Mcdowell MRN: [id number] DOB: 24-May-1955 Today's Date: 01/14/2015    History of Present Illness 60 y.o. male s/p L TKA. PMH significant for DM type II, HTN, neuropathy, myositis, anxiety, GERD, abscess, and arthritis.    PT Comments    Pt with overall mobility level of mod/max for bed mobility and transfers requiring +2 for transfers. Pt ambulated 67ft with seated rest break min guard/min A. Pt limited in L knee ROM and c/o pain if knee is touched by PT.  Continue to progress as tolerated.  Follow Up Recommendations  SNF     Equipment Recommendations  None recommended by PT    Recommendations for Other Services Rehab consult     Precautions / Restrictions Precautions Precautions: Knee;Fall Precaution Booklet Issued: No Required Braces or Orthoses: Knee Immobilizer - Left Knee Immobilizer - Left: On when out of bed or walking Restrictions Weight Bearing Restrictions: Yes LLE Weight Bearing: Weight bearing as tolerated    Mobility  Bed Mobility Overal bed mobility: Needs Assistance;+2 for physical assistance;+ 2 for safety/equipment Bed Mobility: Supine to Sit     Supine to sit: Mod assist;HOB elevated     General bed mobility comments: HOB elevated and use of bedrails; assist to elevate trunk and bring bilat LE to EOB; vc for sequencing and hand placement  Transfers Overall transfer level: Needs assistance Equipment used: Rolling walker (2 wheeled) Transfers: Sit to/from Stand Sit to Stand: Mod assist;+2 physical assistance;Max assist         General transfer comment: from EOB and recliner; vc for hand placement and technique; mod/max a +2 for powering up into standing with vc for foot placement  Ambulation/Gait Ambulation/Gait assistance: Min guard;Min assist Ambulation Distance (Feet): 25 Feet (44ft, 48ft) Assistive device: Rolling walker (2 wheeled) (L knee immobilizer) Gait Pattern/deviations: Step-to  pattern;Decreased stance time - left;Decreased step length - right;Antalgic;Shuffle;Trunk flexed;Decreased dorsiflexion - left;Decreased weight shift to left Gait velocity: reduced   General Gait Details: vc for upright posture, postition of RW, and sequencing; pt required seated rest break with tendency to lean on RW with forearms    Stairs            Wheelchair Mobility    Modified Rankin (Stroke Patients Only)       Balance Overall balance assessment: Needs assistance Sitting-balance support: Feet supported Sitting balance-Leahy Scale: Good     Standing balance support: Bilateral upper extremity supported Standing balance-Leahy Scale: Fair                      Cognition Arousal/Alertness: Awake/alert Behavior During Therapy: WFL for tasks assessed/performed Overall Cognitive Status: Within Functional Limits for tasks assessed                      Exercises      General Comments General comments (skin integrity, edema, etc.): limited ROM of L knee due to pt c/o of pain when knee is touched      Pertinent Vitals/Pain Pain Assessment: 0-10 Pain Score: 8  Pain Location: L knee Pain Descriptors / Indicators: Sore Pain Intervention(s): Limited activity within patient's tolerance;Monitored during session;Patient requesting pain meds-RN notified    Home Living                      Prior Function            PT Goals (current goals can now be found in the  care plan section) Acute Rehab PT Goals Patient Stated Goal: to decrease pain PT Goal Formulation: With patient/family Time For Goal Achievement: 01/27/15 Potential to Achieve Goals: Good    Frequency  7X/week    PT Plan Current plan remains appropriate    Co-evaluation             End of Session Equipment Utilized During Treatment: Gait belt;Oxygen Activity Tolerance: Patient limited by pain;Patient limited by fatigue (pulse 111 at rest and 120 with gait) Patient left:  with call bell/phone within reach;with family/visitor present;in chair     Time: 1120-1205 PT Time Calculation (min) (ACUTE ONLY): 45 min  Charges:  $Gait Training: 23-37 mins $Therapeutic Activity: 8-22 mins                    G Codes:      Salina April, PTA Pager: [phone]   01/14/2015, 2:48 PM

## 2015-01-15 LAB — CBC
HCT: 36.9 % — ABNORMAL LOW (ref 39.0–52.0)
HEMOGLOBIN: 11.3 g/dL — AB (ref 13.0–17.0)
MCH: 27 pg (ref 26.0–34.0)
MCHC: 30.6 g/dL (ref 30.0–36.0)
MCV: 88.3 fL (ref 78.0–100.0)
Platelets: 241 10*3/uL (ref 150–400)
RBC: 4.18 MIL/uL — AB (ref 4.22–5.81)
RDW: 14.2 % (ref 11.5–15.5)
WBC: 10.8 10*3/uL — ABNORMAL HIGH (ref 4.0–10.5)

## 2015-01-15 LAB — BASIC METABOLIC PANEL
Anion gap: 6 (ref 5–15)
BUN: 11 mg/dL (ref 6–20)
CHLORIDE: 105 mmol/L (ref 101–111)
CO2: 26 mmol/L (ref 22–32)
CREATININE: 1.07 mg/dL (ref 0.61–1.24)
Calcium: 8.5 mg/dL — ABNORMAL LOW (ref 8.9–10.3)
GFR calc Af Amer: >60 mL/min (ref >60)
GFR calc non Af Amer: >60 mL/min (ref >60)
GLUCOSE: 143 mg/dL — AB (ref 65–99)
POTASSIUM: 4.1 mmol/L (ref 3.5–5.1)
Sodium: 137 mmol/L (ref 135–145)

## 2015-01-15 LAB — GLUCOSE, CAPILLARY
GLUCOSE-CAPILLARY: 123 mg/dL — AB (ref 65–99)
Glucose-Capillary: 128 mg/dL — ABNORMAL HIGH (ref 65–99)

## 2015-01-15 NOTE — Progress Notes (Signed)
Pt ready for DC.  Will review instructions with family when they get here.

## 2015-01-15 NOTE — Progress Notes (Signed)
Physical Therapy Treatment Patient Details Name: David Mcdowell MRN: [id number] DOB: 27-Feb-1955 Today's Date: 01/15/2015    History of Present Illness 60 y.o. male s/p L TKA. PMH significant for DM type II, HTN, neuropathy, myositis, anxiety, GERD, abscess, and arthritis.    PT Comments    Pt making progress today.  He reports frustration related to staff managing his insulin and his diet choices.  He reports he is ready to go home today and will have lots of help.  He reports he will get in the house with a WC using his motorcycle ramps.  He feels he is ready and safe for DC home.  He certainly is not mobilizing as well as we would like at this point but is adamant about going home.  Will try to see for another session today if possible depending on his DC plan/time.      Follow Up Recommendations  Home health PT;Supervision for mobility/OOB     Equipment Recommendations  None recommended by PT    Recommendations for Other Services       Precautions / Restrictions Precautions Precautions: Knee;Fall Precaution Booklet Issued: No Required Braces or Orthoses: Knee Immobilizer - Left Knee Immobilizer - Left: On when out of bed or walking Restrictions Weight Bearing Restrictions: Yes LLE Weight Bearing: Weight bearing as tolerated    Mobility  Bed Mobility Overal bed mobility: Needs Assistance Bed Mobility: Supine to Sit     Supine to sit: Min assist;HOB elevated     General bed mobility comments: assist with LLE. Pt used rails and trapeze  Transfers Overall transfer level: Needs assistance Equipment used: Rolling walker (2 wheeled) Transfers: Sit to/from Stand Sit to Stand: Mod assist         General transfer comment: From EOB. Pt reports he has a high bed at home.   Ambulation/Gait Ambulation/Gait assistance: Min guard Ambulation Distance (Feet): 50 Feet Assistive device: Rolling walker (2 wheeled) (L knee immobilizer) Gait Pattern/deviations: Step-to  pattern;Decreased stride length Gait velocity: reduced   General Gait Details: took 2 standing rest breaks for pain and fatigue.    Stairs            Wheelchair Mobility    Modified Rankin (Stroke Patients Only)       Balance     Sitting balance-Leahy Scale: Good                              Cognition Arousal/Alertness: Awake/alert Behavior During Therapy: WFL for tasks assessed/performed Overall Cognitive Status: Within Functional Limits for tasks assessed                      Exercises      General Comments General comments (skin integrity, edema, etc.): Pt had not had pain meds this AM and had significant pain therefore deferred ther-ex this AM.        Pertinent Vitals/Pain Pain Assessment: 0-10 Pain Score: 8  Pain Location: l knee Pain Intervention(s): Limited activity within patient's tolerance;Monitored during session;Patient requesting pain meds-RN notified    Home Living                      Prior Function            PT Goals (current goals can now be found in the care plan section) Acute Rehab PT Goals Patient Stated Goal: wants to go home today Progress towards PT  goals: Progressing toward goals    Frequency  7X/week    PT Plan Current plan remains appropriate    Co-evaluation             End of Session Equipment Utilized During Treatment: Gait belt;Left knee immobilizer Activity Tolerance: Patient limited by pain (pulse 111 at rest and 120 with gait) Patient left: with call bell/phone within reach;in chair;with nursing/sitter in room     Time: [id number] PT Time Calculation (min) (ACUTE ONLY): 29 min  Charges:  $Gait Training: 23-37 mins                    G Codes:      Melvern Banker Feb 12, 2015, 10:21 AM  Lavonia Dana, PT  [phone] 02/12/2015

## 2015-02-10 ENCOUNTER — Other Ambulatory Visit: Payer: Self-pay | Admitting: *Deleted

## 2015-02-10 MED ORDER — LISINOPRIL 40 MG PO TABS: 40.0000 mg | ORAL_TABLET | Freq: Every day | ORAL | Status: DC

## 2015-02-10 MED ORDER — METOPROLOL SUCCINATE ER 50 MG PO TB24: 50.0000 mg | ORAL_TABLET | Freq: Every day | ORAL | Status: DC

## 2015-02-10 MED ORDER — PRAVASTATIN SODIUM 80 MG PO TABS: 80.0000 mg | ORAL_TABLET | Freq: Every day | ORAL | Status: DC

## 2015-03-24 ENCOUNTER — Ambulatory Visit: Payer: Medicare Other | Admitting: Nutrition

## 2015-04-28 ENCOUNTER — Other Ambulatory Visit: Payer: Self-pay | Admitting: Orthopedic Surgery

## 2015-04-28 DIAGNOSIS — M545 Low back pain: Secondary | ICD-10-CM

## 2015-05-06 ENCOUNTER — Ambulatory Visit
Admission: RE | Admit: 2015-05-06 | Discharge: 2015-05-06 | Disposition: A | Discharge status: Home / Self Care | Payer: Medicare Other | Source: Ambulatory Visit | Attending: Orthopedic Surgery | Admitting: Orthopedic Surgery

## 2015-05-06 DIAGNOSIS — M545 Low back pain: Secondary | ICD-10-CM

## 2015-11-16 ENCOUNTER — Encounter: Payer: Self-pay | Admitting: Cardiology

## 2015-11-16 NOTE — Progress Notes (Signed)
Cardiology Office Note  Date: 11/17/2015   ID: David Mcdowell, DOB [birth year], MRN [id number]  PCP: Purvis Kilts, MD  Primary Cardiologist: Rozann Lesches, MD   Chief Complaint  Patient presents with  . Hypertension    History of Present Illness: David Mcdowell is a 60 y.o. male last seen in December 2016. He called to schedule a follow-up visit stating that his blood pressure has been trending up. He is having a lot of problems with chronic back pain, now followed in a pain management clinic, also continues to follow with orthopedist. He reports recording diastolics over [id number] at home. Blood pressure today is not as significantly high, but still not optimally controlled. He reports compliance with his medications which include Toprol-XL and lisinopril.  Stress testing from July of last year was low risk as outlined below. I reviewed his ECG today which shows sinus rhythm with PVC and nonspecific ST-T changes. He does not report any chest pain.  Past Medical History:  Diagnosis Date  . Abscess   . Anxiety   . Arthritis   . Essential hypertension, benign   . GERD (gastroesophageal reflux disease)   . History of chest pain    Negative ischemic workup over time  . Mixed hyperlipidemia   . Myositis   . Neuropathy ([place])   . Recurrent kidney stones   . Type 2 diabetes mellitus ([place])     Past Surgical History:  Procedure Laterality Date  . arthroscopies     both knees   . BACK SURGERY    . CARPAL TUNNEL RELEASE    . ELBOW SURGERY     both tendon repairs( both elbows)  . INCISION AND DRAINAGE ABSCESS Right 02/06/2014   Procedure: INCISION AND DRAINAGE ABSCESS right groin abcess debridement skin and subqutaneous tissue;  Surgeon: Excell Seltzer, MD;  Location: [facility];  Service: General;  Laterality: Right;  . TOTAL KNEE ARTHROPLASTY Left 01/12/2015   Procedure: LEFT TOTAL KNEE ARTHROPLASTY;  Surgeon: [name], MD;  Location: [facility];  Service: Orthopedics;   Laterality: Left;    Current Outpatient Prescriptions  Medication Sig Dispense Refill  . HYDROmorphone (DILAUDID) 2 MG tablet Take 2 mg by mouth every 6 (six) hours as needed for severe pain.    . bisacodyl (DULCOLAX) 5 MG EC tablet Take 1 tablet (5 mg total) by mouth daily as needed for moderate constipation. 30 tablet 0  . chlorthalidone (HYGROTON) 25 MG tablet Take 0.5 tablets (12.5 mg total) by mouth daily. 15 tablet 6  . HUMULIN R 500 UNIT/ML injection 2 Units by Continuous infusion (non-IV) route continuous. 3 different basal rates 3 different bolus rates Use U-100 when hospitalized    . Insulin Human (INSULIN PUMP) SOLN Inject into the skin daily. Insulin Concentrated (Humulin R-500) unit/ml    . lisinopril (PRINIVIL,ZESTRIL) 40 MG tablet Take 1 tablet (40 mg total) by mouth daily. 90 tablet 3  . metFORMIN (GLUCOPHAGE) 1000 MG tablet Take 1,000 mg by mouth 2 (two) times daily with a meal.     . metoprolol succinate (TOPROL-XL) 50 MG 24 hr tablet Take 1 tablet (50 mg total) by mouth daily. Take with or immediately following a meal. 90 tablet 3  . omeprazole (PRILOSEC) 20 MG capsule TAKE ONE CAPSULE BY MOUTH ONCE DAILY 90 capsule 0  . pravastatin (PRAVACHOL) 80 MG tablet Take 1 tablet (80 mg total) by mouth daily. 90 tablet 3   No current facility-administered medications for this visit.  Allergies:  Patient has no known allergies.   Social History: The patient  reports that he has never smoked. He has never used smokeless tobacco. He reports that he does not drink alcohol or use drugs.   ROS:  Please see the history of present illness. Otherwise, complete review of systems is positive for chronic back pain.  All other systems are reviewed and negative.   Physical Exam: VS:  BP (!) 154/80   Pulse 85   Ht 6' (1.829 m)   Wt 293 lb (132.9 kg)   SpO2 98%   BMI 39.74 kg/m , BMI Body mass index is 39.74 kg/m.  Wt Readings from Last 3 Encounters:  11/17/15 293 lb (132.9 kg)    01/12/15 288 lb (130.6 kg)  01/05/15 288 lb (130.6 kg)    Obese male, comfortable at rest.  HEENT: Conjunctiva and lids normal, oropharynx clear.  Neck: Supple, no elevated JVP or carotid bruits, no thyromegaly.  Lungs: Clear to auscultation, nonlabored breathing at rest.  Cardiac: Regular rate and rhythm, no S3 or significant systolic murmur, no pericardial rub.  Abdomen: Protuberant, nontender, bowel sounds present.  Extremities: No pitting edema, distal pulses 2+. Wearing brace on left wrist. Skin: Warm and dry. Musculoskeletal: No kyphosis. Neuropsychiatric: Alert 3, affect appropriate.  ECG: I personally reviewed the tracing from 06/11/2014 which showed normal sinus rhythm.  Recent Labwork: 12/31/2014: ALT 38; AST 35 01/15/2015: BUN 11; Creatinine, Ser 1.07; Hemoglobin 11.3; Platelets 241; Potassium 4.1; Sodium 137   Other Studies Reviewed Today:  Lexiscan Cardiolite 08/06/2014:  The study is normal.  This is a low risk study.  The left ventricular ejection fraction is normal (55-65%).  There was no ST segment deviation noted during stress.  Normal resting and stress perfusion with no ischemia or infarction EF 65%  Echocardiogram 03/30/2013: Study Conclusions  - Left ventricle: The cavity size was normal. Systolic function was normal. The estimated ejection fraction was in the range of 55% to 60%. Doppler parameters are consistent with abnormal left ventricular relaxation (grade 1 diastolic dysfunction). Mild to moderate left ventricular hypertrophy. - Aortic valve: Mildly thickened, mildly calcified leaflets. There was no stenosis. - Mitral valve: Calcified annulus. Mildly thickened leaflets . No significant regurgitation.  Assessment and Plan:  1. Essential hypertension with increasing blood pressure trend. This could be complicated by pain control. He reports compliance with his current medications including Toprol-XL and lisinopril. We  will start chlorthalidone 12.5 mg daily and can up titrate from there as needed. Check BMET in 2 weeks. Nurse blood pressure checked thereafter.  2. History of chest pain with negative ischemic workup, no significant recurrence. ECG reviewed today.  3. Obesity. Weight loss would also be beneficial.  4. Hyperlipidemia, continues on Pravachol with follow-up per Dr. Hilma Favors.  Current medicines were reviewed with the patient today.   Orders Placed This Encounter  Procedures  . Basic metabolic panel  . EKG 12-Lead    Disposition: Nurse visit in 3 weeks.  Signed, Satira Sark, MD, [facility] 11/17/2015 8:29 AM    [facility] at [place], [place], [place] [address] Phone: [phone]; Fax: [phone]

## 2015-11-17 ENCOUNTER — Encounter: Payer: Self-pay | Admitting: Cardiology

## 2015-11-17 ENCOUNTER — Ambulatory Visit (INDEPENDENT_AMBULATORY_CARE_PROVIDER_SITE_OTHER): Payer: Medicare Other | Admitting: Cardiology

## 2015-11-17 VITALS — BP 154/80 | HR 85 | Ht 72.0 in | Wt 293.0 lb

## 2015-11-17 DIAGNOSIS — E782 Mixed hyperlipidemia: Secondary | ICD-10-CM

## 2015-11-17 DIAGNOSIS — I10 Essential (primary) hypertension: Secondary | ICD-10-CM

## 2015-11-17 DIAGNOSIS — Z87898 Personal history of other specified conditions: Secondary | ICD-10-CM

## 2015-11-17 MED ORDER — CHLORTHALIDONE 25 MG PO TABS
12.5000 mg | ORAL_TABLET | Freq: Every day | ORAL | 6 refills | Status: DC
Start: 2015-11-17 — End: 2015-12-14

## 2015-11-17 NOTE — Patient Instructions (Addendum)
Medication Instructions:   Begin Chlorthalidone 12.5mg  daily.  Continue all other medications.    Labwork:  BMET - order given today.  Due in approximately 2 weeks.  Office will contact with results via phone or letter.    Testing/Procedures: none  Follow-Up: To be determined.    Any Other Special Instructions Will Be Listed Below (If Applicable). 3 -4 weeks for nurse blood pressure check in office.   If you need a refill on your cardiac medications before your next appointment, please call your pharmacy.

## 2015-12-13 NOTE — H&P (Signed)
  David Mcdowell is a 60 year old gentleman who comes to the office concerned about his left wrist. Symptoms in the past 3-4 weeks. He had no injury or trauma. Pain at the base of the thumb. Activity-related. No pain at rest. He has tried a wrist splint without much improvement. Interval medical history is otherwise unremarkable and unchanged upon review. Medical Patient Data Intake form noted, updated and placed in the chart.    OBJECTIVE: Exam of  the left wrist  shows a positive Finkelstein's with tenderness over the extensor tendon sheath of the thumb. He is neurovascularly intact distally. Nontender to snuffbox.  X-RAYS: None obtained.  ASSESSMENT: Left de Quervain's tenosynovitis.  PLAN: A thumb spica wrist splint. Topical Aspercreme and/or lidocaine ointment. Sterapred 5 mg 12-day Dosepak. Did discuss the merits of an injection and/or physical therapy. Wants to hold on that for now. Aggressive icing. Followup for ongoing problems or concerns.  Addendum: David Mcdowell has not had great success with conservative treatment.  He would like to proceed with surgical intervention of left wrist first dorsal compartment release.  Risks, benefits and possible complications reviewed. Rehab and recovery time discussed.  Paperwork completed.

## 2015-12-14 ENCOUNTER — Ambulatory Visit (INDEPENDENT_AMBULATORY_CARE_PROVIDER_SITE_OTHER): Payer: Medicare Other | Admitting: *Deleted

## 2015-12-14 DIAGNOSIS — I10 Essential (primary) hypertension: Secondary | ICD-10-CM | POA: Diagnosis not present

## 2015-12-14 MED ORDER — CHLORTHALIDONE 25 MG PO TABS: 12.5000 mg | ORAL_TABLET | Freq: Every day | ORAL | 3 refills | Status: DC

## 2015-12-14 NOTE — Progress Notes (Signed)
Pt here for f/u on BP starting chlorthalidone 12.5 mg daily. Hasn't had lab work done with recent surgery scheduled for next Thursday (left Dequervains with nerve block) [facility] requesting cardiac clearance from Dr. Domenic Polite. Will have labs done prior to surgery so that insurance will cover cost. BP today is 152/78 - HR 95. Says he has check BP at home running between 140s-150s/70s-80s.

## 2015-12-14 NOTE — Addendum Note (Signed)
Addended by: Julian Hy T on: 12/14/2015 10:02 AM   Modules accepted: Orders

## 2015-12-14 NOTE — Progress Notes (Signed)
Noted. It looks like his blood pressure trend is better in general. Need to follow-up on lab work, we can always increase chlorthalidone further if needed. These more recent blood pressures look better overall and he should be able to proceed with planned surgery as scheduled. Please forward to [employer] office.

## 2015-12-14 NOTE — Progress Notes (Signed)
Pt aware forwarded note to [facility] ortho

## 2015-12-19 ENCOUNTER — Encounter (HOSPITAL_BASED_OUTPATIENT_CLINIC_OR_DEPARTMENT_OTHER): Payer: Self-pay

## 2015-12-19 ENCOUNTER — Encounter (HOSPITAL_BASED_OUTPATIENT_CLINIC_OR_DEPARTMENT_OTHER): Payer: Self-pay | Admitting: *Deleted

## 2015-12-19 ENCOUNTER — Encounter (HOSPITAL_BASED_OUTPATIENT_CLINIC_OR_DEPARTMENT_OTHER)
Admission: RE | Admit: 2015-12-19 | Discharge: 2015-12-19 | Disposition: A | Discharge status: Home / Self Care | Payer: Medicare Other | Source: Ambulatory Visit | Attending: Orthopedic Surgery | Admitting: Orthopedic Surgery

## 2015-12-19 DIAGNOSIS — E669 Obesity, unspecified: Secondary | ICD-10-CM | POA: Diagnosis not present

## 2015-12-19 DIAGNOSIS — Z6839 Body mass index (BMI) 39.0-39.9, adult: Secondary | ICD-10-CM | POA: Diagnosis not present

## 2015-12-19 DIAGNOSIS — I10 Essential (primary) hypertension: Secondary | ICD-10-CM | POA: Diagnosis not present

## 2015-12-19 DIAGNOSIS — K219 Gastro-esophageal reflux disease without esophagitis: Secondary | ICD-10-CM | POA: Diagnosis not present

## 2015-12-19 DIAGNOSIS — M654 Radial styloid tenosynovitis [de Quervain]: Secondary | ICD-10-CM | POA: Diagnosis present

## 2015-12-19 DIAGNOSIS — Z794 Long term (current) use of insulin: Secondary | ICD-10-CM | POA: Diagnosis not present

## 2015-12-19 DIAGNOSIS — E119 Type 2 diabetes mellitus without complications: Secondary | ICD-10-CM | POA: Diagnosis not present

## 2015-12-19 DIAGNOSIS — M65331 Trigger finger, right middle finger: Secondary | ICD-10-CM | POA: Diagnosis not present

## 2015-12-19 LAB — BASIC METABOLIC PANEL
ANION GAP: 9 (ref 5–15)
BUN: 14 mg/dL (ref 6–20)
CHLORIDE: 102 mmol/L (ref 101–111)
CO2: 27 mmol/L (ref 22–32)
Calcium: 9.4 mg/dL (ref 8.9–10.3)
Creatinine, Ser: 1.04 mg/dL (ref 0.61–1.24)
GFR calc Af Amer: >60 mL/min (ref >60)
GLUCOSE: 337 mg/dL — AB (ref 65–99)
POTASSIUM: 4.5 mmol/L (ref 3.5–5.1)
Sodium: 138 mmol/L (ref 135–145)

## 2015-12-19 NOTE — Progress Notes (Signed)
   12/19/15 1257  OBSTRUCTIVE SLEEP APNEA  Have you ever been diagnosed with sleep apnea through a sleep study? No  Do you snore loudly (loud enough to be heard through closed doors)?  0  Do you often feel tired, fatigued, or sleepy during the daytime (such as falling asleep during driving or talking to someone)? 0  Has anyone observed you stop breathing during your sleep? 0  Do you have, or are you being treated for high blood pressure? 1  BMI more than 35 kg/m2? 1  Age > 50 (1-yes) 1  Neck circumference greater than:Male 16 inches or larger, Male 17inches or larger? 0  Male Gender (Yes=1) 1  Obstructive Sleep Apnea Score 4

## 2015-12-20 NOTE — Progress Notes (Signed)
High glucose reviewed by dr foster,  No further testing needed at this time

## 2015-12-22 ENCOUNTER — Encounter (HOSPITAL_BASED_OUTPATIENT_CLINIC_OR_DEPARTMENT_OTHER)
Admission: RE | Disposition: A | Discharge status: Home / Self Care | Payer: Self-pay | Source: Ambulatory Visit | Attending: Orthopedic Surgery

## 2015-12-22 ENCOUNTER — Ambulatory Visit (HOSPITAL_BASED_OUTPATIENT_CLINIC_OR_DEPARTMENT_OTHER): Payer: Medicare Other | Admitting: Anesthesiology

## 2015-12-22 ENCOUNTER — Encounter (HOSPITAL_BASED_OUTPATIENT_CLINIC_OR_DEPARTMENT_OTHER): Payer: Self-pay

## 2015-12-22 ENCOUNTER — Ambulatory Visit (HOSPITAL_BASED_OUTPATIENT_CLINIC_OR_DEPARTMENT_OTHER)
Admission: RE | Admit: 2015-12-22 | Discharge: 2015-12-22 | Disposition: A | Discharge status: Home / Self Care | Payer: Medicare Other | Source: Ambulatory Visit | Attending: Orthopedic Surgery | Admitting: Orthopedic Surgery

## 2015-12-22 DIAGNOSIS — K219 Gastro-esophageal reflux disease without esophagitis: Secondary | ICD-10-CM | POA: Diagnosis not present

## 2015-12-22 DIAGNOSIS — E119 Type 2 diabetes mellitus without complications: Secondary | ICD-10-CM | POA: Insufficient documentation

## 2015-12-22 DIAGNOSIS — I10 Essential (primary) hypertension: Secondary | ICD-10-CM | POA: Diagnosis not present

## 2015-12-22 DIAGNOSIS — M654 Radial styloid tenosynovitis [de Quervain]: Secondary | ICD-10-CM | POA: Insufficient documentation

## 2015-12-22 DIAGNOSIS — Z794 Long term (current) use of insulin: Secondary | ICD-10-CM | POA: Insufficient documentation

## 2015-12-22 DIAGNOSIS — M65331 Trigger finger, right middle finger: Secondary | ICD-10-CM | POA: Insufficient documentation

## 2015-12-22 DIAGNOSIS — E669 Obesity, unspecified: Secondary | ICD-10-CM | POA: Insufficient documentation

## 2015-12-22 DIAGNOSIS — Z6839 Body mass index (BMI) 39.0-39.9, adult: Secondary | ICD-10-CM | POA: Insufficient documentation

## 2015-12-22 HISTORY — PX: STERIOD INJECTION: SHX5046

## 2015-12-22 HISTORY — DX: Dorsalgia, unspecified: M54.9

## 2015-12-22 HISTORY — DX: Other chronic pain: G89.29

## 2015-12-22 HISTORY — PX: DORSAL COMPARTMENT RELEASE: SHX5039

## 2015-12-22 LAB — GLUCOSE, CAPILLARY
Glucose-Capillary: 133 mg/dL — ABNORMAL HIGH (ref 65–99)
Glucose-Capillary: 158 mg/dL — ABNORMAL HIGH (ref 65–99)

## 2015-12-22 SURGERY — RELEASE, FIRST DORSAL COMPARTMENT, HAND
Anesthesia: General | Site: Hand | Laterality: Right

## 2015-12-22 MED ORDER — HYDROMORPHONE HCL 1 MG/ML IJ SOLN: 0.2500 mg | INTRAMUSCULAR | Status: DC | PRN

## 2015-12-22 MED ORDER — OXYCODONE-ACETAMINOPHEN 5-325 MG PO TABS: 1.0000 | ORAL_TABLET | ORAL | Status: DC | PRN

## 2015-12-22 MED ORDER — BUPIVACAINE HCL (PF) 0.5 % IJ SOLN
INTRAMUSCULAR | Status: DC | PRN
  Administered 2015-12-22: 5 mL

## 2015-12-22 MED ORDER — PROPOFOL 500 MG/50ML IV EMUL
INTRAVENOUS | Status: AC
  Filled 2015-12-22: qty 50

## 2015-12-22 MED ORDER — METOCLOPRAMIDE HCL 5 MG PO TABS: 5.0000 mg | ORAL_TABLET | Freq: Three times a day (TID) | ORAL | Status: DC | PRN

## 2015-12-22 MED ORDER — LIDOCAINE 2% (20 MG/ML) 5 ML SYRINGE
INTRAMUSCULAR | Status: AC
  Filled 2015-12-22: qty 5

## 2015-12-22 MED ORDER — LACTATED RINGERS IV SOLN
INTRAVENOUS | Status: DC
  Administered 2015-12-22 (×2): via INTRAVENOUS

## 2015-12-22 MED ORDER — DEXAMETHASONE SODIUM PHOSPHATE 10 MG/ML IJ SOLN
INTRAMUSCULAR | Status: AC
  Filled 2015-12-22: qty 1

## 2015-12-22 MED ORDER — LIDOCAINE 2% (20 MG/ML) 5 ML SYRINGE
INTRAMUSCULAR | Status: DC | PRN
  Administered 2015-12-22: 100 mg via INTRAVENOUS

## 2015-12-22 MED ORDER — BUPIVACAINE HCL (PF) 0.5 % IJ SOLN
INTRAMUSCULAR | Status: DC | PRN
  Administered 2015-12-22: 1 mL

## 2015-12-22 MED ORDER — CEFAZOLIN SODIUM-DEXTROSE 2-4 GM/100ML-% IV SOLN
2.0000 g | INTRAVENOUS | Status: AC
  Administered 2015-12-22: 2 g via INTRAVENOUS

## 2015-12-22 MED ORDER — PROMETHAZINE HCL 25 MG/ML IJ SOLN
6.2500 mg | INTRAMUSCULAR | Status: DC | PRN
Start: 2015-12-22 — End: 2015-12-22

## 2015-12-22 MED ORDER — CHLORHEXIDINE GLUCONATE 4 % EX LIQD: 60.0000 mL | Freq: Once | CUTANEOUS | Status: DC

## 2015-12-22 MED ORDER — SCOPOLAMINE 1 MG/3DAYS TD PT72: 1.0000 | MEDICATED_PATCH | Freq: Once | TRANSDERMAL | Status: DC | PRN

## 2015-12-22 MED ORDER — HYDROMORPHONE HCL 1 MG/ML IJ SOLN: 0.5000 mg | INTRAMUSCULAR | Status: DC | PRN

## 2015-12-22 MED ORDER — PROPOFOL 10 MG/ML IV BOLUS
INTRAVENOUS | Status: DC | PRN
  Administered 2015-12-22: 200 mg via INTRAVENOUS

## 2015-12-22 MED ORDER — MIDAZOLAM HCL 2 MG/2ML IJ SOLN
1.0000 mg | INTRAMUSCULAR | Status: DC | PRN
  Administered 2015-12-22: 2 mg via INTRAVENOUS

## 2015-12-22 MED ORDER — ONDANSETRON HCL 4 MG PO TABS: 4.0000 mg | ORAL_TABLET | Freq: Three times a day (TID) | ORAL | 0 refills | Status: DC | PRN

## 2015-12-22 MED ORDER — BUPIVACAINE HCL (PF) 0.5 % IJ SOLN
INTRAMUSCULAR | Status: AC
  Filled 2015-12-22: qty 30

## 2015-12-22 MED ORDER — FENTANYL CITRATE (PF) 100 MCG/2ML IJ SOLN
50.0000 ug | INTRAMUSCULAR | Status: DC | PRN
Start: 2015-12-22 — End: 2015-12-22
  Administered 2015-12-22: 100 ug via INTRAVENOUS

## 2015-12-22 MED ORDER — ONDANSETRON HCL 4 MG/2ML IJ SOLN: 4.0000 mg | Freq: Four times a day (QID) | INTRAMUSCULAR | Status: DC | PRN

## 2015-12-22 MED ORDER — METHYLPREDNISOLONE ACETATE 40 MG/ML IJ SUSP
INTRAMUSCULAR | Status: DC | PRN
  Administered 2015-12-22: 1 mL

## 2015-12-22 MED ORDER — FENTANYL CITRATE (PF) 100 MCG/2ML IJ SOLN
INTRAMUSCULAR | Status: AC
  Filled 2015-12-22: qty 2

## 2015-12-22 MED ORDER — PHENYLEPHRINE HCL 10 MG/ML IJ SOLN
INTRAMUSCULAR | Status: DC | PRN
  Administered 2015-12-22: 80 ug via INTRAVENOUS

## 2015-12-22 MED ORDER — OXYCODONE-ACETAMINOPHEN 5-325 MG PO TABS
1.0000 | ORAL_TABLET | ORAL | 0 refills | Status: DC | PRN
Start: 2015-12-22 — End: 2016-07-12

## 2015-12-22 MED ORDER — ONDANSETRON HCL 4 MG/2ML IJ SOLN
INTRAMUSCULAR | Status: AC
  Filled 2015-12-22: qty 2

## 2015-12-22 MED ORDER — MIDAZOLAM HCL 2 MG/2ML IJ SOLN
INTRAMUSCULAR | Status: AC
  Filled 2015-12-22: qty 2

## 2015-12-22 MED ORDER — CEFAZOLIN SODIUM 1 G IJ SOLR
INTRAMUSCULAR | Status: AC
  Filled 2015-12-22: qty 20

## 2015-12-22 MED ORDER — METHYLPREDNISOLONE ACETATE 40 MG/ML IJ SUSP
INTRAMUSCULAR | Status: AC
  Filled 2015-12-22: qty 1

## 2015-12-22 MED ORDER — ONDANSETRON HCL 4 MG PO TABS: 4.0000 mg | ORAL_TABLET | Freq: Four times a day (QID) | ORAL | Status: DC | PRN

## 2015-12-22 MED ORDER — METOCLOPRAMIDE HCL 5 MG/ML IJ SOLN: 5.0000 mg | Freq: Three times a day (TID) | INTRAMUSCULAR | Status: DC | PRN

## 2015-12-22 MED ORDER — LACTATED RINGERS IV SOLN: INTRAVENOUS | Status: DC

## 2015-12-22 MED ORDER — ONDANSETRON HCL 4 MG/2ML IJ SOLN
INTRAMUSCULAR | Status: DC | PRN
  Administered 2015-12-22: 4 mg via INTRAVENOUS

## 2015-12-22 SURGICAL SUPPLY — 42 items
BANDAGE ACE 3X5.8 VEL STRL LF (GAUZE/BANDAGES/DRESSINGS) ×4 IMPLANT
BLADE SURG 15 STRL LF DISP TIS (BLADE) ×2 IMPLANT
BLADE SURG 15 STRL SS (BLADE) ×4
BNDG CMPR 9X4 STRL LF SNTH (GAUZE/BANDAGES/DRESSINGS)
BNDG COHESIVE 3X5 TAN STRL LF (GAUZE/BANDAGES/DRESSINGS) ×4 IMPLANT
BNDG ESMARK 4X9 LF (GAUZE/BANDAGES/DRESSINGS) IMPLANT
CORDS BIPOLAR (ELECTRODE) IMPLANT
COVER BACK TABLE 60X90IN (DRAPES) ×4 IMPLANT
COVER MAYO STAND STRL (DRAPES) ×4 IMPLANT
CUFF TOURNIQUET SINGLE 18IN (TOURNIQUET CUFF) ×2 IMPLANT
DRAPE EXTREMITY T 121X128X90 (DRAPE) ×4 IMPLANT
DRAPE SURG 17X23 STRL (DRAPES) ×4 IMPLANT
DURAPREP 26ML APPLICATOR (WOUND CARE) ×4 IMPLANT
GAUZE SPONGE 4X4 12PLY STRL (GAUZE/BANDAGES/DRESSINGS) ×4 IMPLANT
GAUZE XEROFORM 1X8 LF (GAUZE/BANDAGES/DRESSINGS) ×4 IMPLANT
GLOVE BIO SURGEON STRL SZ 6.5 (GLOVE) ×1 IMPLANT
GLOVE BIO SURGEONS STRL SZ 6.5 (GLOVE) ×1
GLOVE BIOGEL PI IND STRL 7.0 (GLOVE) ×3 IMPLANT
GLOVE BIOGEL PI INDICATOR 7.0 (GLOVE) ×4
GLOVE ECLIPSE 7.0 STRL STRAW (GLOVE) ×4 IMPLANT
GLOVE SURG ORTHO 8.0 STRL STRW (GLOVE) ×4 IMPLANT
GOWN STRL REUS W/ TWL LRG LVL3 (GOWN DISPOSABLE) ×2 IMPLANT
GOWN STRL REUS W/ TWL XL LVL3 (GOWN DISPOSABLE) ×4 IMPLANT
GOWN STRL REUS W/TWL LRG LVL3 (GOWN DISPOSABLE) ×4
GOWN STRL REUS W/TWL XL LVL3 (GOWN DISPOSABLE) ×8
NDL HYPO 25X1 1.5 SAFETY (NEEDLE) ×2 IMPLANT
NEEDLE HYPO 25X1 1.5 SAFETY (NEEDLE) ×8 IMPLANT
NS IRRIG 1000ML POUR BTL (IV SOLUTION) ×4 IMPLANT
PACK BASIN DAY SURGERY FS (CUSTOM PROCEDURE TRAY) ×4 IMPLANT
PAD CAST 3X4 CTTN HI CHSV (CAST SUPPLIES) ×4 IMPLANT
PADDING CAST ABS 3INX4YD NS (CAST SUPPLIES) ×4
PADDING CAST ABS COTTON 3X4 (CAST SUPPLIES) ×4 IMPLANT
PADDING CAST COTTON 3X4 STRL (CAST SUPPLIES) ×4
SPLINT PLASTER CAST XFAST 3X15 (CAST SUPPLIES) IMPLANT
SPLINT PLASTER XTRA FASTSET 3X (CAST SUPPLIES)
STOCKINETTE 4X48 STRL (DRAPES) ×4 IMPLANT
SUT ETHILON 3 0 PS 1 (SUTURE) ×4 IMPLANT
SYR 5ML LL (SYRINGE) ×3 IMPLANT
SYR BULB 3OZ (MISCELLANEOUS) ×4 IMPLANT
SYR CONTROL 10ML LL (SYRINGE) ×4 IMPLANT
TOWEL OR 17X24 6PK STRL BLUE (TOWEL DISPOSABLE) ×4 IMPLANT
UNDERPAD 30X30 (UNDERPADS AND DIAPERS) ×4 IMPLANT

## 2015-12-22 NOTE — Anesthesia Procedure Notes (Signed)
Procedure Name: LMA Insertion Date/Time: 12/22/2015 11:45 AM Performed by: Lieutenant Diego Pre-anesthesia Checklist: Patient identified, Emergency Drugs available, Suction available and Patient being monitored Patient Re-evaluated:Patient Re-evaluated prior to inductionOxygen Delivery Method: Circle system utilized Preoxygenation: Pre-oxygenation with 100% oxygen Intubation Type: IV induction Ventilation: Mask ventilation without difficulty LMA: LMA inserted LMA Size: 5.0 Number of attempts: 1 Airway Equipment and Method: Bite block Placement Confirmation: positive ETCO2 and breath sounds checked- equal and bilateral Tube secured with: Tape Dental Injury: Teeth and Oropharynx as per pre-operative assessment

## 2015-12-22 NOTE — Interval H&P Note (Signed)
History and Physical Interval Note:  12/22/2015 11:10 AM  David Mcdowell  has presented today for surgery, with the diagnosis of stenosing tenosynovitis left wrist  The various methods of treatment have been discussed with the patient and family. After consideration of risks, benefits and other options for treatment, the patient has consented to  Procedure(s) with comments: [place] (DEQUERVAIN), left wrist (Left) - block as a surgical intervention .  The patient's history has been reviewed, patient examined, no change in status, stable for surgery.  I have reviewed the patient's chart and labs.  Questions were answered to the patient's satisfaction.     [name]

## 2015-12-22 NOTE — Anesthesia Preprocedure Evaluation (Addendum)
Anesthesia Evaluation  Patient identified by MRN, date of birth, ID band Patient awake    Reviewed: Allergy & Precautions, NPO status , Patient's Chart, lab work & pertinent test results, reviewed documented beta blocker date and time   History of Anesthesia Complications Negative for: history of anesthetic complications  Airway Mallampati: III  TM Distance: >3 FB Neck ROM: Full    Dental  (+) Teeth Intact   Pulmonary pneumonia, resolved,    breath sounds clear to auscultation       Cardiovascular hypertension, Pt. on medications and Pt. on home beta blockers  Rhythm:Regular     Neuro/Psych PSYCHIATRIC DISORDERS Anxiety  Neuromuscular disease    GI/Hepatic Neg liver ROS, GERD  Medicated and Controlled,  Endo/Other  diabetes, Type 2, Insulin DependentMorbid obesity  Renal/GU negative Renal ROS     Musculoskeletal  (+) Arthritis ,   Abdominal   Peds  Hematology negative hematology ROS (+)   Anesthesia Other Findings   Reproductive/Obstetrics                            Anesthesia Physical  Anesthesia Plan  ASA: III  Anesthesia Plan: General   Post-op Pain Management:    Induction: Intravenous  Airway Management Planned: LMA and Oral ETT  Additional Equipment: None  Intra-op Plan:   Post-operative Plan: Extubation in OR  Informed Consent: I have reviewed the patients History and Physical, chart, labs and discussed the procedure including the risks, benefits and alternatives for the proposed anesthesia with the patient or authorized representative who has indicated his/her understanding and acceptance.   Dental advisory given  Plan Discussed with: CRNA, Surgeon and Anesthesiologist  Anesthesia Plan Comments:        Anesthesia Quick Evaluation

## 2015-12-22 NOTE — Discharge Instructions (Signed)
Do not remove splint.  Do not get splint wet.  May apply ice for up to 20 minutes at a time for pain and swelling.  Fu appt in one week.  Post Anesthesia Home Care Instructions  Activity: Get plenty of rest for the remainder of the day. A responsible adult should stay with you for 24 hours following the procedure.  For the next 24 hours, DO NOT: -Drive a car -Paediatric nurse -Drink alcoholic beverages -Take any medication unless instructed by your physician -Make any legal decisions or sign important papers.  Meals: Start with liquid foods such as gelatin or soup. Progress to regular foods as tolerated. Avoid greasy, spicy, heavy foods. If nausea and/or vomiting occur, drink only clear liquids until the nausea and/or vomiting subsides. Call your physician if vomiting continues.  Special Instructions/Symptoms: Your throat may feel dry or sore from the anesthesia or the breathing tube placed in your throat during surgery. If this causes discomfort, gargle with warm salt water. The discomfort should disappear within 24 hours.  If you had a scopolamine patch placed behind your ear for the management of post- operative nausea and/or vomiting:  1. The medication in the patch is effective for 72 hours, after which it should be removed.  Wrap patch in a tissue and discard in the trash. Wash hands thoroughly with soap and water. 2. You may remove the patch earlier than 72 hours if you experience unpleasant side effects which may include dry mouth, dizziness or visual disturbances. 3. Avoid touching the patch. Wash your hands with soap and water after contact with the patch.

## 2015-12-22 NOTE — Anesthesia Postprocedure Evaluation (Signed)
Anesthesia Post Note  Patient: David Mcdowell  Procedure(s) Performed: Procedure(s) (LRB): RELEASE DORSAL COMPARTMENT (DEQUERVAIN), left wrist (Left) STEROID INJECTION (Right)  Patient location during evaluation: PACU Anesthesia Type: General Level of consciousness: sedated Pain management: pain level controlled Vital Signs Assessment: post-procedure vital signs reviewed and stable Respiratory status: spontaneous breathing and respiratory function stable Cardiovascular status: stable Anesthetic complications: no    Last Vitals:  Vitals:   12/22/15 1230 12/22/15 1245  BP: 113/64 110/70  Pulse: 96 93  Resp: (!) 30 13  Temp:      Last Pain:  Vitals:   12/22/15 1245  TempSrc:   PainSc: 0-No pain                 Olina Melfi DANIEL

## 2015-12-22 NOTE — Transfer of Care (Signed)
Immediate Anesthesia Transfer of Care Note  Patient: David Mcdowell  Procedure(s) Performed: Procedure(s) with comments: RELEASE DORSAL COMPARTMENT (DEQUERVAIN), left wrist (Left) - block STEROID INJECTION (Right)  Patient Location: PACU  Anesthesia Type:General  Level of Consciousness: sedated  Airway & Oxygen Therapy: Patient Spontanous Breathing and Patient connected to face mask oxygen  Post-op Assessment: Report given to RN and Post -op Vital signs reviewed and stable  Post vital signs: Reviewed and stable  Last Vitals:  Vitals:   12/22/15 1110 12/22/15 1215  BP: (!) 143/78 126/61  Pulse: 96 (!) 108  Resp: 20   Temp: 36.7 C     Last Pain:  Vitals:   12/22/15 1110  TempSrc: Oral         Complications: No apparent anesthesia complications

## 2015-12-23 ENCOUNTER — Encounter (HOSPITAL_BASED_OUTPATIENT_CLINIC_OR_DEPARTMENT_OTHER): Payer: Self-pay | Admitting: Orthopedic Surgery

## 2015-12-23 ENCOUNTER — Telehealth: Payer: Self-pay | Admitting: Cardiology

## 2015-12-23 NOTE — Telephone Encounter (Signed)
Patient notified

## 2015-12-23 NOTE — Telephone Encounter (Signed)
Patient called to tell Dr Domenic Polite that labs are now in my Chart for him to review and get back with him on.

## 2015-12-23 NOTE — Telephone Encounter (Signed)
6 months

## 2015-12-23 NOTE — Telephone Encounter (Signed)
When did you want him back for follow up?

## 2015-12-23 NOTE — Telephone Encounter (Signed)
Noted.  Will put recall in Epic.

## 2015-12-23 NOTE — Telephone Encounter (Signed)
Yes, I have seen the recent BMET which shows normal renal function and potassium. Would continue with current treatment plan since his blood pressure trend had improved.

## 2015-12-26 NOTE — Op Note (Signed)
NAMERAYBON, DEDOMINICIS NO.:  [id number]  MEDICAL RECORD NO.:  [id number]  LOCATION:                                 FACILITY:  PHYSICIAN:  [name], M.D. DATE OF BIRTH:  [birth year]  DATE OF PROCEDURE:  12/22/2015 DATE OF DISCHARGE:  12/22/2015                              OPERATIVE REPORT   PREOPERATIVE DIAGNOSES: 1. Recalcitrant de Quervain's tenosynovitis, 1st dorsal compartment,     left wrist. 2. Triggering A1 pulley, right long finger.  POSTOPERATIVE DIAGNOSES: 1. Recalcitrant de Quervain's tenosynovitis, 1st dorsal compartment,     left wrist. 2. Triggering A1 pulley, right long finger.  PROCEDURE: 1. Release 1st dorsal compartment, left wrist for de Quervain's     tenosynovitis. 2. Inject A1 pulley, right hand, long finger.  SURGEON:  [name], M.D.  ASSISTANT:  Elmyra Ricks, PA.  ANESTHESIA:  General.  BLOOD LOSS:  Minimal.  SPECIMENS:  None.  CULTURES:  None.  COMPLICATION:  None.  DRESSINGS:  Soft compressive with bulky hand dressing and splint on the left.  DESCRIPTION OF PROCEDURE:  The patient was brought to the operating room and after adequate anesthesia had been obtained, the right hand approached and injected in the A1 pulley under sterile technique with Depo-Medrol and Marcaine.  Band-aid applied.  Attention turned to the opposite left side.  Tourniquet applied.  Prepped and draped in usual sterile fashion.  Exsanguinated with elevation of [place].  Tourniquet inflated to 250 mmHg.  Small transverse incision over the 1st dorsal compartment.  Superficial branch radial nerve identified, protected. The 1st compartment was released in its entirety from proximal to distal.  This was primarily 1 compartment holding all other tendons. Fair amount of fluid tenosynovitis.  After being sure the entire component was released, wound was irrigated.  Closed with nylon.  Margins were injected with Marcaine. Sterile  compressive dressing applied.  Bulky hand dressing and splint. Tourniquet and deflated and removed.  Anesthesia reversed.  Brought to the recovery room.  Tolerated the surgery well.  No complications.     [name], M.D.     DFM/MEDQ  D:  12/22/2015  T:  12/23/2015  Job:  [id number]

## 2016-02-25 ENCOUNTER — Other Ambulatory Visit: Payer: Self-pay | Admitting: Cardiology

## 2016-02-26 ENCOUNTER — Other Ambulatory Visit: Payer: Self-pay | Admitting: Cardiology

## 2016-03-21 ENCOUNTER — Other Ambulatory Visit: Payer: Self-pay | Admitting: Cardiology

## 2016-03-21 MED ORDER — METOPROLOL SUCCINATE ER 50 MG PO TB24: 50.0000 mg | ORAL_TABLET | Freq: Every day | ORAL | 3 refills | Status: DC

## 2016-03-21 NOTE — Telephone Encounter (Signed)
Refill :   metoprolol succinate (TOPROL-XL) 50 MG 24 hr tablet   Patient called and is requesting that refill be called to [facility].

## 2016-03-21 NOTE — Telephone Encounter (Signed)
Done

## 2016-05-16 ENCOUNTER — Other Ambulatory Visit: Payer: Self-pay | Admitting: Neurological Surgery

## 2016-05-16 DIAGNOSIS — M5416 Radiculopathy, lumbar region: Secondary | ICD-10-CM

## 2016-05-19 ENCOUNTER — Ambulatory Visit
Admission: RE | Admit: 2016-05-19 | Discharge: 2016-05-19 | Disposition: A | Discharge status: Home / Self Care | Payer: Medicare Other | Source: Ambulatory Visit | Attending: Neurological Surgery | Admitting: Neurological Surgery

## 2016-05-19 DIAGNOSIS — M5416 Radiculopathy, lumbar region: Secondary | ICD-10-CM

## 2016-05-23 ENCOUNTER — Other Ambulatory Visit: Payer: Self-pay | Admitting: Neurological Surgery

## 2016-06-29 ENCOUNTER — Other Ambulatory Visit: Payer: Self-pay | Admitting: Cardiology

## 2016-06-29 MED ORDER — LISINOPRIL 40 MG PO TABS: 40.0000 mg | ORAL_TABLET | Freq: Every day | ORAL | 0 refills | Status: DC

## 2016-06-29 NOTE — Telephone Encounter (Signed)
° ° ° °  1. Which medications need to be refilled? (please list name of each medication and dose if known)  lisinopril (PRINIVIL,ZESTRIL) 40 MG tablet    2. Which pharmacy/location (including street and city if local pharmacy) is medication to be sent to? [facility]   3. Do they need a 30 day or 90 day supply? 90  Patient has changed pharmacy --send all prescriptions to [facility]

## 2016-07-02 ENCOUNTER — Other Ambulatory Visit: Payer: Self-pay

## 2016-07-10 ENCOUNTER — Ambulatory Visit: Payer: Medicare Other | Admitting: Cardiology

## 2016-07-16 ENCOUNTER — Telehealth: Payer: Self-pay | Admitting: Vascular Surgery

## 2016-07-16 ENCOUNTER — Encounter: Payer: Self-pay | Admitting: Vascular Surgery

## 2016-07-16 ENCOUNTER — Encounter (HOSPITAL_COMMUNITY): Payer: Self-pay

## 2016-07-16 NOTE — Telephone Encounter (Signed)
Sched appt 07/17/16 at 4:15. Spoke to pt to arrive at 3:45.

## 2016-07-16 NOTE — Pre-Procedure Instructions (Signed)
David Mcdowell  07/16/2016      [facility] [address] [place] [address] Phone: [phone] Fax: [place], [place] [facility] [address] [place] [address] [place] [address] Phone: [phone] Fax: [phone]  [place], [place], [place] - [place] [address] Phone: [phone] Fax: [phone]    Your procedure is scheduled on July 18  Report to [place] at [phone].M.  Call this number if you have problems the morning of surgery:  [phone]   Remember:  Do not eat food or drink liquids after midnight.  Take these medicines the morning of surgery with A SIP OF WATER Metoprolol succinate (Toprol XL), omeprazole (Prilosec)  Stop taking aspirin as directed by your Dr.  Stop taking BC's, Goody's, Fish Oil, Ibuprofen, Advil, Motrin, Aleve, Vitamins    How to Manage Your Diabetes Before and After Surgery  Why is it important to control my blood sugar before and after surgery? . Improving blood sugar levels before and after surgery helps healing and can limit problems. . A way of improving blood sugar control is eating a healthy diet by: o  Eating less sugar and carbohydrates o  Increasing activity/exercise o  Talking with your doctor about reaching your blood sugar goals . High blood sugars (greater than 180 mg/dL) can raise your risk of infections and slow your recovery, so you will need to focus on controlling your diabetes during the weeks before surgery. . Make sure that the doctor who takes care of your diabetes knows about your planned surgery including the date and location.  How do I manage my blood sugar before surgery? . Check your blood sugar at least 4 times a day, starting 2 days before surgery, to make sure that the level is not too high or low. o Check your blood sugar the morning of your  surgery when you wake up and every 2 hours until you get to the Short Stay unit. . If your blood sugar is less than 70 mg/dL, you will need to treat for low blood sugar: o Do not take insulin. o Treat a low blood sugar (less than 70 mg/dL) with  cup of clear juice (cranberry or apple), 4 glucose tablets, OR glucose gel. o Recheck blood sugar in 15 minutes after treatment (to make sure it is greater than 70 mg/dL). If your blood sugar is not greater than 70 mg/dL on recheck, call [phone] for further instructions. . Report your blood sugar to the short stay nurse when you get to Short Stay.  . If you are admitted to the hospital after surgery: o Your blood sugar will be checked by the staff and you will probably be given insulin after surgery (instead of oral diabetes medicines) to make sure you have good blood sugar levels. o The goal for blood sugar control after surgery is 80-180 mg/dL.              WHAT DO I DO ABOUT MY DIABETES MEDICATION?   Marland Kitchen Do not take oral diabetes medicines (pills) the morning of surgery. Metformin (Glucophage)      Follow your Drs  instructions on insulin Pump  . The day of surgery, do not take other diabetes injectables, including Byetta (exenatide), Bydureon (exenatide ER), Victoza (liraglutide), or Trulicity (dulaglutide).  . If  your CBG is greater than 220 mg/dL, you may take  of your sliding scale (correction) dose of insulin.  Other Instructions:          Patient Signature:  Date:   Nurse Signature:  Date:   Reviewed and Endorsed by [facility] Patient Education Committee, August 2015  Do not wear jewelry, make-up or nail polish.  Do not wear lotions, powders, or perfumes, or deoderant.  Do not shave 48 hours prior to surgery.  Men may shave face and neck.  Do not bring valuables to the hospital.  [facility] is not responsible for any belongings or valuables.  Contacts, dentures or bridgework may not be worn into surgery.   Leave your suitcase in the car.  After surgery it may be brought to your room.  For patients admitted to the hospital, discharge time will be determined by your treatment team.  Patients discharged the day of surgery will not be allowed to drive home.    Special instructions:  [place] - Preparing for Surgery  Before surgery, you can play an important role.  Because skin is not sterile, your skin needs to be as free of germs as possible.  You can reduce the number of germs on you skin by washing with CHG (chlorahexidine gluconate) soap before surgery.  CHG is an antiseptic cleaner which kills germs and bonds with the skin to continue killing germs even after washing.  Please DO NOT use if you have an allergy to CHG or antibacterial soaps.  If your skin becomes reddened/irritated stop using the CHG and inform your nurse when you arrive at Short Stay.  Do not shave (including legs and underarms) for at least 48 hours prior to the first CHG shower.  You may shave your face.  Please follow these instructions carefully:   1.  Shower with CHG Soap the night before surgery and the                                morning of Surgery.  2.  If you choose to wash your hair, wash your hair first as usual with your       normal shampoo.  3.  After you shampoo, rinse your hair and body thoroughly to remove the                      Shampoo.  4.  Use CHG as you would any other liquid soap.  You can apply chg directly       to the skin and wash gently with scrungie or a clean washcloth.  5.  Apply the CHG Soap to your body ONLY FROM THE NECK DOWN.        Do not use on open wounds or open sores.  Avoid contact with your eyes,       ears, mouth and genitals (private parts).  Wash genitals (private parts)       with your normal soap.  6.  Wash thoroughly, paying special attention to the area where your surgery        will be performed.  7.  Thoroughly rinse your body with warm water from the neck down.  8.  DO  NOT shower/wash with your normal soap after using and rinsing off       the CHG Soap.  9.  Pat yourself dry with a clean towel.  10.  Wear clean pajamas.            11.  Place clean sheets on your bed the night of your first shower and do not        sleep with pets.  Day of Surgery  Do not apply any lotions/deoderants the morning of surgery.  Please wear clean clothes to the hospital/surgery center.     Please read over the following fact sheets that you were given. Pain Booklet, Coughing and Deep Breathing, MRSA Information and Surgical Site Infection Prevention

## 2016-07-16 NOTE — Telephone Encounter (Signed)
-----   Message from Denman George, RN sent at 07/02/2016  9:30 AM EDT ----- Regarding: needs office consult with Dr. Donnetta Hutching Please schedule new patient consult with Dr. Donnetta Hutching prior to ALIF on 07/25/16.  Please remind the pt. To bring copy of L-S spine film to appt.

## 2016-07-17 ENCOUNTER — Encounter: Payer: Self-pay | Admitting: Vascular Surgery

## 2016-07-17 ENCOUNTER — Ambulatory Visit (INDEPENDENT_AMBULATORY_CARE_PROVIDER_SITE_OTHER): Payer: Medicare Other | Admitting: Vascular Surgery

## 2016-07-17 ENCOUNTER — Encounter (HOSPITAL_COMMUNITY)
Admission: RE | Admit: 2016-07-17 | Discharge: 2016-07-17 | Disposition: A | Discharge status: Home / Self Care | Payer: Medicare Other | Source: Ambulatory Visit | Attending: Neurological Surgery | Admitting: Neurological Surgery

## 2016-07-17 ENCOUNTER — Encounter (HOSPITAL_COMMUNITY): Payer: Self-pay

## 2016-07-17 ENCOUNTER — Ambulatory Visit (HOSPITAL_COMMUNITY)
Admission: RE | Admit: 2016-07-17 | Discharge: 2016-07-17 | Disposition: A | Discharge status: Home / Self Care | Payer: Medicare Other | Source: Ambulatory Visit | Attending: Neurological Surgery | Admitting: Neurological Surgery

## 2016-07-17 VITALS — BP 116/72 | HR 89 | Temp 97.0°F | Resp 20 | Ht 72.0 in | Wt 294.0 lb

## 2016-07-17 DIAGNOSIS — M5126 Other intervertebral disc displacement, lumbar region: Secondary | ICD-10-CM

## 2016-07-17 DIAGNOSIS — M5136 Other intervertebral disc degeneration, lumbar region: Secondary | ICD-10-CM | POA: Diagnosis not present

## 2016-07-17 HISTORY — DX: Personal history of urinary calculi: Z87.442

## 2016-07-17 LAB — SURGICAL PCR SCREEN
MRSA, PCR: NEGATIVE
Staphylococcus aureus: NEGATIVE

## 2016-07-17 LAB — GLUCOSE, CAPILLARY: Glucose-Capillary: 257 mg/dL — ABNORMAL HIGH (ref 65–99)

## 2016-07-17 LAB — BASIC METABOLIC PANEL
ANION GAP: 7 (ref 5–15)
BUN: 15 mg/dL (ref 6–20)
CALCIUM: 9.2 mg/dL (ref 8.9–10.3)
CHLORIDE: 101 mmol/L (ref 101–111)
CO2: 27 mmol/L (ref 22–32)
Creatinine, Ser: 0.99 mg/dL (ref 0.61–1.24)
GFR calc non Af Amer: >60 mL/min (ref >60)
GLUCOSE: 248 mg/dL — AB (ref 65–99)
Potassium: 4.3 mmol/L (ref 3.5–5.1)
Sodium: 135 mmol/L (ref 135–145)

## 2016-07-17 LAB — CBC WITH DIFFERENTIAL/PLATELET
Basophils Absolute: 0.1 10*3/uL (ref 0.0–0.1)
Basophils Relative: 1 %
Eosinophils Absolute: 0.4 10*3/uL (ref 0.0–0.7)
Eosinophils Relative: 4 %
HEMATOCRIT: 46.7 % (ref 39.0–52.0)
HEMOGLOBIN: 14.8 g/dL (ref 13.0–17.0)
LYMPHS ABS: 3.4 10*3/uL (ref 0.7–4.0)
Lymphocytes Relative: 37 %
MCH: 28.8 pg (ref 26.0–34.0)
MCHC: 31.7 g/dL (ref 30.0–36.0)
MCV: 91 fL (ref 78.0–100.0)
MONO ABS: 0.7 10*3/uL (ref 0.1–1.0)
MONOS PCT: 8 %
NEUTROS ABS: 4.7 10*3/uL (ref 1.7–7.7)
NEUTROS PCT: 50 %
Platelets: 237 10*3/uL (ref 150–400)
RBC: 5.13 MIL/uL (ref 4.22–5.81)
RDW: 13.2 % (ref 11.5–15.5)
WBC: 9.3 10*3/uL (ref 4.0–10.5)

## 2016-07-17 LAB — PROTIME-INR
INR: 1.02
Prothrombin Time: 13.4 seconds (ref 11.4–15.2)

## 2016-07-17 LAB — TYPE AND SCREEN
ABO/RH(D): AB POS
Antibody Screen: NEGATIVE

## 2016-07-17 NOTE — Progress Notes (Signed)
Call to [facility] Diabetes coordinator, discussion with pt. & Nurse relative to handling of pump/insulin before & after surgery. Pt. Verbalizes understanding.

## 2016-07-17 NOTE — Progress Notes (Signed)
Pt. Has in use an Insulin Pump with Humulin R 500, he remarked several times during the PAT visit that his situation & care plan is unique specific to him & Dr. Chalmers Cater is the MD coordinating with him to manage the insulin infusion. Call to [facility] the diabetic Nurse Coordinator. Pt. Agrees to suspend his pump at 0530 the morning of surgery. Guided by his knowledge & Dr. Chalmers Cater. Pt. Is followed by Dr. Domenic Polite & will be seen in prep. for this surgery on 07/19/2016 & the EKG will be done at that visit & pt. Asked that we pick it up in [facility] after that visit. Pt. Also followed at [facility] for pain management. - Dr. Mirna Mires. Pt. Denies all chest concerns today.

## 2016-07-17 NOTE — Progress Notes (Signed)
V[facility] and Vein Specialist of [place]  Patient name: David Mcdowell MRN: [id number] DOB: 1955/05/10 Sex: male  REASON FOR CONSULT: Discuss anterior exposure for L4-5 disc surgery  HPI: David Mcdowell is a 61 y.o. male, who is seen today for discussion of anterior lumbar fusion with Dr. Sherley Bounds. He is here today with his wife. He has failed conservative treatment and is in a pain management program. Continues to have severe pain and recommended that he undergo fusion of L4-5. He has never had any intra-abdominal surgery. He does use an insulin pump for his diabetes. Did have a prior discectomy in the past and is now scheduled for fusion on 07/25/2016. No history of peripheral vascular occlusive disease or coronary disease  Past Medical History:  Diagnosis Date  . Abscess   . Anxiety   . Arthritis   . Chronic back pain   . Essential hypertension, benign   . GERD (gastroesophageal reflux disease)   . History of chest pain    Negative ischemic workup over time  . History of kidney stones    spont. passed, post cytoscope    . Mixed hyperlipidemia   . Myositis   . Neuropathy   . Recurrent kidney stones   . Type 2 diabetes mellitus (HCC)    type I, on insulin pump    Family History  Problem Relation Age of Onset  . Heart disease Father   . Diabetes Father   . Coronary artery disease Unknown     SOCIAL HISTORY: Social History   Social History  . Marital status: Married    Spouse name: N/A  . Number of children: N/A  . Years of education: N/A   Occupational History  . Not on file.   Social History Main Topics  . Smoking status: Never Smoker  . Smokeless tobacco: Never Used  . Alcohol use No  . Drug use: No  . Sexual activity: Yes    Partners: Female   Other Topics Concern  . Not on file   Social History Narrative  . No narrative on file    Allergies  Allergen Reactions  . Aspirin     All blood thinning meds. (OTC)  Are contraindicated leading up to eye appt. & after eye appts , for concern for bleeding.      Current Outpatient Prescriptions  Medication Sig Dispense Refill  . aspirin EC 81 MG tablet Take 81 mg by mouth daily.    . chlorthalidone (HYGROTON) 25 MG tablet Take 0.5 tablets (12.5 mg total) by mouth daily. 45 tablet 3  . HUMULIN R 500 UNIT/ML injection 2 Units by Continuous infusion (non-IV) route continuous. 3 different basal rates 3 different bolus rates Use U-100 when hospitalized    . HYDROmorphone (DILAUDID) 4 MG tablet Take 4 mg by mouth See admin instructions. 4 mg every morning, and 8 mg at bedtime    . ibuprofen (ADVIL,MOTRIN) 200 MG tablet Take 800 mg by mouth every 8 (eight) hours as needed for mild pain or moderate pain. On hold for surgery- since 07/14/2016    . Insulin Human (INSULIN PUMP) SOLN Inject into the skin daily. Insulin Concentrated (Humulin R-500) unit/ml    . lisinopril (PRINIVIL,ZESTRIL) 40 MG tablet Take 1 tablet (40 mg total) by mouth daily. 90 tablet 0  . metFORMIN (GLUCOPHAGE) 1000 MG tablet Take 1,000 mg by mouth 2 (two) times daily with a meal.     . metoprolol succinate (TOPROL-XL) 50 MG 24 hr  tablet Take 1 tablet (50 mg total) by mouth daily. Take with or immediately following a meal. 90 tablet 3  . omeprazole (PRILOSEC) 20 MG capsule TAKE ONE CAPSULE BY MOUTH ONCE DAILY (Patient taking differently: TAKE ONE CAPSULE BY MOUTH ONCE DAILY     takes in the a.m.) 90 capsule 0  . pravastatin (PRAVACHOL) 80 MG tablet TAKE 1 TABLET BY MOUTH  DAILY (Patient taking differently: TAKE 1 TABLET BY MOUTH  DAILY     takes in the a.m.) 90 tablet 1   No current facility-administered medications for this visit.     REVIEW OF SYSTEMS:  [X]  denotes positive finding, [ ]  denotes negative finding Cardiac  Comments:  Chest pain or chest pressure:    Shortness of breath upon exertion:    Short of breath when lying flat:    Irregular heart rhythm:        Vascular    Pain in calf,  thigh, or hip brought on by ambulation: x Hip pain   Pain in feet at night that wakes you up from your sleep:     Blood clot in your veins:    Leg swelling:  x       Pulmonary    Oxygen at home:    Productive cough:     Wheezing:         Neurologic    Sudden weakness in arms or legs:     Sudden numbness in arms or legs:     Sudden onset of difficulty speaking or slurred speech:    Temporary loss of vision in one eye:     Problems with dizziness:         Gastrointestinal    Blood in stool:     Vomited blood:         Genitourinary    Burning when urinating:     Blood in urine:        Psychiatric    Major depression:         Hematologic    Bleeding problems:    Problems with blood clotting too easily:        Skin    Rashes or ulcers:        Constitutional    Fever or chills:      PHYSICAL EXAM: Vitals:   07/17/16 1438  BP: 116/72  Pulse: 89  Resp: 20  Temp: (!) 97 F (36.1 C)  TempSrc: Oral  SpO2: 94%  Weight: 294 lb (133.4 kg)  Height: 6' (1.829 m)    GENERAL: The patient is a well-nourished male, in no acute distress. The vital signs are documented above. CARDIOVASCULAR: 2+ radial and 2+ dorsalis pedis pulses bilaterally PULMONARY: There is good air exchange  ABDOMEN: Soft and non-tender . Morbidly obese. No prior surgery: Incisions MUSCULOSKELETAL: There are no major deformities or cyanosis. NEUROLOGIC: No focal weakness or paresthesias are detected. SKIN: There are no ulcers or rashes noted. PSYCHIATRIC: The patient has a normal affect.    MEDICAL ISSUES: Had long discussion with the patient and his wife present regarding my role for exposure. Explained to Dr. Ronnald Ramp has suggested fusion for his pain and is suggested anterior approach. I explained the level of the transverse incision beginning just below the umbilicus the midline to the left. Explained mobilization of the rectus muscle and entry the rest peritoneal space. Explain mobilization of the  left ureter and also explained mobilization of arterial venous structures overlying the spine. Explain potential injury for  all these. Explain the most precarious of all this is a mobilization of the left iliac vein and control of the vein branches. He has reportedly had no prior abdominal surgery and no evidence of atherosclerotic change. He is morbidly obese and I explained that this would make anterior approach somewhat more difficult due to him weighing approximately 300 pounds. I do not feel this is prohibitive. We will proceed with surgery as planned on 07/25/2016   Rosetta Posner, MD [facility] Specialists [facility] Tel [phone] Pager [phone]

## 2016-07-17 NOTE — Progress Notes (Signed)
   07/17/16 1008  OBSTRUCTIVE SLEEP APNEA  Have you ever been diagnosed with sleep apnea through a sleep study? No  Do you snore loudly (loud enough to be heard through closed doors)?  0  Do you often feel tired, fatigued, or sleepy during the daytime (such as falling asleep during driving or talking to someone)? 0  Has anyone observed you stop breathing during your sleep? 0  Do you have, or are you being treated for high blood pressure? 1  BMI more than 35 kg/m2? 1  Age > 50 (1-yes) 1  Neck circumference greater than:Male 16 inches or larger, Male 17inches or larger? 1  Male Gender (Yes=1) 1  Obstructive Sleep Apnea Score 5  Score 5 or greater  Results sent to PCP

## 2016-07-17 NOTE — Pre-Procedure Instructions (Signed)
David Mcdowell  07/17/2016      [facility] [address], [place] [address] Phone: [phone] Fax: [place], [place] [facility] [address] [place] [address] [place] [address] Phone: [phone] Fax: [phone]  [place], [place], [place] - [place] [address] Phone: [phone] Fax: [phone]    Your procedure is scheduled on July 18  Report to [place] at [phone].M.  Call this number if you have problems the morning of surgery:  [phone]   Remember:  Do not eat food or drink liquids after midnight.  Take these medicines the morning of surgery with A SIP OF WATER Metoprolol succinate (Toprol XL), omeprazole (Prilosec), you may take Dilaudid if needed   Stop taking aspirin as directed by your Dr.  Stop taking BC's, Goody's, Fish Oil, Ibuprofen, Advil, Motrin, Aleve, Vitamins    How to Manage Your Diabetes Before and After Surgery  Why is it important to control my blood sugar before and after surgery? . Improving blood sugar levels before and after surgery helps healing and can limit problems. . A way of improving blood sugar control is eating a healthy diet by: o  Eating less sugar and carbohydrates o  Increasing activity/exercise o  Talking with your doctor about reaching your blood sugar goals . High blood sugars (greater than 180 mg/dL) can raise your risk of infections and slow your recovery, so you will need to focus on controlling your diabetes during the weeks before surgery. . Make sure that the doctor who takes care of your diabetes knows about your planned surgery including the date and location.  How do I manage my blood sugar before surgery? . Check your blood sugar at least 4 times a day, starting 2 days before surgery, to make sure that the level is not too high or low. o Check  your blood sugar the morning of your surgery when you wake up and every 2 hours until you get to the Short Stay unit. . If your blood sugar is less than 70 mg/dL, you will need to treat for low blood sugar: o Do not take insulin. o Treat a low blood sugar (less than 70 mg/dL) with  cup of clear juice (cranberry or apple), 4 glucose tablets, OR glucose gel. o Recheck blood sugar in 15 minutes after treatment (to make sure it is greater than 70 mg/dL). If your blood sugar is not greater than 70 mg/dL on recheck, call [phone] for further instructions. . Report your blood sugar to the short stay nurse when you get to Short Stay.  . If you are admitted to the hospital after surgery: o Your blood sugar will be checked by the staff and you will probably be given insulin after surgery (instead of oral diabetes medicines) to make sure you have good blood sugar levels. o The goal for blood sugar control after surgery is 80-180 mg/dL.              WHAT DO I DO ABOUT MY DIABETES MEDICATION?   Marland Kitchen Do not take oral diabetes medicines (pills) the morning of surgery. Metformin (Glucophage)      Follow your Drs  instructions on insulin Pump   . If your CBG is greater than 220 mg/dL, David Mcdowell will bolus according to the instructions  from Dr. Chalmers Cater.  Other Instructions:          Patient Signature:  Date:   Nurse Signature:  Date:   Reviewed and Endorsed by [facility] Patient Education Committee, August 2015  Do not wear jewelry, make-up or nail polish.  Do not wear lotions, powders, or perfumes, or deoderant.  Do not shave 48 hours prior to surgery.  Men may shave face and neck.  Do not bring valuables to the hospital.  [facility] is not responsible for any belongings or valuables.  Contacts, dentures or bridgework may not be worn into surgery.  Leave your suitcase in the car.  After surgery it may be brought to your room.  For patients admitted to the hospital, discharge  time will be determined by your treatment team.  Patients discharged the day of surgery will not be allowed to drive home.    Special instructions:  [place] - Preparing for Surgery  Before surgery, you can play an important role.  Because skin is not sterile, your skin needs to be as free of germs as possible.  You can reduce the number of germs on you skin by washing with CHG (chlorahexidine gluconate) soap before surgery.  CHG is an antiseptic cleaner which kills germs and bonds with the skin to continue killing germs even after washing.  Please DO NOT use if you have an allergy to CHG or antibacterial soaps.  If your skin becomes reddened/irritated stop using the CHG and inform your nurse when you arrive at Short Stay.  Do not shave (including legs and underarms) for at least 48 hours prior to the first CHG shower.  You may shave your face.  Please follow these instructions carefully:   1.  Shower with CHG Soap the night before surgery and the                                morning of Surgery.  2.  If you choose to wash your hair, wash your hair first as usual with your       normal shampoo.  3.  After you shampoo, rinse your hair and body thoroughly to remove the                      Shampoo.  4.  Use CHG as you would any other liquid soap.  You can apply chg directly       to the skin and wash gently with scrungie or a clean washcloth.  5.  Apply the CHG Soap to your body ONLY FROM THE NECK DOWN.        Do not use on open wounds or open sores.  Avoid contact with your eyes,       ears, mouth and genitals (private parts).  Wash genitals (private parts)       with your normal soap.  6.  Wash thoroughly, paying special attention to the area where your surgery        will be performed.  7.  Thoroughly rinse your body with warm water from the neck down.  8.  DO NOT shower/wash with your normal soap after using and rinsing off       the CHG Soap.  9.  Pat yourself dry with a clean towel.             10.  Wear clean pajamas.  11.  Place clean sheets on your bed the night of your first shower and do not        sleep with pets.  Day of Surgery  Do not apply any lotions/deoderants the morning of surgery.  Please wear clean clothes to the hospital/surgery center.     Please read over the following fact sheets that you were given. Pain Booklet, Coughing and Deep Breathing, MRSA Information and Surgical Site Infection Prevention

## 2016-07-18 NOTE — Progress Notes (Addendum)
Anesthesia Chart Review:  Pt is a 61 year old male scheduled for L4-5 anterior lumbar interbody fusion on 07/25/2016 with Sherley Bounds, M.D. and Curt Jews, M.D.  - PCP is [name], M.D. - Cardiologist is Rozann Lesches, MD. Pt has appt 07/19/16 for pre-op eval.   - Endocrinologist is Bindubal Balan who cleared pt for surgery at last office visit 06/21/16.   PMH includes: HTN, DM, hyperlipidemia, GERD. Never smoker. BMI 40. S/p L TKA 01/12/15. S/p I&D R groin abscess 02/06/14.   Medications include: ASA, chlorthalidone, Humulin R (on a pump), lisinopril, metformin, metoprolol, prilosec, pravastatin - Patient reports he will suspend use of insulin pump at 5:30 AM DOS.  Preoperative labs reviewed.  glucose 248. HbA1c 7.3 at Dr. Almetta Lovely office 06/21/16.    EKG 11/17/15: Sinus rhythm with occasional PVCs. Nonspecific ST changes.  Nuclear stress test 08/06/2014:   The study is normal.  This is a low risk study.  The left ventricular ejection fraction is normal (55-65%).  There was no ST segment deviation noted during stress.  Echo 03/30/2013:  - Left ventricle: The cavity size was normal. Systolic function was normal. The estimated ejection fraction was in the range of 55% to 60%. Doppler parameters are consistent with abnormal left ventricular relaxation (grade 1 diastolic dysfunction). Mild to moderate left ventricular hypertrophy. - Aortic valve: Mildly thickened, mildly calcified leaflets. There was no stenosis. - Mitral valve: Calcified annulus. Mildly thickened leaflets. No significant regurgitation.  Will revisit chart after pt sees Dr. Domenic Polite.   Willeen Cass, FNP-BC [facility] Short Stay Surgical Center/Anesthesiology Phone: [phone] 07/18/2016 1:38 PM   Addendum:   Pt saw Dr. Domenic Polite 07/19/16 and was cleared for surgery at low risk.   If no changes, I anticipate pt can proceed with surgery as scheduled.   Willeen Cass, FNP-BC [facility] Short Stay Surgical  Center/Anesthesiology Phone: [phone] 07/20/2016 9:14 AM

## 2016-07-18 NOTE — Progress Notes (Signed)
Cardiology Office Note  Date: 07/19/2016   ID: David Mcdowell, DOB [birth year], MRN [id number]  PCP: [name], MD  Primary Cardiologist: Rozann Lesches, MD   Chief Complaint  Patient presents with  . Preoperative evaluation    History of Present Illness: David Mcdowell is a 61 y.o. male last seen in November 2017. He is referred to the office by Dr. Sherley Bounds for preoperative cardiac evaluation prior to anticipated fusion of L4-L5 under general anesthesia scheduled July 18. He states that he has been limited by lower back pain, has not been exercising. He does report compliance with his medications, and indicates that he has had better glucose control and blood pressure control recently. He continues to follow with an endocrinologist and states that his most recent hemoglobin A1c was 7.3. He does not report any exertional chest pain or palpitations. He underwent ischemic testing within the last 2 years as outlined below.  I reviewed his current medications which are listed below. He reports better blood pressure control since being on chlorthalidone. I went over his most recent lab work including lipids. He has been tolerating Pravachol.  I personally reviewed his ECG today which showed sinus tachycardia with a single PVC.  Past Medical History:  Diagnosis Date  . Abscess   . Anxiety   . Arthritis   . Chronic back pain   . Essential hypertension, benign   . GERD (gastroesophageal reflux disease)   . History of chest pain    Negative ischemic workup over time  . History of kidney stones    spont. passed, post cytoscope    . Mixed hyperlipidemia   . Myositis   . Neuropathy   . Recurrent kidney stones   . Type 2 diabetes mellitus (HCC)    type I, on insulin pump    Past Surgical History:  Procedure Laterality Date  . arthroscopies     both knees   . BACK SURGERY    . CARPAL TUNNEL RELEASE    . DORSAL COMPARTMENT RELEASE Left 12/22/2015   Procedure: RELEASE  DORSAL COMPARTMENT (DEQUERVAIN), left wrist;  Surgeon: [name], MD;  Location: [facility];  Service: Orthopedics;  Laterality: Left;  block  . ELBOW SURGERY     both tendon repairs( both elbows)  . INCISION AND DRAINAGE ABSCESS Right 02/06/2014   Procedure: INCISION AND DRAINAGE ABSCESS right groin abcess debridement skin and subqutaneous tissue;  Surgeon: Excell Seltzer, MD;  Location: [facility];  Service: General;  Laterality: Right;  . STERIOD INJECTION Right 12/22/2015   Procedure: STEROID INJECTION;  Surgeon: [name], MD;  Location: [facility];  Service: Orthopedics;  Laterality: Right;  . TOTAL KNEE ARTHROPLASTY Left 01/12/2015   Procedure: LEFT TOTAL KNEE ARTHROPLASTY;  Surgeon: [name], MD;  Location: [place];  Service: Orthopedics;  Laterality: Left;    Current Outpatient Prescriptions  Medication Sig Dispense Refill  . aspirin EC 81 MG tablet Take 81 mg by mouth daily.    . chlorthalidone (HYGROTON) 25 MG tablet Take 0.5 tablets (12.5 mg total) by mouth daily. 45 tablet 3  . HUMULIN R 500 UNIT/ML injection 2 Units by Continuous infusion (non-IV) route continuous. 3 different basal rates 3 different bolus rates Use U-100 when hospitalized    . HYDROmorphone (DILAUDID) 4 MG tablet Take 4 mg by mouth See admin instructions. 4 mg every morning, and 8 mg at bedtime    . Insulin Human (INSULIN PUMP) SOLN  Inject into the skin daily. Insulin Concentrated (Humulin R-500) unit/ml    . lisinopril (PRINIVIL,ZESTRIL) 40 MG tablet Take 1 tablet (40 mg total) by mouth daily. 90 tablet 0  . metFORMIN (GLUCOPHAGE) 1000 MG tablet Take 1,000 mg by mouth 2 (two) times daily with a meal.     . metoprolol succinate (TOPROL-XL) 50 MG 24 hr tablet Take 1 tablet (50 mg total) by mouth daily. Take with or immediately following a meal. 90 tablet 3  . omeprazole (PRILOSEC) 20 MG capsule Take 1 capsule (20 mg total) by mouth daily. 90 capsule 3  . pravastatin  (PRAVACHOL) 80 MG tablet Take 1 tablet (80 mg total) by mouth daily. 90 tablet 3  . ibuprofen (ADVIL,MOTRIN) 200 MG tablet Take 800 mg by mouth every 8 (eight) hours as needed for mild pain or moderate pain. On hold for surgery- since 07/14/2016     No current facility-administered medications for this visit.    Allergies:  Aspirin   Social History: The patient  reports that he has never smoked. He has never used smokeless tobacco. He reports that he does not drink alcohol or use drugs.   ROS:  Please see the history of present illness. Otherwise, complete review of systems is positive for chronic back pain.  All other systems are reviewed and negative.   Physical Exam: VS:  BP 130/78   Pulse (!) 110   Ht 6' (1.829 m)   Wt 287 lb (130.2 kg)   SpO2 95%   BMI 38.92 kg/m , BMI Body mass index is 38.92 kg/m.  Wt Readings from Last 3 Encounters:  07/19/16 287 lb (130.2 kg)  07/17/16 294 lb (133.4 kg)  07/17/16 293 lb 6.9 oz (133.1 kg)    Obese male, comfortable at rest.  HEENT: Conjunctiva and lids normal, oropharynx clear.  Neck: Supple, no elevated JVP or carotid bruits, no thyromegaly.  Lungs: Clear to auscultation, nonlabored breathing at rest.  Cardiac: Regular rate and rhythm, no S3 or significant systolic murmur, no pericardial rub.  Abdomen: Protuberant, nontender, bowel sounds present.  Extremities: No pitting edema, distal pulses 2+. Wearing brace on left wrist. Skin: Warm and dry. Musculoskeletal: No kyphosis. Neuropsychiatric: Alert 3, affect appropriate.  ECG: I personally reviewed the tracing from 11/17/2015 which showed sinus rhythm with nonspecific ST-T changes and PVC.  Recent Labwork: 07/17/2016: BUN 15; Creatinine, Ser 0.99; Hemoglobin 14.8; Platelets 237; Potassium 4.3; Sodium 135 February 2018: AST 36, ALT 41, cholesterol 143, triglycerides 136, HDL 48, LDL 68  Other Studies Reviewed Today:  Lexiscan Cardiolite 08/06/2014:  The study is normal.  This  is a low risk study.  The left ventricular ejection fraction is normal (55-65%).  There was no ST segment deviation noted during stress.  Normal resting and stress perfusion with no ischemia or infarction EF 65%  Echocardiogram 03/30/2013: Study Conclusions  - Left ventricle: The cavity size was normal. Systolic function was normal. The estimated ejection fraction was in the range of 55% to 60%. Doppler parameters are consistent with abnormal left ventricular relaxation (grade 1 diastolic dysfunction). Mild to moderate left ventricular hypertrophy. - Aortic valve: Mildly thickened, mildly calcified leaflets. There was no stenosis. - Mitral valve: Calcified annulus. Mildly thickened leaflets. No significant regurgitation.  Assessment and Plan:  1. Preoperative evaluation prior to anticipated fusion of L4-L5 under general anesthesia next week. Ischemic testing within the last 2 years was low risk, ECG reviewed today. Blood pressure is reasonably well controlled on current medications. I anticipate  that he should be able to proceed with planned surgery at relatively low perioperative cardiac risk.  2. Morbid obesity. We did discuss ultimately trying to get back to an exercise plan once he recovers from back surgery with a goal of further weight loss.  3. Mixed hyperlipidemia, continues on Pravachol with recent LDL 68.  4. Type 2 diabetes mellitus, following with endocrinology and reportedly with recent hemoglobin A1c 7.3.  Current medicines were reviewed with the patient today.   Orders Placed This Encounter  Procedures  . EKG 12-Lead    Disposition: Follow-up in 6 months.  Signed, Satira Sark, MD, [facility] 07/19/2016 12:36 PM    [place] [facility] [address] [place], [place] [address] Phone: [phone]; Fax: [phone]

## 2016-07-19 ENCOUNTER — Ambulatory Visit (INDEPENDENT_AMBULATORY_CARE_PROVIDER_SITE_OTHER): Payer: Medicare Other | Admitting: Cardiology

## 2016-07-19 ENCOUNTER — Encounter: Payer: Self-pay | Admitting: Cardiology

## 2016-07-19 VITALS — BP 130/78 | HR 110 | Ht 72.0 in | Wt 287.0 lb

## 2016-07-19 DIAGNOSIS — E119 Type 2 diabetes mellitus without complications: Secondary | ICD-10-CM | POA: Diagnosis not present

## 2016-07-19 DIAGNOSIS — E782 Mixed hyperlipidemia: Secondary | ICD-10-CM

## 2016-07-19 DIAGNOSIS — Z01810 Encounter for preprocedural cardiovascular examination: Secondary | ICD-10-CM | POA: Diagnosis not present

## 2016-07-19 MED ORDER — OMEPRAZOLE 20 MG PO CPDR: 20.0000 mg | DELAYED_RELEASE_CAPSULE | Freq: Every day | ORAL | 3 refills | Status: DC

## 2016-07-19 MED ORDER — PRAVASTATIN SODIUM 80 MG PO TABS: 80.0000 mg | ORAL_TABLET | Freq: Every day | ORAL | 3 refills | Status: DC

## 2016-07-19 NOTE — Patient Instructions (Signed)
Dr Domenic Polite will complete your pre-op cardiology risk evaluation and electronically send to your physician.    No lab work or tests ordered today.   Thank you for choosing [facility] !

## 2016-07-24 MED ORDER — DEXTROSE 5 % IV SOLN
3.0000 g | INTRAVENOUS | Status: AC
  Administered 2016-07-25: 3 g via INTRAVENOUS
  Filled 2016-07-24: qty 3000

## 2016-07-25 ENCOUNTER — Inpatient Hospital Stay (HOSPITAL_COMMUNITY)
Admission: RE | Disposition: A | Discharge status: Home / Self Care | Payer: Self-pay | Source: Ambulatory Visit | Attending: Neurological Surgery

## 2016-07-25 ENCOUNTER — Inpatient Hospital Stay (HOSPITAL_COMMUNITY): Payer: Medicare Other

## 2016-07-25 ENCOUNTER — Inpatient Hospital Stay (HOSPITAL_COMMUNITY): Payer: Medicare Other | Admitting: Emergency Medicine

## 2016-07-25 ENCOUNTER — Inpatient Hospital Stay (HOSPITAL_COMMUNITY)
Admission: RE | Admit: 2016-07-25 | Discharge: 2016-07-26 | DRG: 460 | Disposition: A | Discharge status: Home / Self Care | Payer: Medicare Other | Source: Ambulatory Visit | Attending: Neurological Surgery | Admitting: Neurological Surgery

## 2016-07-25 ENCOUNTER — Encounter (HOSPITAL_COMMUNITY): Payer: Self-pay | Admitting: *Deleted

## 2016-07-25 DIAGNOSIS — Z87442 Personal history of urinary calculi: Secondary | ICD-10-CM

## 2016-07-25 DIAGNOSIS — E782 Mixed hyperlipidemia: Secondary | ICD-10-CM | POA: Diagnosis present

## 2016-07-25 DIAGNOSIS — F419 Anxiety disorder, unspecified: Secondary | ICD-10-CM | POA: Diagnosis present

## 2016-07-25 DIAGNOSIS — M48061 Spinal stenosis, lumbar region without neurogenic claudication: Secondary | ICD-10-CM | POA: Diagnosis present

## 2016-07-25 DIAGNOSIS — Z833 Family history of diabetes mellitus: Secondary | ICD-10-CM | POA: Diagnosis not present

## 2016-07-25 DIAGNOSIS — Z8249 Family history of ischemic heart disease and other diseases of the circulatory system: Secondary | ICD-10-CM | POA: Diagnosis not present

## 2016-07-25 DIAGNOSIS — Z6838 Body mass index (BMI) 38.0-38.9, adult: Secondary | ICD-10-CM | POA: Diagnosis not present

## 2016-07-25 DIAGNOSIS — Z981 Arthrodesis status: Secondary | ICD-10-CM

## 2016-07-25 DIAGNOSIS — Z79899 Other long term (current) drug therapy: Secondary | ICD-10-CM

## 2016-07-25 DIAGNOSIS — Z791 Long term (current) use of non-steroidal anti-inflammatories (NSAID): Secondary | ICD-10-CM | POA: Diagnosis not present

## 2016-07-25 DIAGNOSIS — Z7982 Long term (current) use of aspirin: Secondary | ICD-10-CM | POA: Diagnosis not present

## 2016-07-25 DIAGNOSIS — Z9641 Presence of insulin pump (external) (internal): Secondary | ICD-10-CM | POA: Diagnosis present

## 2016-07-25 DIAGNOSIS — I10 Essential (primary) hypertension: Secondary | ICD-10-CM | POA: Diagnosis present

## 2016-07-25 DIAGNOSIS — Z794 Long term (current) use of insulin: Secondary | ICD-10-CM | POA: Diagnosis not present

## 2016-07-25 DIAGNOSIS — M609 Myositis, unspecified: Secondary | ICD-10-CM | POA: Diagnosis present

## 2016-07-25 DIAGNOSIS — Z96652 Presence of left artificial knee joint: Secondary | ICD-10-CM | POA: Diagnosis present

## 2016-07-25 DIAGNOSIS — K219 Gastro-esophageal reflux disease without esophagitis: Secondary | ICD-10-CM | POA: Diagnosis present

## 2016-07-25 DIAGNOSIS — M5136 Other intervertebral disc degeneration, lumbar region: Principal | ICD-10-CM | POA: Diagnosis present

## 2016-07-25 DIAGNOSIS — M4316 Spondylolisthesis, lumbar region: Secondary | ICD-10-CM

## 2016-07-25 DIAGNOSIS — Z79891 Long term (current) use of opiate analgesic: Secondary | ICD-10-CM

## 2016-07-25 DIAGNOSIS — E109 Type 1 diabetes mellitus without complications: Secondary | ICD-10-CM | POA: Diagnosis present

## 2016-07-25 DIAGNOSIS — M4036 Flatback syndrome, lumbar region: Secondary | ICD-10-CM | POA: Diagnosis not present

## 2016-07-25 DIAGNOSIS — Z9889 Other specified postprocedural states: Secondary | ICD-10-CM

## 2016-07-25 DIAGNOSIS — M2578 Osteophyte, vertebrae: Secondary | ICD-10-CM | POA: Diagnosis present

## 2016-07-25 DIAGNOSIS — Z419 Encounter for procedure for purposes other than remedying health state, unspecified: Secondary | ICD-10-CM

## 2016-07-25 HISTORY — PX: ABDOMINAL EXPOSURE: SHX5708

## 2016-07-25 HISTORY — PX: ANTERIOR LUMBAR FUSION: SHX1170

## 2016-07-25 LAB — GLUCOSE, CAPILLARY
Glucose-Capillary: 95 mg/dL (ref 65–99)
Glucose-Capillary: 253 mg/dL — ABNORMAL HIGH (ref 65–99)
Glucose-Capillary: 352 mg/dL — ABNORMAL HIGH (ref 65–99)
Glucose-Capillary: 379 mg/dL — ABNORMAL HIGH (ref 65–99)

## 2016-07-25 SURGERY — ANTERIOR LUMBAR FUSION 1 LEVEL
Anesthesia: General | Site: Spine Lumbar

## 2016-07-25 MED ORDER — POTASSIUM CHLORIDE IN NACL 20-0.9 MEQ/L-% IV SOLN: INTRAVENOUS | Status: DC

## 2016-07-25 MED ORDER — CHLORHEXIDINE GLUCONATE CLOTH 2 % EX PADS: 6.0000 | MEDICATED_PAD | Freq: Once | CUTANEOUS | Status: DC

## 2016-07-25 MED ORDER — DEXTROSE 5 % IV SOLN: INTRAVENOUS | Status: DC | PRN

## 2016-07-25 MED ORDER — MORPHINE SULFATE (PF) 4 MG/ML IV SOLN: 2.0000 mg | INTRAVENOUS | Status: DC | PRN

## 2016-07-25 MED ORDER — FENTANYL CITRATE (PF) 250 MCG/5ML IJ SOLN
INTRAMUSCULAR | Status: AC
  Filled 2016-07-25: qty 5

## 2016-07-25 MED ORDER — ASPIRIN EC 81 MG PO TBEC
81.0000 mg | DELAYED_RELEASE_TABLET | Freq: Every day | ORAL | Status: DC
  Administered 2016-07-26: 81 mg via ORAL
  Filled 2016-07-25: qty 1

## 2016-07-25 MED ORDER — PROPOFOL 10 MG/ML IV BOLUS
INTRAVENOUS | Status: AC
  Filled 2016-07-25: qty 20

## 2016-07-25 MED ORDER — ROCURONIUM BROMIDE 100 MG/10ML IV SOLN
INTRAVENOUS | Status: DC | PRN
  Administered 2016-07-25: 50 mg via INTRAVENOUS

## 2016-07-25 MED ORDER — CELECOXIB 200 MG PO CAPS
200.0000 mg | ORAL_CAPSULE | Freq: Two times a day (BID) | ORAL | Status: DC
  Administered 2016-07-25 – 2016-07-26 (×3): 200 mg via ORAL
  Filled 2016-07-25 (×3): qty 1

## 2016-07-25 MED ORDER — MIDAZOLAM HCL 2 MG/2ML IJ SOLN
INTRAMUSCULAR | Status: AC
  Filled 2016-07-25: qty 2

## 2016-07-25 MED ORDER — SENNA 8.6 MG PO TABS
1.0000 | ORAL_TABLET | Freq: Two times a day (BID) | ORAL | Status: DC
  Administered 2016-07-25 – 2016-07-26 (×2): 8.6 mg via ORAL
  Filled 2016-07-25 (×2): qty 1

## 2016-07-25 MED ORDER — 0.9 % SODIUM CHLORIDE (POUR BTL) OPTIME
TOPICAL | Status: DC | PRN
  Administered 2016-07-25: 1000 mL

## 2016-07-25 MED ORDER — SODIUM CHLORIDE 0.9% FLUSH: 3.0000 mL | Freq: Two times a day (BID) | INTRAVENOUS | Status: DC

## 2016-07-25 MED ORDER — PROMETHAZINE HCL 25 MG/ML IJ SOLN: 6.2500 mg | INTRAMUSCULAR | Status: DC | PRN

## 2016-07-25 MED ORDER — ONDANSETRON HCL 4 MG/2ML IJ SOLN: 4.0000 mg | Freq: Four times a day (QID) | INTRAMUSCULAR | Status: DC | PRN

## 2016-07-25 MED ORDER — THROMBIN 20000 UNITS EX SOLR
CUTANEOUS | Status: DC | PRN
  Administered 2016-07-25: 10:00:00 via TOPICAL

## 2016-07-25 MED ORDER — CHLORTHALIDONE 25 MG PO TABS
12.5000 mg | ORAL_TABLET | Freq: Every day | ORAL | Status: DC
  Administered 2016-07-26: 12.5 mg via ORAL
  Filled 2016-07-25: qty 0.5

## 2016-07-25 MED ORDER — DEXAMETHASONE SODIUM PHOSPHATE 10 MG/ML IJ SOLN
INTRAMUSCULAR | Status: DC | PRN
  Administered 2016-07-25: 10 mg via INTRAVENOUS

## 2016-07-25 MED ORDER — VECURONIUM BROMIDE 10 MG IV SOLR
INTRAVENOUS | Status: DC | PRN
  Administered 2016-07-25 (×3): 2 mg via INTRAVENOUS
  Administered 2016-07-25 (×3): 1 mg via INTRAVENOUS

## 2016-07-25 MED ORDER — ACETAMINOPHEN 650 MG RE SUPP: 650.0000 mg | RECTAL | Status: DC | PRN

## 2016-07-25 MED ORDER — CEFAZOLIN SODIUM-DEXTROSE 2-4 GM/100ML-% IV SOLN
2.0000 g | Freq: Three times a day (TID) | INTRAVENOUS | Status: AC
  Administered 2016-07-25 – 2016-07-26 (×2): 2 g via INTRAVENOUS
  Filled 2016-07-25 (×2): qty 100

## 2016-07-25 MED ORDER — THROMBIN 20000 UNITS EX SOLR
CUTANEOUS | Status: AC
  Filled 2016-07-25: qty 20000

## 2016-07-25 MED ORDER — FENTANYL CITRATE (PF) 100 MCG/2ML IJ SOLN
INTRAMUSCULAR | Status: DC | PRN
  Administered 2016-07-25 (×5): 50 ug via INTRAVENOUS
  Administered 2016-07-25: 100 ug via INTRAVENOUS

## 2016-07-25 MED ORDER — VECURONIUM BROMIDE 10 MG IV SOLR
INTRAVENOUS | Status: AC
  Filled 2016-07-25: qty 20

## 2016-07-25 MED ORDER — PANTOPRAZOLE SODIUM 40 MG PO TBEC
40.0000 mg | DELAYED_RELEASE_TABLET | Freq: Every day | ORAL | Status: DC
  Administered 2016-07-26: 40 mg via ORAL
  Filled 2016-07-25: qty 1

## 2016-07-25 MED ORDER — HYDROMORPHONE HCL 2 MG PO TABS
4.0000 mg | ORAL_TABLET | ORAL | Status: DC | PRN
  Administered 2016-07-25 – 2016-07-26 (×5): 4 mg via ORAL
  Filled 2016-07-25 (×5): qty 2

## 2016-07-25 MED ORDER — PROPOFOL 10 MG/ML IV BOLUS
INTRAVENOUS | Status: DC | PRN
  Administered 2016-07-25: 200 mg via INTRAVENOUS

## 2016-07-25 MED ORDER — MENTHOL 3 MG MT LOZG: 1.0000 | LOZENGE | OROMUCOSAL | Status: DC | PRN

## 2016-07-25 MED ORDER — THROMBIN 5000 UNITS EX SOLR
CUTANEOUS | Status: AC
  Filled 2016-07-25: qty 5000

## 2016-07-25 MED ORDER — HYDROMORPHONE HCL 1 MG/ML IJ SOLN
0.2500 mg | INTRAMUSCULAR | Status: DC | PRN
  Administered 2016-07-25: 0.5 mg via INTRAVENOUS

## 2016-07-25 MED ORDER — SODIUM CHLORIDE 0.9 % IV SOLN: 250.0000 mL | INTRAVENOUS | Status: DC

## 2016-07-25 MED ORDER — DEXAMETHASONE SODIUM PHOSPHATE 10 MG/ML IJ SOLN
10.0000 mg | INTRAMUSCULAR | Status: DC
  Filled 2016-07-25: qty 1

## 2016-07-25 MED ORDER — CHLORHEXIDINE GLUCONATE 4 % EX LIQD: 60.0000 mL | Freq: Once | CUTANEOUS | Status: DC

## 2016-07-25 MED ORDER — SODIUM CHLORIDE 0.9% FLUSH: 3.0000 mL | INTRAVENOUS | Status: DC | PRN

## 2016-07-25 MED ORDER — DEXTROSE 5 % IV SOLN
500.0000 mg | Freq: Four times a day (QID) | INTRAVENOUS | Status: DC | PRN
  Filled 2016-07-25: qty 5

## 2016-07-25 MED ORDER — SODIUM CHLORIDE 0.9 % IR SOLN
Status: DC | PRN
  Administered 2016-07-25: 10:00:00

## 2016-07-25 MED ORDER — THROMBIN 5000 UNITS EX SOLR
OROMUCOSAL | Status: DC | PRN
  Administered 2016-07-25: 10:00:00 via TOPICAL

## 2016-07-25 MED ORDER — METFORMIN HCL 500 MG PO TABS
1000.0000 mg | ORAL_TABLET | Freq: Two times a day (BID) | ORAL | Status: DC
  Administered 2016-07-25 – 2016-07-26 (×2): 1000 mg via ORAL
  Filled 2016-07-25 (×2): qty 2

## 2016-07-25 MED ORDER — INSULIN ASPART 100 UNIT/ML ~~LOC~~ SOLN: 0.0000 [IU] | Freq: Three times a day (TID) | SUBCUTANEOUS | Status: DC

## 2016-07-25 MED ORDER — ONDANSETRON HCL 4 MG/2ML IJ SOLN
INTRAMUSCULAR | Status: DC | PRN
  Administered 2016-07-25: 4 mg via INTRAVENOUS

## 2016-07-25 MED ORDER — LISINOPRIL 20 MG PO TABS
40.0000 mg | ORAL_TABLET | Freq: Every day | ORAL | Status: DC
  Administered 2016-07-25 – 2016-07-26 (×2): 40 mg via ORAL
  Filled 2016-07-25 (×2): qty 2

## 2016-07-25 MED ORDER — MIDAZOLAM HCL 5 MG/5ML IJ SOLN
INTRAMUSCULAR | Status: DC | PRN
  Administered 2016-07-25: 2 mg via INTRAVENOUS

## 2016-07-25 MED ORDER — METHOCARBAMOL 500 MG PO TABS
500.0000 mg | ORAL_TABLET | Freq: Four times a day (QID) | ORAL | Status: DC | PRN
Start: 2016-07-25 — End: 2016-07-26
  Administered 2016-07-25 – 2016-07-26 (×3): 500 mg via ORAL
  Filled 2016-07-25 (×4): qty 1

## 2016-07-25 MED ORDER — ACETAMINOPHEN 325 MG PO TABS: 650.0000 mg | ORAL_TABLET | ORAL | Status: DC | PRN

## 2016-07-25 MED ORDER — SUGAMMADEX SODIUM 200 MG/2ML IV SOLN
INTRAVENOUS | Status: DC | PRN
  Administered 2016-07-25: 400 mg via INTRAVENOUS

## 2016-07-25 MED ORDER — LACTATED RINGERS IV SOLN
INTRAVENOUS | Status: DC | PRN
  Administered 2016-07-25 (×2): via INTRAVENOUS

## 2016-07-25 MED ORDER — PHENYLEPHRINE HCL 10 MG/ML IJ SOLN
INTRAMUSCULAR | Status: DC | PRN
  Administered 2016-07-25 (×2): 120 ug via INTRAVENOUS
  Administered 2016-07-25: 80 ug via INTRAVENOUS

## 2016-07-25 MED ORDER — HYDROMORPHONE HCL 1 MG/ML IJ SOLN
INTRAMUSCULAR | Status: AC
  Filled 2016-07-25: qty 0.5

## 2016-07-25 MED ORDER — SUCCINYLCHOLINE CHLORIDE 20 MG/ML IJ SOLN
INTRAMUSCULAR | Status: DC | PRN
  Administered 2016-07-25: 160 mg via INTRAVENOUS

## 2016-07-25 MED ORDER — METOPROLOL SUCCINATE ER 25 MG PO TB24
50.0000 mg | ORAL_TABLET | Freq: Every day | ORAL | Status: DC
  Administered 2016-07-26: 50 mg via ORAL
  Filled 2016-07-25: qty 2

## 2016-07-25 MED ORDER — PHENYLEPHRINE HCL 10 MG/ML IJ SOLN
INTRAMUSCULAR | Status: DC | PRN
  Administered 2016-07-25: 50 ug/min via INTRAVENOUS

## 2016-07-25 MED ORDER — LIDOCAINE HCL (CARDIAC) 20 MG/ML IV SOLN
INTRAVENOUS | Status: DC | PRN
  Administered 2016-07-25: 100 mg via INTRAVENOUS

## 2016-07-25 MED ORDER — PHENOL 1.4 % MT LIQD: 1.0000 | OROMUCOSAL | Status: DC | PRN

## 2016-07-25 MED ORDER — INSULIN PUMP
Freq: Three times a day (TID) | SUBCUTANEOUS | Status: DC
  Administered 2016-07-25: 15.5 via SUBCUTANEOUS
  Administered 2016-07-25 – 2016-07-26 (×3): via SUBCUTANEOUS
  Filled 2016-07-25: qty 1

## 2016-07-25 MED ORDER — ONDANSETRON HCL 4 MG PO TABS: 4.0000 mg | ORAL_TABLET | Freq: Four times a day (QID) | ORAL | Status: DC | PRN

## 2016-07-25 MED FILL — Heparin Sodium (Porcine) Inj 1000 Unit/ML: INTRAMUSCULAR | Qty: 30 | Status: AC

## 2016-07-25 MED FILL — Sodium Chloride IV Soln 0.9%: INTRAVENOUS | Qty: 1000 | Status: AC

## 2016-07-25 SURGICAL SUPPLY — 84 items
ADH SKN CLS APL DERMABOND .7 (GAUZE/BANDAGES/DRESSINGS) ×4
ALLOFUSE SELECT 5CC (Bone Implant) ×3 IMPLANT
ALLOFUSE SELECT CM 5CC (Bone Implant) ×1 IMPLANT
ANCH SPNL 27 LMBR MIS (Anchor) ×6 IMPLANT
ANCHOR LUMBAR 27 (Anchor) ×6 IMPLANT
ANCHOR LUMBAR 27MM (Anchor) ×3 IMPLANT
BAG DECANTER FOR FLEXI CONT (MISCELLANEOUS) ×4 IMPLANT
BUR EGG ELITE 5.0 (BURR) IMPLANT
BUR EGG ELITE 5.0MM (BURR)
BUR MATCHSTICK NEURO 3.0 LAGG (BURR) ×3 IMPLANT
CANISTER SUCT 3000ML PPV (MISCELLANEOUS) ×4 IMPLANT
CARTRIDGE OIL MAESTRO DRILL (MISCELLANEOUS) ×2 IMPLANT
CLIP LIGATING EXTRA MED SLVR (CLIP) ×4 IMPLANT
CLIP LIGATING EXTRA SM BLUE (MISCELLANEOUS) ×4 IMPLANT
DERMABOND ADVANCED (GAUZE/BANDAGES/DRESSINGS) ×4
DERMABOND ADVANCED .7 DNX12 (GAUZE/BANDAGES/DRESSINGS) ×2 IMPLANT
DIFFUSER DRILL AIR PNEUMATIC (MISCELLANEOUS) ×4 IMPLANT
DRAPE C-ARM 42X72 X-RAY (DRAPES) ×12 IMPLANT
DRAPE LAPAROTOMY 100X72X124 (DRAPES) ×4 IMPLANT
DRAPE POUCH INSTRU U-SHP 10X18 (DRAPES) ×4 IMPLANT
DURAPREP 26ML APPLICATOR (WOUND CARE) ×4 IMPLANT
ELECT BLADE 4.0 EZ CLEAN MEGAD (MISCELLANEOUS) ×8
ELECT REM PT RETURN 9FT ADLT (ELECTROSURGICAL) ×4
ELECTRODE BLDE 4.0 EZ CLN MEGD (MISCELLANEOUS) ×4 IMPLANT
ELECTRODE REM PT RTRN 9FT ADLT (ELECTROSURGICAL) ×2 IMPLANT
GAUZE SPONGE 4X4 12PLY STRL (GAUZE/BANDAGES/DRESSINGS) ×4 IMPLANT
GAUZE SPONGE 4X4 16PLY XRAY LF (GAUZE/BANDAGES/DRESSINGS) IMPLANT
GLOVE BIO SURGEON STRL SZ8 (GLOVE) ×8 IMPLANT
GLOVE BIOGEL PI IND STRL 7.0 (GLOVE) IMPLANT
GLOVE BIOGEL PI IND STRL 7.5 (GLOVE) IMPLANT
GLOVE BIOGEL PI IND STRL 8 (GLOVE) IMPLANT
GLOVE BIOGEL PI INDICATOR 7.0 (GLOVE) ×2
GLOVE BIOGEL PI INDICATOR 7.5 (GLOVE) ×2
GLOVE BIOGEL PI INDICATOR 8 (GLOVE) ×2
GLOVE SS BIOGEL STRL SZ 7.5 (GLOVE) ×2 IMPLANT
GLOVE SUPERSENSE BIOGEL SZ 7.5 (GLOVE) ×2
GOWN STRL REUS W/ TWL LRG LVL3 (GOWN DISPOSABLE) ×2 IMPLANT
GOWN STRL REUS W/ TWL XL LVL3 (GOWN DISPOSABLE) ×2 IMPLANT
GOWN STRL REUS W/TWL 2XL LVL3 (GOWN DISPOSABLE) ×1 IMPLANT
GOWN STRL REUS W/TWL LRG LVL3 (GOWN DISPOSABLE) ×20
GOWN STRL REUS W/TWL XL LVL3 (GOWN DISPOSABLE) ×8
HEMOSTAT POWDER KIT SURGIFOAM (HEMOSTASIS) ×2 IMPLANT
INSERT FOGARTY 61MM (MISCELLANEOUS) IMPLANT
INSERT FOGARTY SM (MISCELLANEOUS) IMPLANT
KIT BASIN OR (CUSTOM PROCEDURE TRAY) ×4 IMPLANT
KIT ROOM TURNOVER OR (KITS) ×4 IMPLANT
LOOP VESSEL MAXI BLUE (MISCELLANEOUS) IMPLANT
LOOP VESSEL MINI RED (MISCELLANEOUS) ×2 IMPLANT
NDL SPNL 18GX3.5 QUINCKE PK (NEEDLE) ×2 IMPLANT
NEEDLE SPNL 18GX3.5 QUINCKE PK (NEEDLE) ×4 IMPLANT
NS IRRIG 1000ML POUR BTL (IV SOLUTION) ×4 IMPLANT
OIL CARTRIDGE MAESTRO DRILL (MISCELLANEOUS) ×4
PACK LAMINECTOMY NEURO (CUSTOM PROCEDURE TRAY) ×4 IMPLANT
PAD ARMBOARD 7.5X6 YLW CONV (MISCELLANEOUS) ×8 IMPLANT
SPACER 29X39MM 8DEG 13MM INDE (Spacer) ×2 IMPLANT
SPONGE INTESTINAL PEANUT (DISPOSABLE) ×4 IMPLANT
SPONGE LAP 18X18 X RAY DECT (DISPOSABLE) ×6 IMPLANT
SPONGE LAP 4X18 X RAY DECT (DISPOSABLE) IMPLANT
STAPLER VISISTAT 35W (STAPLE) IMPLANT
SUT PROLENE 4 0 RB 1 (SUTURE)
SUT PROLENE 4-0 RB1 .5 CRCL 36 (SUTURE) IMPLANT
SUT PROLENE 5 0 C1 (SUTURE) ×3 IMPLANT
SUT PROLENE 5 0 CC1 (SUTURE) IMPLANT
SUT PROLENE 6 0 C 1 30 (SUTURE) ×4 IMPLANT
SUT PROLENE 6 0 CC (SUTURE) IMPLANT
SUT SILK 0 TIES 10X30 (SUTURE) ×4 IMPLANT
SUT SILK 2 0 TIES 10X30 (SUTURE) ×6 IMPLANT
SUT SILK 2 0SH CR/8 30 (SUTURE) IMPLANT
SUT SILK 3 0 TIES 10X30 (SUTURE) ×4 IMPLANT
SUT SILK 3 0SH CR/8 30 (SUTURE) IMPLANT
SUT VIC AB 0 CT1 18XCR BRD8 (SUTURE) ×1 IMPLANT
SUT VIC AB 0 CT1 27 (SUTURE)
SUT VIC AB 0 CT1 27XBRD ANBCTR (SUTURE) ×4 IMPLANT
SUT VIC AB 0 CT1 8-18 (SUTURE)
SUT VIC AB 0 CTX 36 (SUTURE) ×8
SUT VIC AB 0 CTX36XBRD ANTBCTR (SUTURE) IMPLANT
SUT VIC AB 2-0 CP2 18 (SUTURE) ×8 IMPLANT
SUT VIC AB 3-0 SH 8-18 (SUTURE) ×10 IMPLANT
SUT VICRYL 4-0 PS2 18IN ABS (SUTURE) ×4 IMPLANT
TISSUE ALLOFUSE SELECT 5CC (Bone Implant) IMPLANT
TOWEL GREEN STERILE (TOWEL DISPOSABLE) ×4 IMPLANT
TOWEL GREEN STERILE FF (TOWEL DISPOSABLE) ×12 IMPLANT
TRAY FOLEY W/METER SILVER 16FR (SET/KITS/TRAYS/PACK) ×4 IMPLANT
WATER STERILE IRR 1000ML POUR (IV SOLUTION) ×4 IMPLANT

## 2016-07-25 NOTE — Anesthesia Procedure Notes (Addendum)
Procedure Name: Intubation Date/Time: 07/25/2016 7:43 AM Performed by: Teressa Lower Pre-anesthesia Checklist: Patient identified, Emergency Drugs available, Suction available and Patient being monitored Patient Re-evaluated:Patient Re-evaluated prior to induction Oxygen Delivery Method: Circle system utilized Preoxygenation: Pre-oxygenation with 100% oxygen Induction Type: IV induction Ventilation: Mask ventilation without difficulty Laryngoscope Size: Glidescope and 4 Grade View: Grade I Tube type: Oral Number of attempts: 1 Airway Equipment and Method: Stylet Placement Confirmation: ETT inserted through vocal cords under direct vision,  positive ETCO2 and breath sounds checked- equal and bilateral Secured at: 23 cm Tube secured with: Tape Dental Injury: Teeth and Oropharynx as per pre-operative assessment  Comments: Performed by Prudence Davidson, SRNA

## 2016-07-25 NOTE — Op Note (Signed)
07/25/2016  12:25 PM  PATIENT:  David Mcdowell  61 y.o. male  PRE-OPERATIVE DIAGNOSIS:  Failed back syndrome, degenerative disc disease L4-5, foraminal stenosis L4-5, back and right leg pain  POST-OPERATIVE DIAGNOSIS:  same  PROCEDURE:  Anterior lumbar interbody fusion L4-5 utilizing a [place] peek interbody cage packed with morcellized allograft  SURGEON:  Sherley Bounds, MD Co-surgeon: Dr. Sherren Mocha early  ASSISTANTS: Meyran FNP  ANESTHESIA:   General  EBL: 150 ml  Total I/O In: 1000 [I.V.:1000] Out: 530 [Urine:380; Blood:150]  BLOOD ADMINISTERED: none  DRAINS: None  SPECIMEN:  none  INDICATION FOR PROCEDURE: This patient presented with severe back and right leg pain. Imaging showed previous laminectomy L4-5 L5-S1 with severe degenerative disc disease and retrolisthesis L4-5. The patient tried conservative measures without relief. Pain was debilitating. Recommended anterior lumbar interbody fusion L4-5. Patient understood the risks, benefits, and alternatives and potential outcomes and wished to proceed.  PROCEDURE DETAILS: The patient was taken to the operating room and after induction of adequate generalized endotracheal anesthesia he was placed in the supine position on the operating room table. The exposure was performed by Dr. Sherren Mocha early of vascular surgery and this will be dictated in a separate operative report. Once the disc space was exposed we performed AP and lateral fluoroscopy with a needle in the center of the disc space to confirm our level LIV-5. I marked the midline above and below on the vertebral body. I removed the needle. I incised the disc with a 15 blade scalpel and removed the disc en bloc with the pituitary rongeur. I then prepared the endplates for arthrodesis by using the high-speed drill and curved curettes. Meticulous plate preparation was performed. I undercut a large osteophyte off the inferior edge of L4 removing disc as well as a Marston along the  endplates. I decompressed the lateral gutters. I then used sequential trials. The 13 mm 8 trial fit the best. I further prepared the endplates for arthrodesis. We packed the appropriate cage with morcellized allograft and preloaded the spikes into the inserter. We chose 27 mm spikes. I gently tapped the graft into the interspace utilizing a lateral fluoroscopy. There was a stop on the inserter. I then inserted the spikes into the vertebral bodies above and below. One spike was inserted at L4 and to spikes and L5. This was then checked with lateral fluoroscopy. I then removed the inserter and checked my construct with AP and lateral fluoroscopy. I locked the spikes into place. I irrigated with saline solution. I removed the retractors and Saw no significant bleeding. We checked an AP abdominal x-ray. I looked and saw no retained sponges or instrument but we are awaiting formal radiographic interpretation by the radiologist. I then closed the rectus fascia with running Prolene sutures. I closed the subcutaneous tissue with 2-0 Vicryl and subcuticular tissue with 3-0 Vicryl. The skin was closed with Dermabond. The drapes were removed. The patient was awakened from general anesthesia and transferred to the recovery room in stable condition. At the end of the procedure all sponge needle and instrument counts were correct.   PLAN OF CARE: Admit to inpatient   PATIENT DISPOSITION:  PACU - hemodynamically stable.   Delay start of Pharmacological VTE agent (>24hrs) due to surgical blood loss or risk of bleeding:  yes

## 2016-07-25 NOTE — Anesthesia Preprocedure Evaluation (Signed)
Anesthesia Evaluation  Patient identified by MRN, date of birth, ID band Patient awake    Reviewed: Allergy & Precautions, NPO status , Patient's Chart, lab work & pertinent test results  Airway Mallampati: II  TM Distance: >3 FB Neck ROM: Full    Dental no notable dental hx.    Pulmonary neg pulmonary ROS,    Pulmonary exam normal breath sounds clear to auscultation       Cardiovascular hypertension, Normal cardiovascular exam Rhythm:Regular Rate:Normal     Neuro/Psych negative neurological ROS  negative psych ROS   GI/Hepatic negative GI ROS, Neg liver ROS,   Endo/Other  diabetes, Insulin DependentMorbid obesity  Renal/GU negative Renal ROS  negative genitourinary   Musculoskeletal negative musculoskeletal ROS (+)   Abdominal   Peds negative pediatric ROS (+)  Hematology negative hematology ROS (+)   Anesthesia Other Findings   Reproductive/Obstetrics negative OB ROS                             Anesthesia Physical Anesthesia Plan  ASA: III  Anesthesia Plan: General   Post-op Pain Management:    Induction: Intravenous  PONV Risk Score and Plan: 1 and Ondansetron and Dexamethasone  Airway Management Planned: Oral ETT  Additional Equipment:   Intra-op Plan:   Post-operative Plan: Extubation in OR  Informed Consent: I have reviewed the patients History and Physical, chart, labs and discussed the procedure including the risks, benefits and alternatives for the proposed anesthesia with the patient or authorized representative who has indicated his/her understanding and acceptance.   Dental advisory given  Plan Discussed with: CRNA and Surgeon  Anesthesia Plan Comments:         Anesthesia Quick Evaluation

## 2016-07-25 NOTE — H&P (Signed)
Subjective: Patient is a 61 y.o. male admitted for ALIF. Onset of symptoms was several months ago, gradually worsening since that time.  The pain is rated severe, unremitting, and is located at the across the lower back and radiates to R leg. The pain is described as aching and occurs all day. The symptoms have been progressive. Symptoms are exacerbated by exercise. MRI or CT showed DDD L4-5, previous LL L4-S1   Past Medical History:  Diagnosis Date  . Abscess   . Anxiety   . Arthritis   . Chronic back pain   . Essential hypertension, benign   . GERD (gastroesophageal reflux disease)   . History of chest pain    Negative ischemic workup over time  . History of kidney stones    spont. passed, post cytoscope    . Mixed hyperlipidemia   . Myositis   . Neuropathy   . Recurrent kidney stones   . Type 2 diabetes mellitus (HCC)    type I, on insulin pump    Past Surgical History:  Procedure Laterality Date  . arthroscopies     both knees   . BACK SURGERY    . CARPAL TUNNEL RELEASE    . DORSAL COMPARTMENT RELEASE Left 12/22/2015   Procedure: RELEASE DORSAL COMPARTMENT (DEQUERVAIN), left wrist;  Surgeon: [name], MD;  Location: [place];  Service: Orthopedics;  Laterality: Left;  block  . ELBOW SURGERY     both tendon repairs( both elbows)  . INCISION AND DRAINAGE ABSCESS Right 02/06/2014   Procedure: INCISION AND DRAINAGE ABSCESS right groin abcess debridement skin and subqutaneous tissue;  Surgeon: Excell Seltzer, MD;  Location: [facility];  Service: General;  Laterality: Right;  . STERIOD INJECTION Right 12/22/2015   Procedure: STEROID INJECTION;  Surgeon: [name], MD;  Location: [place];  Service: Orthopedics;  Laterality: Right;  . TOTAL KNEE ARTHROPLASTY Left 01/12/2015   Procedure: LEFT TOTAL KNEE ARTHROPLASTY;  Surgeon: [name], MD;  Location: [facility];  Service: Orthopedics;  Laterality: Left;    Prior to Admission medications    Medication Sig Start Date End Date Taking? Authorizing Provider  aspirin EC 81 MG tablet Take 81 mg by mouth daily.   Yes [provider]  chlorthalidone (HYGROTON) 25 MG tablet Take 0.5 tablets (12.5 mg total) by mouth daily. 12/14/15  Yes Satira Sark, MD  HUMULIN R 500 UNIT/ML injection 2 Units by Continuous infusion (non-IV) route continuous. 3 different basal rates 3 different bolus rates Use U-100 when hospitalized 11/09/14  Yes [provider]  HYDROmorphone (DILAUDID) 4 MG tablet Take 4 mg by mouth See admin instructions. 4 mg every morning, and 8 mg at bedtime   Yes [provider]  ibuprofen (ADVIL,MOTRIN) 200 MG tablet Take 800 mg by mouth every 8 (eight) hours as needed for mild pain or moderate pain. On hold for surgery- since 07/14/2016   Yes [provider]  Insulin Human (INSULIN PUMP) SOLN Inject into the skin daily. Insulin Concentrated (Humulin R-500) unit/ml   Yes [provider]  lisinopril (PRINIVIL,ZESTRIL) 40 MG tablet Take 1 tablet (40 mg total) by mouth daily. 06/29/16  Yes Satira Sark, MD  metFORMIN (GLUCOPHAGE) 1000 MG tablet Take 1,000 mg by mouth 2 (two) times daily with a meal.    Yes [provider]  metoprolol succinate (TOPROL-XL) 50 MG 24 hr tablet Take 1 tablet (50 mg total) by mouth daily. Take with or immediately following  a meal. 03/21/16  Yes Satira Sark, MD  omeprazole (PRILOSEC) 20 MG capsule Take 1 capsule (20 mg total) by mouth daily. 07/19/16  Yes Satira Sark, MD  pravastatin (PRAVACHOL) 80 MG tablet Take 1 tablet (80 mg total) by mouth daily. 07/19/16  Yes Satira Sark, MD   No Active Allergies  Social History  Substance Use Topics  . Smoking status: Never Smoker  . Smokeless tobacco: Never Used  . Alcohol use No    Family History  Problem Relation Age of Onset  . Heart disease Father   . Diabetes Father   . Coronary artery disease Unknown      Review of  Systems  Positive ROS: neg  All other systems have been reviewed and were otherwise negative with the exception of those mentioned in the HPI and as above.  Objective: Vital signs in last 24 hours: Temp:  [98.1 F (36.7 C)] 98.1 F (36.7 C) (07/18 0652) Pulse Rate:  [91] 91 (07/18 0652) Resp:  [18] 18 (07/18 0652) BP: (132)/(76) 132/76 (07/18 0652) SpO2:  [99 %] 99 % (07/18 0652) Weight:  [130.2 kg (287 lb)] 130.2 kg (287 lb) (07/18 7619)  General Appearance: Alert, cooperative, no distress, appears stated age Head: Normocephalic, without obvious abnormality, atraumatic Eyes: PERRL, conjunctiva/corneas clear, EOM's intact    Neck: Supple, symmetrical, trachea midline Back: Symmetric, no curvature, ROM normal, no CVA tenderness Lungs:  respirations unlabored Heart: Regular rate and rhythm Abdomen: Soft, non-tender Extremities: Extremities normal, atraumatic, no cyanosis or edema Pulses: 2+ and symmetric all extremities Skin: Skin color, texture, turgor normal, no rashes or lesions  NEUROLOGIC:   Mental status: Alert and oriented x4,  no aphasia, good attention span, fund of knowledge, and memory Motor Exam - grossly normal Sensory Exam - grossly normal Reflexes:trace Coordination - grossly normal Gait - grossly normal Balance - grossly normal Cranial Nerves: I: smell Not tested  II: visual acuity  OS: nl    OD: nl  II: visual fields Full to confrontation  II: pupils Equal, round, reactive to light  III,VII: ptosis None  III,IV,VI: extraocular muscles  Full ROM  V: mastication Normal  V: facial light touch sensation  Normal  V,VII: corneal reflex  Present  VII: facial muscle function - upper  Normal  VII: facial muscle function - lower Normal  VIII: hearing Not tested  IX: soft palate elevation  Normal  IX,X: gag reflex Present  XI: trapezius strength  5/5  XI: sternocleidomastoid strength 5/5  XI: neck flexion strength  5/5  XII: tongue strength  Normal     Data Review Lab Results  Component Value Date   WBC 9.3 07/17/2016   HGB 14.8 07/17/2016   HCT 46.7 07/17/2016   MCV 91.0 07/17/2016   PLT 237 07/17/2016   Lab Results  Component Value Date   NA 135 07/17/2016   K 4.3 07/17/2016   CL 101 07/17/2016   CO2 27 07/17/2016   BUN 15 07/17/2016   CREATININE 0.99 07/17/2016   GLUCOSE 248 (H) 07/17/2016   Lab Results  Component Value Date   INR 1.02 07/17/2016    Assessment/Plan: Patient admitted for ALIF L4-5. Patient has failed a reasonable attempt at conservative therapy.  I explained the condition and procedure to the patient and answered any questions.  Patient wishes to proceed with procedure as planned. Understands risks/ benefits and typical outcomes of procedure.   Akhila Mahnken S 07/25/2016 8:13 AM

## 2016-07-25 NOTE — Transfer of Care (Signed)
Immediate Anesthesia Transfer of Care Note  Patient: David Mcdowell  Procedure(s) Performed: Procedure(s): LUMBAR FOUR-FIVE ANTERIOR LUMBAR INTERBODY FUSION (N/A) ABDOMINAL EXPOSURE (N/A)  Patient Location: PACU  Anesthesia Type:General  Level of Consciousness: awake, alert  and oriented  Airway & Oxygen Therapy: Patient Spontanous Breathing and Patient connected to face mask oxygen  Post-op Assessment: Report given to RN and Post -op Vital signs reviewed and stable  Post vital signs: Reviewed and stable  Last Vitals:  Vitals:   07/25/16 0652  BP: 132/76  Pulse: 91  Resp: 18  Temp: 36.7 C    Last Pain:  Vitals:   07/25/16 0652  TempSrc: Oral         Complications: No apparent anesthesia complications

## 2016-07-25 NOTE — Op Note (Signed)
    OPERATIVE REPORT  DATE OF SURGERY: 07/25/2016  PATIENT: David Mcdowell, 61 y.o. male MRN: [id number]  DOB: 1955-12-14  PRE-OPERATIVE DIAGNOSIS: Degenerative disc disease L4-5  POST-OPERATIVE DIAGNOSIS:  Same  PROCEDURE: Anterior exposure for L4-5 disc surgery  SURGEON:  Curt Jews, M.D.  Co-surgeon for the exposure Dr. Sherley Bounds  ANESTHESIA:  Gen.  EBL: 150 ml  Total I/O In: 1000 [I.V.:1000] Out: 630 [Urine:480; Blood:150]  BLOOD ADMINISTERED: None  DRAINS: None  SPECIMEN: None  COUNTS CORRECT:  YES  PLAN OF CARE: PACU   PATIENT DISPOSITION:  PACU - hemodynamically stable  PROCEDURE DETAILS: The patient was taken to the operating placed supine position where the area of the abdomen prepped and draped in usual sterile fashion. C-arm was used to determine the level of the L4-5 disc. Incision was made from the midline to the left and carried down through the subcutaneous fat with electrocautery. Patient said morbidly obese. Anterior rectus sheath was opened in line with the skin incision. The rectus muscle was mobilized circumferentially. The retroperitoneal space was entered bluntly and the left lower quadrant and the peritoneal contents were mobilized to the right. The posterior rectus sheath was opened laterally. Blunt dissection was continued down to the level of the iliac vessels. The iliac vein was exposed and the left iliolumbar vein was ligated with 2-0 silk ties and divided. Blunt mobilization was continued to mobilize the left iliac vein and aorta to the right. Patient is a large lumbar artery which was controlled with ligaclips and divided. Blunt dissection continued to of give full access to the L4-5 disc. The reverse lip 200 cm blades were positioned to the right and left of the L4-5 disc and the 190 malleable blades were used for inferior and superior exposure. A needle was placed in the 45 disc space and C-arm was brought back onto the field to confirm that  this was 45. The remainder the procedure will be dictated as a separate note by Dr. Richardson Landry, M.D., [facility] 07/25/2016 1:49 PM

## 2016-07-26 ENCOUNTER — Encounter (HOSPITAL_COMMUNITY): Payer: Self-pay | Admitting: Neurological Surgery

## 2016-07-26 LAB — GLUCOSE, CAPILLARY
GLUCOSE-CAPILLARY: 89 mg/dL (ref 65–99)
Glucose-Capillary: 253 mg/dL — ABNORMAL HIGH (ref 65–99)
Glucose-Capillary: 134 mg/dL — ABNORMAL HIGH (ref 65–99)

## 2016-07-26 MED ORDER — METHOCARBAMOL 500 MG PO TABS: 500.0000 mg | ORAL_TABLET | Freq: Four times a day (QID) | ORAL | 1 refills | Status: DC | PRN

## 2016-07-26 MED ORDER — HYDROMORPHONE HCL 4 MG PO TABS: 4.0000 mg | ORAL_TABLET | Freq: Four times a day (QID) | ORAL | 0 refills | Status: DC | PRN

## 2016-07-26 NOTE — Evaluation (Signed)
Physical Therapy Evaluation and Discharge Patient Details Name: David Mcdowell MRN: [id number] DOB: 20-Dec-1955 Today's Date: 07/26/2016   History of Present Illness  Pt is a 61 y/o male who presents s/p L4-L5 anterior lumbar fusion. PMH significant for DMII, neuropathy.  Clinical Impression  Patient evaluated by Physical Therapy with no further acute PT needs identified. All education has been completed and the patient has no further questions. At the time of PT eval pt was able to perform transfers and ambulation with gross modified independence. Pt was education on precautions, brace application and wearing schedule, and general safety with mobility/walking program. See below for any follow-up Physical Therapy or equipment needs. PT is signing off. Thank you for this referral.     Follow Up Recommendations No PT follow up;Supervision - Intermittent    Equipment Recommendations  None recommended by PT    Recommendations for Other Services       Precautions / Restrictions Precautions Precautions: Fall;Back Precaution Booklet Issued: Yes (comment) Precaution Comments: Precaution sheet reviewed in detail. Pt was cued for precautions during functional mobility.  Required Braces or Orthoses: Spinal Brace Spinal Brace: Lumbar corset;Applied in sitting position Restrictions Weight Bearing Restrictions: No      Mobility  Bed Mobility               General bed mobility comments: Pt received exiting room to go for a walk  Transfers Overall transfer level: Modified independent Equipment used: None Transfers: Sit to/from Stand           General transfer comment: No assist required, no unsteadiness noted. Pt demonstrated proper hand placement on seated surface for safety.  Ambulation/Gait Ambulation/Gait assistance: Modified independent (Device/Increase time) Ambulation Distance (Feet): 200 Feet Assistive device: None Gait Pattern/deviations: Step-through pattern;Decreased  stride length Gait velocity: Decreased Gait velocity interpretation: Below normal speed for age/gender General Gait Details: VC's for improved posture and general safety. Pt educated on walking program and recommendations for activity progression at home.   Stairs Stairs: Yes Stairs assistance: Supervision Stair Management: One rail Right;Step to pattern;Forwards Number of Stairs: 10 General stair comments: VC's for sequencing and safety.  Wheelchair Mobility    Modified Rankin (Stroke Patients Only)       Balance Overall balance assessment: No apparent balance deficits (not formally assessed)                                           Pertinent Vitals/Pain Pain Assessment: Faces Faces Pain Scale: Hurts a little bit Pain Location: Incision site Pain Descriptors / Indicators: Operative site guarding Pain Intervention(s): Limited activity within patient's tolerance;Monitored during session;Repositioned    Home Living Family/patient expects to be discharged to:: Private residence Living Arrangements: Spouse/significant other Available Help at Discharge: Family;Available 24 hours/day Type of Home: House Home Access: Stairs to enter Entrance Stairs-Rails: None Entrance Stairs-Number of Steps: 2 Home Layout: Two level;Able to live on main level with bedroom/bathroom Home Equipment: Kasandra Knudsen - single point;Walker - 2 wheels;Bedside commode;Shower seat      Prior Function Level of Independence: Independent;Needs assistance      ADL's / Homemaking Assistance Needed: Wife assisted with socks/shoes        Hand Dominance   Dominant Hand: Right    Extremity/Trunk Assessment   Upper Extremity Assessment Upper Extremity Assessment: Defer to OT evaluation    Lower Extremity Assessment Lower Extremity Assessment: Generalized weakness (  consistent with pre-op diagnosis)    Cervical / Trunk Assessment Cervical / Trunk Assessment: Other exceptions Cervical  / Trunk Exceptions: s/p surgery  Communication   Communication: No difficulties  Cognition Arousal/Alertness: Awake/alert Behavior During Therapy: WFL for tasks assessed/performed Overall Cognitive Status: Within Functional Limits for tasks assessed                                        General Comments      Exercises     Assessment/Plan    PT Assessment Patent does not need any further PT services  PT Problem List         PT Treatment Interventions      PT Goals (Current goals can be found in the Care Plan section)  Acute Rehab PT Goals Patient Stated Goal: return to independence PT Goal Formulation: All assessment and education complete, DC therapy    Frequency     Barriers to discharge        Co-evaluation               AM-PAC PT "6 Clicks" Daily Activity  Outcome Measure Difficulty turning over in bed (including adjusting bedclothes, sheets and blankets)?: None Difficulty moving from lying on back to sitting on the side of the bed? : None Difficulty sitting down on and standing up from a chair with arms (e.g., wheelchair, bedside commode, etc,.)?: None Help needed moving to and from a bed to chair (including a wheelchair)?: None Help needed walking in hospital room?: None Help needed climbing 3-5 steps with a railing? : None 6 Click Score: 24    End of Session Equipment Utilized During Treatment: Back brace Activity Tolerance: Patient tolerated treatment well Patient left: in chair;with call bell/phone within reach;with family/visitor present Nurse Communication: Mobility status PT Visit Diagnosis: Pain;Other symptoms and signs involving the nervous system (R29.898) Pain - part of body:  (back)    Time: 1287-8676 PT Time Calculation (min) (ACUTE ONLY): 16 min   Charges:   PT Evaluation $PT Eval Moderate Complexity: 1 Procedure     PT G Codes:        David Mcdowell, PT, DPT Acute Rehabilitation Services Pager: [phone]    Thelma Comp 07/26/2016, 11:22 AM

## 2016-07-26 NOTE — Discharge Summary (Signed)
Physician Discharge Summary  Patient ID: David Mcdowell MRN: [id number] DOB/AGE: September 07, 1955 61 y.o.  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 The patient tolerated the procedure well and was taken to the recovery room and then to the floor in stable condition. The hospital course was routine. There were no complications. The wound remained clean dry and intact. Pt had appropriate back soreness. No complaints of leg pain or new N/T/W. The patient remained afebrile with stable vital signs, and tolerated a regular diet. The patient continued to increase activities, and pain was well controlled with oral pain medications.   Consults: None  Significant Diagnostic Studies:  Results for orders placed or performed during the hospital encounter of 07/25/16  Glucose, capillary  Result Value Ref Range   Glucose-Capillary 95 65 - 99 mg/dL   Comment 1 Notify RN    Comment 2 Document in Chart   Glucose, capillary  Result Value Ref Range   Glucose-Capillary 253 (H) 65 - 99 mg/dL  Glucose, capillary  Result Value Ref Range   Glucose-Capillary 352 (H) 65 - 99 mg/dL  Glucose, capillary  Result Value Ref Range   Glucose-Capillary 379 (H) 65 - 99 mg/dL   Comment 1 Notify RN    Comment 2 Document in Chart   Glucose, capillary  Result Value Ref Range   Glucose-Capillary 253 (H) 65 - 99 mg/dL   Comment 1 Notify RN    Comment 2 Document in Chart   Glucose, capillary  Result Value Ref Range   Glucose-Capillary 134 (H) 65 - 99 mg/dL   Comment 1 Notify RN    Comment 2 Document in Chart     Chest 2 View  Result Date: 07/17/2016 CLINICAL DATA:  Pre-op for ALIF on 7/18. Pt denies all chest complaints at this time. Hx diabetes and HTN controlled with medication. EXAM: CHEST   2 VIEW COMPARISON:  None. FINDINGS: Heart size and mediastinal contours are within normal limits. Lungs are clear. No pleural effusion or pneumothorax seen. Osseous structures about the chest are unremarkable. Elevation of the right hemidiaphragm, of uncertain chronicity. IMPRESSION: No active cardiopulmonary disease. No evidence of pneumonia or pulmonary edema. Electronically Signed   By: Franki Cabot M.D.   On: 07/17/2016 12:38   Dg Lumbar Spine 2-3 Views  Result Date: 07/25/2016 CLINICAL DATA:  L4-5 anterior interbody fusion EXAM: DG C-ARM 61-120 MIN; LUMBAR SPINE - 2-3 VIEW COMPARISON:  05/21/2016 FINDINGS: Anterior interbody fusion device in satisfactory position at L4-5. Normal alignment. IMPRESSION: Satisfactory ALIF L4-5 Electronically Signed   By: Franchot Gallo M.D.   On: 07/25/2016 13:11   Dg C-arm 61-120 Min  Result Date: 07/25/2016 CLINICAL DATA:  L4-5 anterior interbody fusion EXAM: DG C-ARM 61-120 MIN; LUMBAR SPINE - 2-3 VIEW COMPARISON:  05/21/2016 FINDINGS: Anterior interbody fusion device in satisfactory position at L4-5. Normal alignment. IMPRESSION: Satisfactory ALIF L4-5 Electronically Signed   By: Franchot Gallo M.D.   On: 07/25/2016 13:11   Dg Or Local Abdomen  Result Date: 07/25/2016 CLINICAL DATA:  Surgery, elective. EXAM: OR LOCAL ABDOMEN COMPARISON:  Lumbar spine radiographs 05/21/2016. FINDINGS: Discectomy and prosthesis at L4-5 is noted. The cable projects of the left-sided the abdomen. No other focal radiopaque foreign body is present. IMPRESSION: 1. Discectomy and a metallic disc spacer at  L4-5. 2. No other radiopaque foreign body. These results will be called to the ordering clinician or representative by the Radiologist Assistant, and communication documented in the PACS or zVision Dashboard. Electronically Signed   By: San Morelle M.D.   On: 07/25/2016 12:28    Antibiotics:  Anti-infectives    Start     Dose/Rate Route Frequency Ordered Stop   07/25/16  1700  ceFAZolin (ANCEF) IVPB 2g/100 mL premix     2 g 200 mL/hr over 30 Minutes Intravenous Every 8 hours 07/25/16 1450 07/26/16 0032   07/25/16 1013  bacitracin 50,000 Units in sodium chloride irrigation 0.9 % 500 mL irrigation  Status:  Discontinued       As needed 07/25/16 1014 07/25/16 1235   07/25/16 0600  ceFAZolin (ANCEF) 3 g in dextrose 5 % 50 mL IVPB     3 g 130 mL/hr over 30 Minutes Intravenous On call to O.R. 07/24/16 1112 07/25/16 0844      Discharge Exam: Blood pressure (!) 99/59, pulse 100, temperature 97.8 F (36.6 C), temperature source Oral, resp. rate 20, weight 130.2 kg (287 lb), SpO2 97 %. Neurologic: Grossly normal Incision CDI  Discharge Medications:   Allergies as of 07/26/2016   No Active Allergies     Medication List    TAKE these medications   aspirin EC 81 MG tablet Take 81 mg by mouth daily.   chlorthalidone 25 MG tablet Commonly known as:  HYGROTON Take 0.5 tablets (12.5 mg total) by mouth daily.   HUMULIN R 500 UNIT/ML injection Generic drug:  insulin regular human CONCENTRATED 2 Units by Continuous infusion (non-IV) route continuous. 3 different basal rates 3 different bolus rates Use U-100 when hospitalized   HYDROmorphone 4 MG tablet Commonly known as:  DILAUDID Take 1 tablet (4 mg total) by mouth every 6 (six) hours as needed for severe pain. 4 mg every morning, and 8 mg at bedtime What changed:  when to take this  reasons to take this   ibuprofen 200 MG tablet Commonly known as:  ADVIL,MOTRIN Take 800 mg by mouth every 8 (eight) hours as needed for mild pain or moderate pain. On hold for surgery- since 07/14/2016   insulin pump Soln Inject into the skin daily. Insulin Concentrated (Humulin R-500) unit/ml   lisinopril 40 MG tablet Commonly known as:  PRINIVIL,ZESTRIL Take 1 tablet (40 mg total) by mouth daily.   metFORMIN 1000 MG tablet Commonly known as:  GLUCOPHAGE Take 1,000 mg by mouth 2 (two) times daily with a meal.    methocarbamol 500 MG tablet Commonly known as:  ROBAXIN Take 1 tablet (500 mg total) by mouth every 6 (six) hours as needed for muscle spasms.   metoprolol succinate 50 MG 24 hr tablet Commonly known as:  TOPROL-XL Take 1 tablet (50 mg total) by mouth daily. Take with or immediately following a meal.   omeprazole 20 MG capsule Commonly known as:  PRILOSEC Take 1 capsule (20 mg total) by mouth daily.   pravastatin 80 MG tablet Commonly known as:  PRAVACHOL Take 1 tablet (80 mg total) by mouth daily.            Durable Medical Equipment        Start     Ordered   07/25/16 1451  DME Walker rolling  Once    Question:  Patient needs a walker to treat with the following condition  Answer:  S/P lumbar fusion   07/25/16 1450   07/25/16 1451  DME 3 n 1  Once     07/25/16 1450      Disposition: home   Final Dx: ALIF L4-5       Signed: JONES,DAVID S 07/26/2016, 7:36 AM

## 2016-07-26 NOTE — Progress Notes (Signed)
Spoke with patient about his insulin pump and diabetes. Was diagnosed about 30 years ago, was on oral medications for many years. Has been on a Medtronic pump for about 6-7 years. He actually runs U-500 insulin in his pump.  His basal rates total 29.25 units/day.(U-500 insulin) States that he also uses a [employer] sensor and has lowered his HgbA1C to 7.3% currently. He does have issues with the sensor not sticking for 10 days, but the company has replaced it and he has started using an adhesive called Mastisol that helps to hold the sensor on for 10 days.  Patient states that his surgical experience has been much better this time than his last surgery. CBG this am was 89 mg/dl.    Harvel Ricks RN BSN CDE Diabetes Coordinator Pager: [phone]  8am-5pm

## 2016-07-26 NOTE — Progress Notes (Signed)
Occupational Therapy Evaluation Patient Details Name: David Mcdowell MRN: [id number] DOB: 1955/02/11 Today's Date: 07/26/2016    History of Present Illness Pt is a 61 y/o male who presents s/p L4-L5 anterior lumbar fusion. PMH significant for DMII, neuropathy.   Clinical Impression   Completed all education regarding ADL and functional mobility while adhering to back precautions. Pt verbalized understanding. Pt has all DME and AE needed and wife able to assist as needed. OT signing off.     Follow Up Recommendations    none   Equipment Recommendations    none   Recommendations for Other Services       Precautions / Restrictions Precautions Precautions: Fall;Back Precaution Booklet Issued: Yes (comment) Precaution Comments: Precaution sheet reviewed in detail. Pt was cued for precautions during functional mobility.  Required Braces or Orthoses: Spinal Brace Spinal Brace: Lumbar corset;Applied in sitting position Restrictions Weight Bearing Restrictions: No      Mobility Bed Mobility Overal bed mobility: Modified Independent             General bed mobility comments: Pt received exiting room to go for a walk  Transfers Overall transfer level: Modified independent Equipment used: None Transfers: Sit to/from Stand           General transfer comment: No assist required, no unsteadiness noted. Pt demonstrated proper hand placement on seated surface for safety.    Balance Overall balance assessment: No apparent balance deficits (not formally assessed)                                         ADL either performed or assessed with clinical judgement   ADL Overall ADL's : Needs assistance/impaired                              Pt has difficulty with LB ADL. Reviewed techniques to complete using AE.       Functional mobility during ADLs: Modified independent General ADL Comments: Completed education regarding compensatory technqieus  for ADL adhering to back precautions. Pt has AE to use as needed.      Vision Baseline Vision/History: Wears glasses       Perception     Praxis      Pertinent Vitals/Pain Pain Assessment: 0-10 Pain Score: 2  Faces Pain Scale: Hurts a little bit Pain Location: Incision site Pain Descriptors / Indicators: Operative site guarding Pain Intervention(s): Limited activity within patient's tolerance     Hand Dominance Right   Extremity/Trunk Assessment Upper Extremity Assessment Upper Extremity Assessment: Overall WFL for tasks assessed   Lower Extremity Assessment Lower Extremity Assessment: Defer to PT evaluation   Cervical / Trunk Assessment Cervical / Trunk Assessment: Other exceptions Cervical / Trunk Exceptions: s/p surgery   Communication Communication Communication: No difficulties   Cognition Arousal/Alertness: Awake/alert Behavior During Therapy: WFL for tasks assessed/performed Overall Cognitive Status: Within Functional Limits for tasks assessed                                     General Comments       Exercises     Shoulder Instructions      Home Living Family/patient expects to be discharged to:: Private residence Living Arrangements: Spouse/significant other Available Help at Discharge: Family;Available 24 hours/day  Type of Home: House Home Access: Stairs to enter [employer] of Steps: 2 Entrance Stairs-Rails: None Home Layout: Two level;Able to live on main level with bedroom/bathroom     Bathroom Shower/Tub: Teacher, early years/pre: Handicapped height Bathroom Accessibility: Yes How Accessible: Accessible via walker Home Equipment: [place] - single point;Walker - 2 wheels;Bedside commode;Shower seat          Prior Functioning/Environment Level of Independence: Independent;Needs assistance    ADL's / Homemaking Assistance Needed: Wife assisted with socks/shoes            OT Problem List:  Decreased range of motion;Decreased knowledge of use of DME or AE;Decreased knowledge of precautions;Obesity;Pain      OT Treatment/Interventions:      OT Goals(Current goals can be found in the care plan section) Acute Rehab OT Goals Patient Stated Goal: return to independence OT Goal Formulation: All assessment and education complete, DC therapy  OT Frequency:     Barriers to D/C:            Co-evaluation              AM-PAC PT "6 Clicks" Daily Activity     Outcome Measure Help from another person eating meals?: None Help from another person taking care of personal grooming?: None Help from another person toileting, which includes using toliet, bedpan, or urinal?: None Help from another person bathing (including washing, rinsing, drying)?: A Little Help from another person to put on and taking off regular upper body clothing?: None Help from another person to put on and taking off regular lower body clothing?: A Little 6 Click Score: 22   End of Session Equipment Utilized During Treatment: Back brace Nurse Communication: Mobility status  Activity Tolerance: Patient tolerated treatment well Patient left: in chair;with call bell/phone within reach  OT Visit Diagnosis: Pain;Muscle weakness (generalized) (M62.81) Pain - part of body:  (back)                Time: 0158-6825 OT Time Calculation (min): 17 min Charges:  OT General Charges $OT Visit: 1 Procedure OT Evaluation $OT Eval Low Complexity: 1 Procedure G-Codes:     David Mcdowell, OT/L  [phone] 07/26/2016  David Mcdowell,David Mcdowell 07/26/2016, 1:59 PM

## 2016-07-26 NOTE — Progress Notes (Signed)
Pt given D/C instructions with Rx's, verbal understanding was provided. Pt's incision is clean and dry with no sign of infection. Pt's IV was removed prior to D/C. Pt D/C'd home via wheelchair per MD order. Pt is stable @ D/C and has no other needs at this time. Jshawn Hurta, RN  

## 2016-07-26 NOTE — Progress Notes (Signed)
Subjective: Interval History: none.. Looks great this morning. Reports mild soreness. Has been up walking several times through the night with no difficulty.  Objective: Vital signs in last 24 hours: Temp:  [97.6 F (36.4 C)-99 F (37.2 C)] 97.8 F (36.6 C) (07/19 0413) Pulse Rate:  [88-116] 100 (07/19 0413) Resp:  [13-22] 20 (07/19 0413) BP: (92-128)/(52-67) 99/59 (07/19 0413) SpO2:  [93 %-98 %] 97 % (07/19 0413)  Intake/Output from previous day: 07/18 0701 - 07/19 0700 In: 1780 [P.O.:480; I.V.:1200; IV Piggyback:100] Out: 1530 [Urine:1380; Blood:150] Intake/Output this shift: No intake/output data recorded.  Lower quadrant incision looks good. Palpable pedal pulses.  Lab Results: No results for input(s): WBC, HGB, HCT, PLT in the last 72 hours. BMET No results for input(s): NA, K, CL, CO2, GLUCOSE, BUN, CREATININE, CALCIUM in the last 72 hours.  Studies/Results: Chest 2 View  Result Date: 07/17/2016 CLINICAL DATA:  Pre-op for ALIF on 7/18. Pt denies all chest complaints at this time. Hx diabetes and HTN controlled with medication. EXAM: CHEST  2 VIEW COMPARISON:  None. FINDINGS: Heart size and mediastinal contours are within normal limits. Lungs are clear. No pleural effusion or pneumothorax seen. Osseous structures about the chest are unremarkable. Elevation of the right hemidiaphragm, of uncertain chronicity. IMPRESSION: No active cardiopulmonary disease. No evidence of pneumonia or pulmonary edema. Electronically Signed   By: Franki Cabot M.D.   On: 07/17/2016 12:38   Dg Lumbar Spine 2-3 Views  Result Date: 07/25/2016 CLINICAL DATA:  L4-5 anterior interbody fusion EXAM: DG C-ARM 61-120 MIN; LUMBAR SPINE - 2-3 VIEW COMPARISON:  05/21/2016 FINDINGS: Anterior interbody fusion device in satisfactory position at L4-5. Normal alignment. IMPRESSION: Satisfactory ALIF L4-5 Electronically Signed   By: Franchot Gallo M.D.   On: 07/25/2016 13:11   Dg C-arm 61-120 Min  Result Date:  07/25/2016 CLINICAL DATA:  L4-5 anterior interbody fusion EXAM: DG C-ARM 61-120 MIN; LUMBAR SPINE - 2-3 VIEW COMPARISON:  05/21/2016 FINDINGS: Anterior interbody fusion device in satisfactory position at L4-5. Normal alignment. IMPRESSION: Satisfactory ALIF L4-5 Electronically Signed   By: Franchot Gallo M.D.   On: 07/25/2016 13:11   Dg Or Local Abdomen  Result Date: 07/25/2016 CLINICAL DATA:  Surgery, elective. EXAM: OR LOCAL ABDOMEN COMPARISON:  Lumbar spine radiographs 05/21/2016. FINDINGS: Discectomy and prosthesis at L4-5 is noted. The cable projects of the left-sided the abdomen. No other focal radiopaque foreign body is present. IMPRESSION: 1. Discectomy and a metallic disc spacer at M0-8. 2. No other radiopaque foreign body. These results will be called to the ordering clinician or representative by the Radiologist Assistant, and communication documented in the PACS or zVision Dashboard. Electronically Signed   By: San Morelle M.D.   On: 07/25/2016 12:28   Anti-infectives: Anti-infectives    Start     Dose/Rate Route Frequency Ordered Stop   07/25/16 1700  ceFAZolin (ANCEF) IVPB 2g/100 mL premix     2 g 200 mL/hr over 30 Minutes Intravenous Every 8 hours 07/25/16 1450 07/26/16 0032   07/25/16 1013  bacitracin 50,000 Units in sodium chloride irrigation 0.9 % 500 mL irrigation  Status:  Discontinued       As needed 07/25/16 1014 07/25/16 1235   07/25/16 0600  ceFAZolin (ANCEF) 3 g in dextrose 5 % 50 mL IVPB     3 g 130 mL/hr over 30 Minutes Intravenous On call to O.R. 07/24/16 1112 07/25/16 0844      Assessment/Plan: s/p Procedure(s): LUMBAR FOUR-FIVE ANTERIOR LUMBAR INTERBODY FUSION (N/A) ABDOMINAL  EXPOSURE (N/A) stable postop  or vomiting. Will not follow actively. These call if we can assist.   LOS: 1 day   Raeanne Deschler 07/26/2016, 7:09 AM

## 2016-07-26 NOTE — Discharge Instructions (Signed)

## 2016-07-26 NOTE — Anesthesia Postprocedure Evaluation (Signed)
Anesthesia Post Note  Patient: YOSHITO GAZA  Procedure(s) Performed: Procedure(s) (LRB): LUMBAR FOUR-FIVE ANTERIOR LUMBAR INTERBODY FUSION (N/A) ABDOMINAL EXPOSURE (N/A)     Patient location during evaluation: PACU Anesthesia Type: General Level of consciousness: awake and alert Pain management: pain level controlled Vital Signs Assessment: post-procedure vital signs reviewed and stable Respiratory status: spontaneous breathing, nonlabored ventilation, respiratory function stable and patient connected to nasal cannula oxygen Cardiovascular status: blood pressure returned to baseline and stable Postop Assessment: no signs of nausea or vomiting Anesthetic complications: no    Last Vitals:  Vitals:   07/26/16 0413 07/26/16 0815  BP: (!) 99/59 112/65  Pulse: 100 87  Resp: 20 18  Temp: 36.6 C 36.6 C    Last Pain:  Vitals:   07/26/16 0454  TempSrc:   PainSc: 4                  Yamileth Hayse S

## 2016-08-02 IMAGING — MR MR LUMBAR SPINE W/O CM
4 of 5 series · 18 of 48 positions shown · non-contrast
Comparison: MRI lumbar spine 03/05/2005.

CLINICAL DATA: Shooting right leg pain and numbness for 4 months.
History of lumbar surgery in 1220. No known injury. Initial
encounter.

EXAM:
MRI LUMBAR SPINE WITHOUT CONTRAST
TECHNIQUE: Multiplanar, multisequence MR imaging of the lumbar spine was
performed. No intravenous contrast was administered.

[Series 6: T2 · sagittal · 4.0mm · 0.78mm/px · 6 of 15 slices shown (1 of 2)]
[im 1/15]
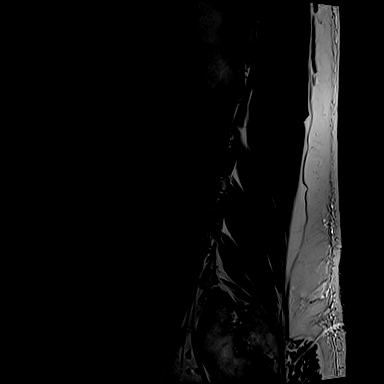
[im 3/15]
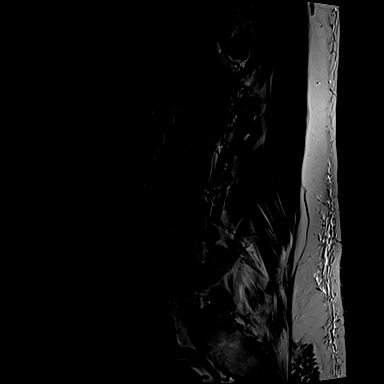
[im 6/15]
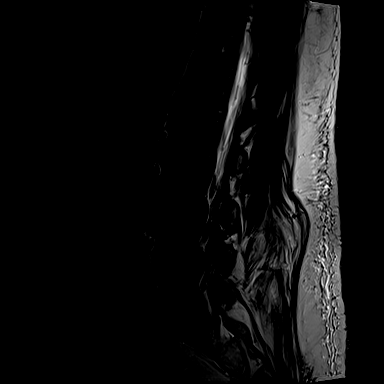
[im 9/15]
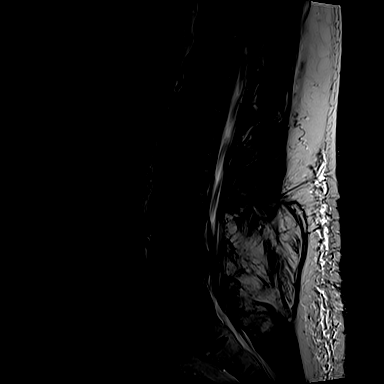
[im 12/15]
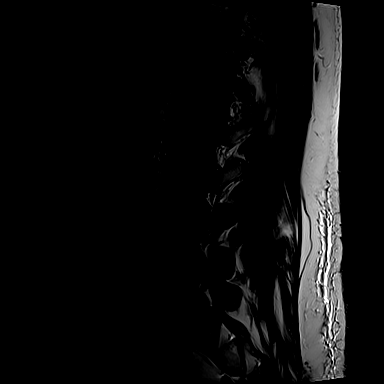
[im 15/15]
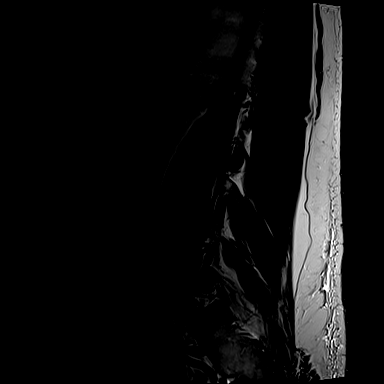

[Series 8: T1 · sagittal · 4.0mm · 0.78mm/px · 3 of 15 slices shown (1 of 2)]
[im 3/15]
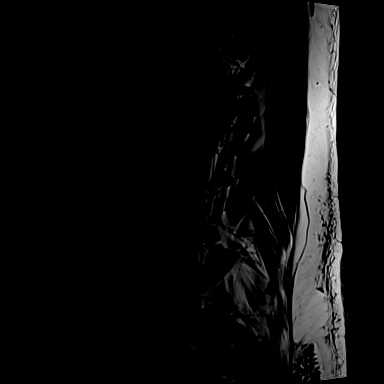
[im 9/15]
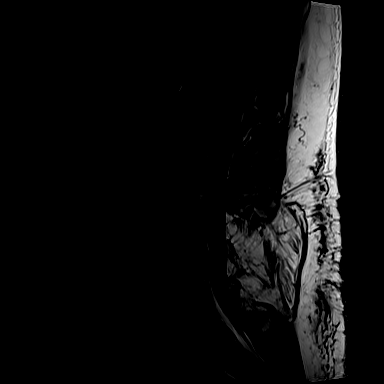
[im 15/15]
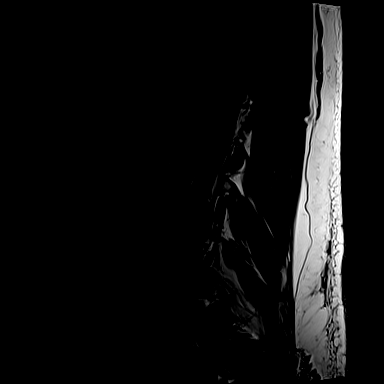

[Series 9: T1 · axial · 4.0mm · 0.28mm/px · z∈[-86,+61]mm · 3 of 35 slices shown (2 of 2)]
[im 5/35]
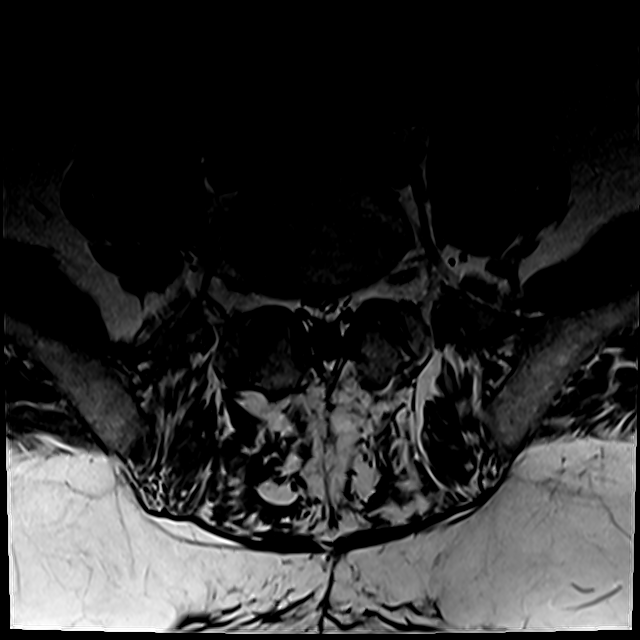
[im 18/35]
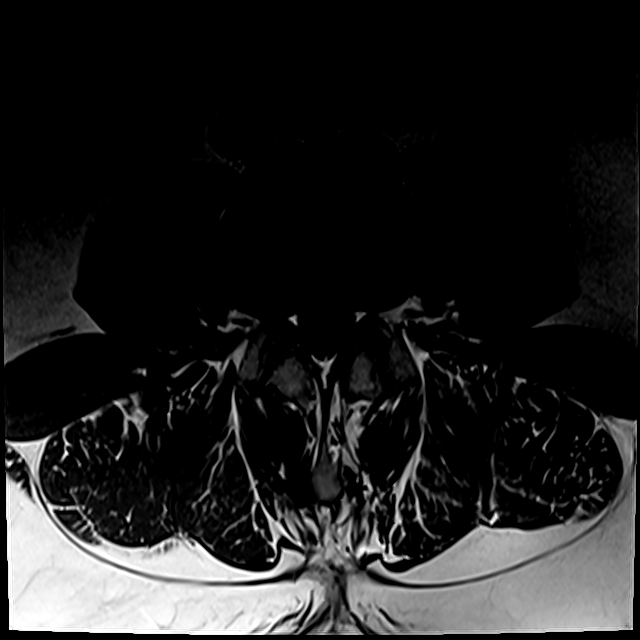
[im 30/35]
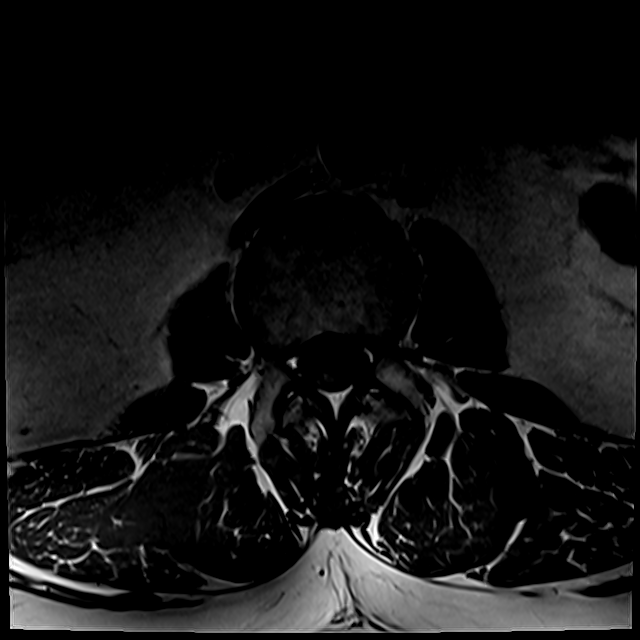

[Series 13: T2 · axial · 4.0mm · 0.28mm/px · z∈[-105,+61]mm · 6 of 35 slices shown (2 of 2)]
[im 1/35]
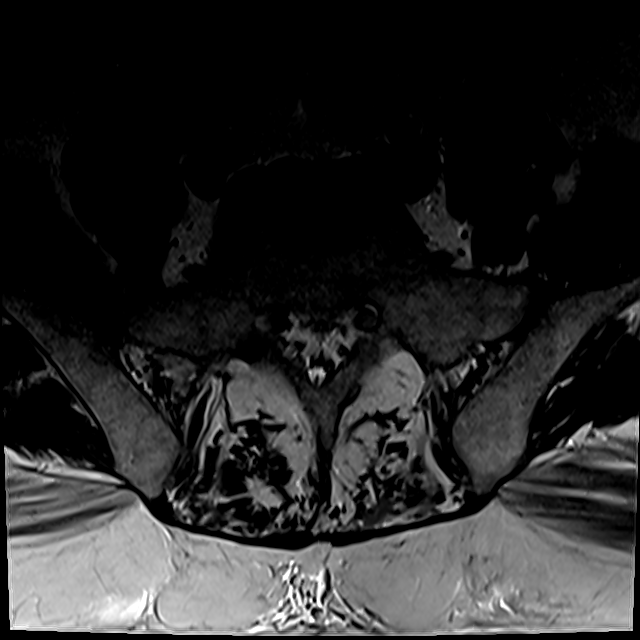
[im 5/35]
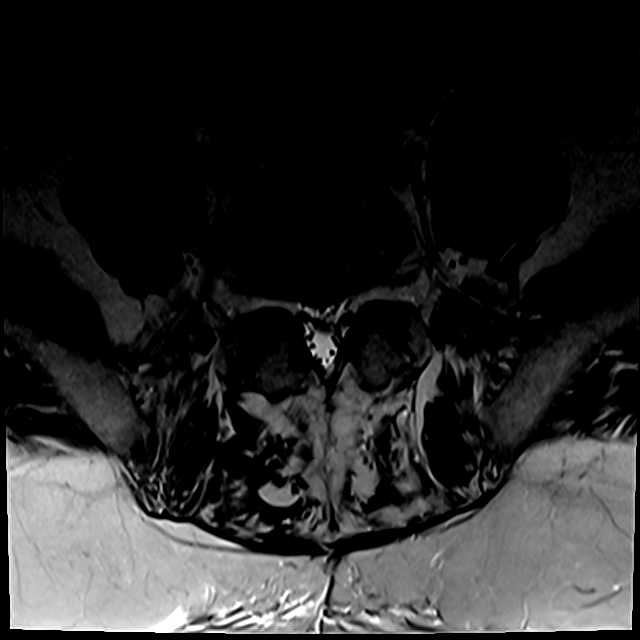
[im 10/35]
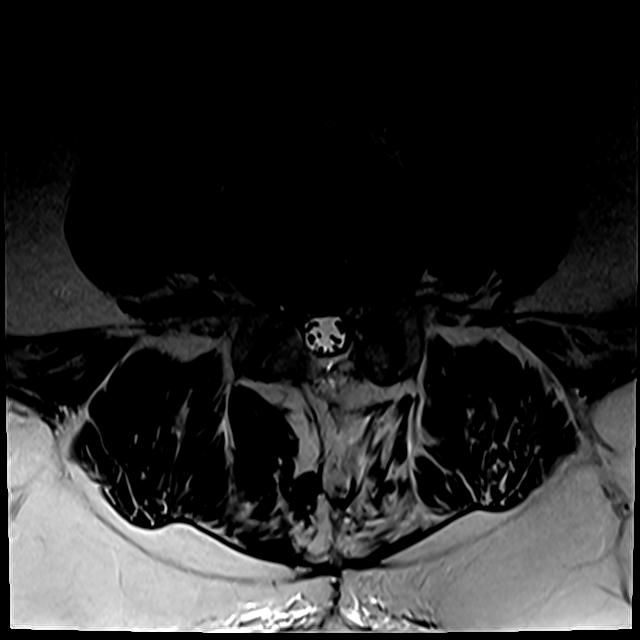
[im 15/35]
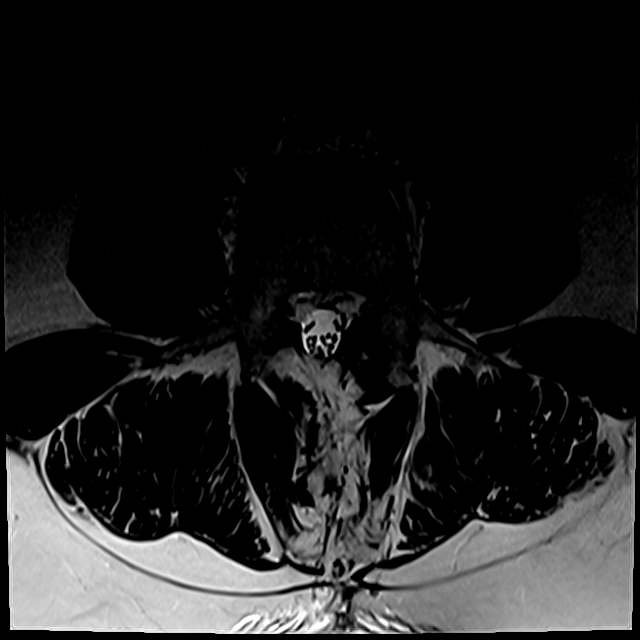
[im 18/35]
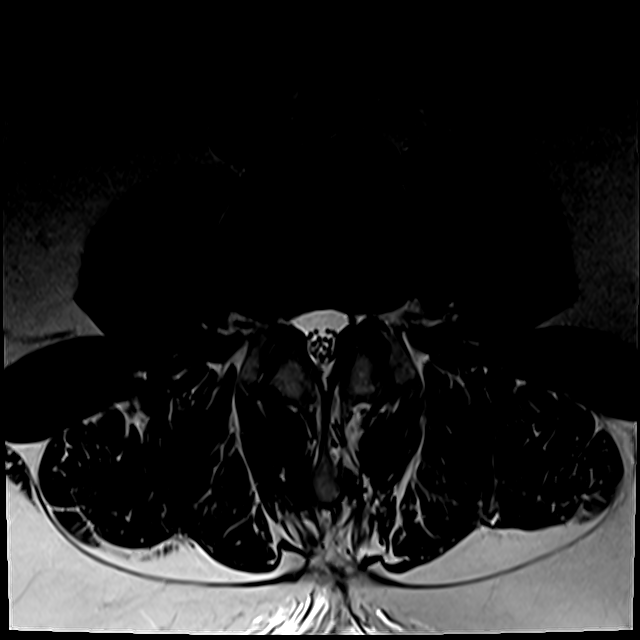
[im 30/35]
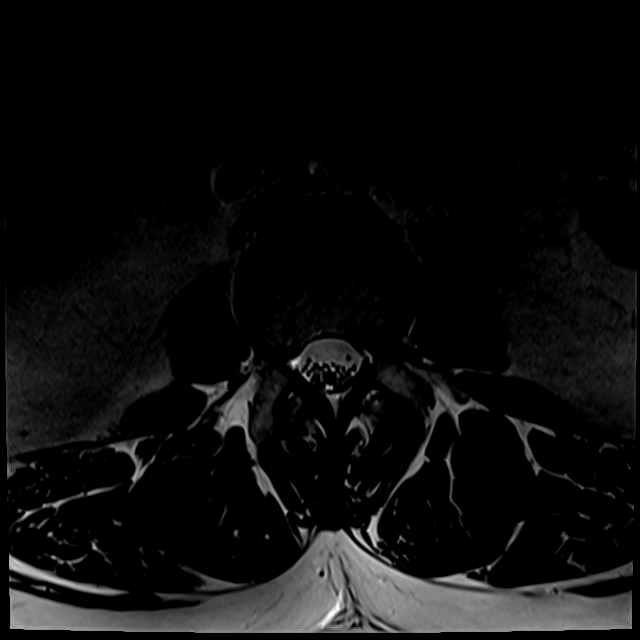

[18 of 48 positions shown; findings below may reference images not displayed]

FINDINGS: Vertebral body height and alignment are maintained. Mild
degenerative endplate signal change is present at L4-5. Marrow
signal is otherwise unremarkable. The conus medullaris is normal in
signal and position. Imaged intra-abdominal contents appear normal.

T11-12 and T12-L1 are imaged in the sagittal plane only and
negative.

L1-2:  Negative.

L2-3:  Mild facet degenerative change.  Otherwise negative.

L3-4: Mild facet arthropathy. No disc bulge or protrusion. The
central canal and foramina are open.

L4-5: Status post posterior decompression. Disc bulge and endplate
spur causing narrowing in the lateral recesses, greater on the
right, which could impact the descending L5 roots are unchanged. The
thecal sac is widely decompressed. The foramina are open.

L5-S1: Status post decompression. Minimal disc bulge without central
canal or foraminal stenosis.
IMPRESSION: No change in the appearance of the lumbar spine since the comparison
MRI.

Status post decompression at L4-5. The thecal sac is widely patent.
Right greater than left lateral recess narrowing due to disc and
endplate spur which could impact either descending L5 root is
unchanged.

## 2016-09-05 ENCOUNTER — Encounter: Payer: Self-pay | Admitting: Gastroenterology

## 2016-09-25 ENCOUNTER — Telehealth: Payer: Self-pay

## 2016-09-25 ENCOUNTER — Encounter: Payer: Self-pay | Admitting: Gastroenterology

## 2016-09-25 ENCOUNTER — Ambulatory Visit (INDEPENDENT_AMBULATORY_CARE_PROVIDER_SITE_OTHER): Payer: Medicare Other | Admitting: Gastroenterology

## 2016-09-25 VITALS — BP 112/64 | HR 100 | Ht 72.0 in | Wt 291.0 lb

## 2016-09-25 DIAGNOSIS — R194 Change in bowel habit: Secondary | ICD-10-CM | POA: Diagnosis not present

## 2016-09-25 DIAGNOSIS — Z1211 Encounter for screening for malignant neoplasm of colon: Secondary | ICD-10-CM

## 2016-09-25 MED ORDER — SUPREP BOWEL PREP KIT 17.5-3.13-1.6 GM/177ML PO SOLN: ORAL | 0 refills | Status: DC

## 2016-09-25 NOTE — Patient Instructions (Signed)
You have been scheduled for a colonoscopy. Please follow written instructions given to you at your visit today.  Please pick up your prep supplies at the pharmacy within the next 1-3 days. If you use inhalers (even only as needed), please bring them with you on the day of your procedure. Your physician has requested that you go to www.[email or web address].com and enter the access code given to you at your visit today. This web site gives a general overview about your procedure. However, you should still follow specific instructions given to you by our office regarding your preparation for the procedure.  Please take Imodium as needed.  We will contact you regarding how to take your insulin prior to your colonoscopy.  If you are age 84 or older, your body mass index should be between 23-30. Your Body mass index is 39.47 kg/m. If this is out of the aforementioned range listed, please consider follow up with your Primary Care Provider.  If you are age 74 or younger, your body mass index should be between 19-25. Your Body mass index is 39.47 kg/m. If this is out of the aformentioned range listed, please consider follow up with your Primary Care Provider.

## 2016-09-25 NOTE — Progress Notes (Signed)
HPI :  61 y/o male with a history of chronic low back pain, type I DM, anxiety, here for a new patient visit for changes in bowel habits, referred by Dr. [name].  He reports he had a lower back surgery a few months ago, A-lift procedure, 2 months post-op. He is recovering okay from this.  He has had some issues with his bowels for several months, he thinks preceeding his surgery. He reports some urgency with his stools and loose stools. He has urgency only sometimes, depends on what he eats. He has a few bowel movements per day. He reports drastic temperature changes can elicit bowel movements - he denies any other symptoms associated with this. No blood in the stools. He has some mild discomfort with the urge otherwise. No nausea or vomiting. No postprandial pain. No dysphagia. Sometimes has lack of appetite. He is not sure if any particular foods which he thinks can drive it.  He thinks his last colonoscopy was about 10 years ago. He has no records of this exam, he thinks done in [place]. Told it was normal. No FH of colon cancer. Mother with polyps. No FH of Crohns / colitis / no celiac disease.  He has been on a muscle relaxer PRN. He has been on metformin.  He takes ibuprofen 600-800mg  q HS and has been on this regimen for several weeks.     Past Medical History:  Diagnosis Date  . Abscess   . Anxiety   . Arthritis   . Chronic back pain   . Essential hypertension, benign   . GERD (gastroesophageal reflux disease)   . History of chest pain    Negative ischemic workup over time  . History of kidney stones    spont. passed, post cytoscope    . Mixed hyperlipidemia   . Myositis   . Neuropathy   . Recurrent kidney stones   . Type 2 diabetes mellitus (HCC)    type I, on insulin pump     Past Surgical History:  Procedure Laterality Date  . ABDOMINAL EXPOSURE N/A 07/25/2016   Procedure: ABDOMINAL EXPOSURE;  Surgeon: Rosetta Posner, MD;  Location: [facility];  Service:  Vascular;  Laterality: N/A;  . ANTERIOR LUMBAR FUSION N/A 07/25/2016   Procedure: LUMBAR FOUR-FIVE ANTERIOR LUMBAR INTERBODY FUSION;  Surgeon: Eustace Moore, MD;  Location: [facility];  Service: Neurosurgery;  Laterality: N/A;  . arthroscopies     both knees   . BACK SURGERY    . CARPAL TUNNEL RELEASE    . DORSAL COMPARTMENT RELEASE Left 12/22/2015   Procedure: RELEASE DORSAL COMPARTMENT (DEQUERVAIN), left wrist;  Surgeon: [name], MD;  Location: [facility];  Service: Orthopedics;  Laterality: Left;  block  . ELBOW SURGERY     both tendon repairs( both elbows)  . INCISION AND DRAINAGE ABSCESS Right 02/06/2014   Procedure: INCISION AND DRAINAGE ABSCESS right groin abcess debridement skin and subqutaneous tissue;  Surgeon: Excell Seltzer, MD;  Location: [facility];  Service: General;  Laterality: Right;  . STERIOD INJECTION Right 12/22/2015   Procedure: STEROID INJECTION;  Surgeon: [name], MD;  Location: [facility];  Service: Orthopedics;  Laterality: Right;  . TOTAL KNEE ARTHROPLASTY Left 01/12/2015   Procedure: LEFT TOTAL KNEE ARTHROPLASTY;  Surgeon: [name], MD;  Location: [facility];  Service: Orthopedics;  Laterality: Left;   Family History  Problem Relation Age of Onset  . Asthma Mother   .  Heart disease Father   . Diabetes Father   . Asthma Sister   . Healthy Brother   . Coronary artery disease Unknown   . Leukemia Paternal Grandfather   . Cirrhosis Maternal Aunt   . Colon cancer Neg Hx   . Rectal cancer Neg Hx   . Esophageal cancer Neg Hx   . Stomach cancer Neg Hx   . Liver cancer Neg Hx    Social History  Substance Use Topics  . Smoking status: Never Smoker  . Smokeless tobacco: Never Used  . Alcohol use No   Current Outpatient Prescriptions  Medication Sig Dispense Refill  . aspirin EC 81 MG tablet Take 81 mg by mouth daily.    . chlorthalidone (HYGROTON) 25 MG tablet Take 0.5 tablets (12.5 mg total) by mouth daily. 45 tablet  3  . HUMULIN R 500 UNIT/ML injection 2 Units by Continuous infusion (non-IV) route continuous. 3 different basal rates 3 different bolus rates Use U-100 when hospitalized    . HYDROmorphone (DILAUDID) 4 MG tablet Take 1 tablet (4 mg total) by mouth every 6 (six) hours as needed for severe pain. 4 mg every morning, and 8 mg at bedtime 30 tablet 0  . ibuprofen (ADVIL,MOTRIN) 200 MG tablet Take 800 mg by mouth every 8 (eight) hours as needed for mild pain or moderate pain. On hold for surgery- since 07/14/2016    . Insulin Human (INSULIN PUMP) SOLN Inject into the skin daily. Insulin Concentrated (Humulin R-500) unit/ml    . lisinopril (PRINIVIL,ZESTRIL) 40 MG tablet Take 1 tablet (40 mg total) by mouth daily. 90 tablet 0  . methocarbamol (ROBAXIN) 500 MG tablet Take 1 tablet (500 mg total) by mouth every 6 (six) hours as needed for muscle spasms. 60 tablet 1  . metoprolol succinate (TOPROL-XL) 50 MG 24 hr tablet Take 1 tablet (50 mg total) by mouth daily. Take with or immediately following a meal. 90 tablet 3  . omeprazole (PRILOSEC) 20 MG capsule Take 1 capsule (20 mg total) by mouth daily. 90 capsule 3  . pravastatin (PRAVACHOL) 80 MG tablet Take 1 tablet (80 mg total) by mouth daily. 90 tablet 3  . tiZANidine (ZANAFLEX) 4 MG tablet Take 8 mg by mouth at bedtime.   1   No current facility-administered medications for this visit.    No Known Allergies   Review of Systems: All systems reviewed and negative except where noted in HPI.   Lab Results  Component Value Date   WBC 9.3 07/17/2016   HGB 14.8 07/17/2016   HCT 46.7 07/17/2016   MCV 91.0 07/17/2016   PLT 237 07/17/2016    Lab Results  Component Value Date   CREATININE 0.99 07/17/2016   BUN 15 07/17/2016   NA 135 07/17/2016   K 4.3 07/17/2016   CL 101 07/17/2016   CO2 27 07/17/2016    Lab Results  Component Value Date   ALT 38 12/31/2014   AST 35 12/31/2014   ALKPHOS 54 12/31/2014   BILITOT 0.5 12/31/2014     Physical  Exam: BP 112/64   Pulse 100 Comment: irregular  Ht 6' (1.829 m)   Wt 291 lb (132 kg)   BMI 39.47 kg/m  Constitutional: Pleasant,well-developed, male in no acute distress. HEENT: Normocephalic and atraumatic. Conjunctivae are normal. No scleral icterus. Neck supple.  Cardiovascular: Normal rate, regular rhythm.  Pulmonary/chest: Effort normal and breath sounds normal. No wheezing, rales or rhonchi. Abdominal: Soft, nondistended, nontender. There are no masses palpable.  No hepatomegaly. Extremities: no edema Lymphadenopathy: No cervical adenopathy noted. Neurological: Alert and oriented to person place and time. Skin: Skin is warm and dry. No rashes noted. Psychiatric: Normal mood and affect. Behavior is normal.   ASSESSMENT AND PLAN: 61 year old male here for a new patient visit for bowel habit changes. Has unpredictable urgency potentially related to dietary intake, along with loose stools, bothering him for at least a few months. Otherwise no other concerning alarm symptoms. He is taking more NSAIDs than usual recently, recommend he minimize the use if possible to see if this related. Given its been over 10 years since his last colonoscopy, and he warrants screening regardless of symptoms, recommend optical colonoscopy to further evaluate this perform his colon cancer screening. I discussed the risks and benefits of colonoscopy and anesthesia with him, and he wished to proceed. Further recommendations pending the results. He can use Imodium as needed in the interim.   Lankin Cellar, MD [facility] Pager [phone]  CC: [name], MD

## 2016-09-25 NOTE — Telephone Encounter (Signed)
Dear Dr. Chalmers Cater,     Dr. Havery Moros has scheduled the above patient for a colonoscopy at 3:30pm on 10-19-16.  Our records show that he/she is on insulin therapy via an insulin pump.  Our colonoscopy prep protocol requires that:  the patient must be on a clear liquid diet the entire day prior to the procedure date as well as the morning of the procedure  the patient must be NPO for 3 hours prior to the procedure   the patient must consume a PEG 3350 solution to prepare for the procedure.  Please advise [place] of any adjustments that need to be made to the patient's insulin pump therapy prior to the above procedure date.    Please route or fax back this completed form to me at [phone] .  If you have any question, please call me at [phone].  Thank you for your help with this matter.  Sincerely,   Tia Alert, [facility]  Physician Recommendation:  _______________________________________________________   _______________________________________________________   _______________________________________________________   _______________________________________________________

## 2016-10-02 NOTE — Telephone Encounter (Signed)
Called Dr. Almetta Lovely office and spoke to Lena.  Faxed a copy of request for insulin pump advisement for 10-19-16 colonoscopy to his attention at fax: [phone]

## 2016-10-03 NOTE — Telephone Encounter (Signed)
Received fax from Dr. Chalmers Cater regarding insulin pump therapy prior to colonoscopy on 10-19-16.  Directions for pt are as follows: "Temporary basal rate 50% on insulin pump starting at midnight before procedure till pt can take po".  Called and spoke to pt.  Relayed the instructions from Dr. Chalmers Cater.  Pt expressed complete understanding. Appreciated call.

## 2016-10-09 ENCOUNTER — Encounter: Payer: Self-pay | Admitting: Gastroenterology

## 2016-10-17 ENCOUNTER — Other Ambulatory Visit: Payer: Self-pay | Admitting: Cardiology

## 2016-10-18 ENCOUNTER — Telehealth: Payer: Self-pay | Admitting: Gastroenterology

## 2016-10-18 NOTE — Telephone Encounter (Signed)
Pt will call back to [facility] when he has his schedule available.

## 2016-10-18 NOTE — Telephone Encounter (Signed)
Due to weather & power outage is unable to come in for procedure tomorrow morning.

## 2016-10-18 NOTE — Telephone Encounter (Signed)
Okay he should reschedule at his convenience

## 2016-10-19 ENCOUNTER — Encounter: Payer: Medicare Other | Admitting: Gastroenterology

## 2016-11-15 ENCOUNTER — Other Ambulatory Visit: Payer: Self-pay | Admitting: Cardiology

## 2016-11-16 ENCOUNTER — Encounter: Payer: Medicare Other | Admitting: Gastroenterology

## 2016-12-19 ENCOUNTER — Encounter: Payer: Self-pay | Admitting: *Deleted

## 2016-12-19 ENCOUNTER — Ambulatory Visit: Payer: Medicare Other | Admitting: Cardiology

## 2016-12-19 VITALS — BP 124/74 | HR 98 | Ht 72.0 in | Wt 291.0 lb

## 2016-12-19 DIAGNOSIS — E782 Mixed hyperlipidemia: Secondary | ICD-10-CM | POA: Diagnosis not present

## 2016-12-19 DIAGNOSIS — Z87898 Personal history of other specified conditions: Secondary | ICD-10-CM | POA: Diagnosis not present

## 2016-12-19 DIAGNOSIS — I10 Essential (primary) hypertension: Secondary | ICD-10-CM

## 2016-12-19 DIAGNOSIS — E119 Type 2 diabetes mellitus without complications: Secondary | ICD-10-CM

## 2016-12-19 NOTE — Patient Instructions (Signed)

## 2016-12-19 NOTE — Progress Notes (Signed)
Cardiology Office Note  Date: 12/19/2016   ID: David Mcdowell, DOB 12-07-1955, MRN [id number]  PCP: [name], MD  Primary Cardiologist: Rozann Lesches, MD   Chief Complaint  Patient presents with  . Cardiac follow-up    History of Present Illness: David Mcdowell is a 61 y.o. male last seen in July.  He presents for a routine follow-up visit today.  Reports no chest pain or unusual shortness of breath with activity.  He underwent back surgery back in July (ALIF L4-5) and states that he has done very well, significant improvement in pain.  He has been able to be more active.  States that his blood sugars are starting to come down.  I reviewed his recent lab work which is detailed below.  He continues to follow with endocrinology.  Blood pressure is well controlled today.  He continues on chlorthalidone, lisinopril, and Toprol-XL.  Recent LDL was 74, he reports no problems with Pravachol.  Past Medical History:  Diagnosis Date  . Abscess   . Anxiety   . Arthritis   . Chronic back pain   . Essential hypertension, benign   . GERD (gastroesophageal reflux disease)   . History of chest pain    Negative ischemic workup over time  . History of kidney stones    spont. passed, post cytoscope    . Mixed hyperlipidemia   . Myositis   . Neuropathy   . Recurrent kidney stones   . Type 2 diabetes mellitus (HCC)    type I, on insulin pump    Past Surgical History:  Procedure Laterality Date  . ABDOMINAL EXPOSURE N/A 07/25/2016   Procedure: ABDOMINAL EXPOSURE;  Surgeon: Rosetta Posner, MD;  Location: [place];  Service: Vascular;  Laterality: N/A;  . ANTERIOR LUMBAR FUSION N/A 07/25/2016   Procedure: LUMBAR FOUR-FIVE ANTERIOR LUMBAR INTERBODY FUSION;  Surgeon: Eustace Moore, MD;  Location: [place];  Service: Neurosurgery;  Laterality: N/A;  . arthroscopies     both knees   . BACK SURGERY    . CARPAL TUNNEL RELEASE    . DORSAL COMPARTMENT RELEASE Left 12/22/2015   Procedure:  RELEASE DORSAL COMPARTMENT (DEQUERVAIN), left wrist;  Surgeon: [name], MD;  Location: [facility];  Service: Orthopedics;  Laterality: Left;  block  . ELBOW SURGERY     both tendon repairs( both elbows)  . INCISION AND DRAINAGE ABSCESS Right 02/06/2014   Procedure: INCISION AND DRAINAGE ABSCESS right groin abcess debridement skin and subqutaneous tissue;  Surgeon: Excell Seltzer, MD;  Location[facility];  Service: General;  Laterality: Right;  . STERIOD INJECTION Right 12/22/2015   Procedure: STEROID INJECTION;  Surgeon: [name], MD;  Location: [facility];  Service: Orthopedics;  Laterality: Right;  . TOTAL KNEE ARTHROPLASTY Left 01/12/2015   Procedure: LEFT TOTAL KNEE ARTHROPLASTY;  Surgeon: [name], MD;  Location: [place];  Service: Orthopedics;  Laterality: Left;    Current Outpatient Medications  Medication Sig Dispense Refill  . aspirin EC 81 MG tablet Take 81 mg by mouth daily.    . baclofen (LIORESAL) 10 MG tablet Take 10 mg by mouth 3 (three) times daily as needed.  0  . chlorthalidone (HYGROTON) 25 MG tablet TAKE 1/2 TABLET BY MOUTH DAILY. 45 tablet 3  . HUMULIN R 500 UNIT/ML injection 2 Units by Continuous infusion (non-IV) route continuous. 3 different basal rates 3 different bolus rates Use U-100 when hospitalized    .  HYDROmorphone (DILAUDID) 4 MG tablet Take 1 tablet (4 mg total) by mouth every 6 (six) hours as needed for severe pain. 4 mg every morning, and 8 mg at bedtime 30 tablet 0  . ibuprofen (ADVIL,MOTRIN) 200 MG tablet Take 800 mg by mouth every 8 (eight) hours as needed for mild pain or moderate pain. On hold for surgery- since 07/14/2016    . Insulin Human (INSULIN PUMP) SOLN Inject into the skin daily. Insulin Concentrated (Humulin R-500) unit/ml    . lisinopril (PRINIVIL,ZESTRIL) 40 MG tablet TAKE ONE TABLET BY MOUTH DAILY. 90 tablet 3  . metFORMIN (GLUCOPHAGE) 1000 MG tablet Take 1,000 mg by mouth 2 (two) times daily.  3    . metoprolol succinate (TOPROL-XL) 50 MG 24 hr tablet Take 1 tablet (50 mg total) by mouth daily. Take with or immediately following a meal. 90 tablet 3  . montelukast (SINGULAIR) 10 MG tablet Take 10 mg by mouth daily as needed.  2  . omeprazole (PRILOSEC) 20 MG capsule Take 1 capsule (20 mg total) by mouth daily. 90 capsule 3  . pravastatin (PRAVACHOL) 80 MG tablet Take 1 tablet (80 mg total) by mouth daily. 90 tablet 3  . SUPREP BOWEL PREP KIT 17.5-3.13-1.6 GM/180ML SOLN Suprep-Use as directed 354 mL 0  . tiZANidine (ZANAFLEX) 4 MG tablet Take 8 mg by mouth at bedtime.   1   No current facility-administered medications for this visit.    Allergies:  Patient has no known allergies.   Social History: The patient  reports that  has never smoked. he has never used smokeless tobacco. He reports that he does not drink alcohol or use drugs.   ROS:  Please see the history of present illness. Otherwise, complete review of systems is positive for peripheral neuropathy.  All other systems are reviewed and negative.   Physical Exam: VS:  BP 124/74   Pulse 98   Ht 6' (1.829 m)   Wt 291 lb (132 kg)   BMI 39.47 kg/m , BMI Body mass index is 39.47 kg/m.  Wt Readings from Last 3 Encounters:  12/19/16 291 lb (132 kg)  09/25/16 291 lb (132 kg)  07/25/16 287 lb (130.2 kg)    General: Obese male, appears comfortable at rest. HEENT: Conjunctiva and lids normal, oropharynx clear. Neck: Supple, no elevated JVP or carotid bruits, no thyromegaly. Lungs: Clear to auscultation, nonlabored breathing at rest. Cardiac: Regular rate and rhythm, no S3 or significant systolic murmur, no pericardial rub. Abdomen: Soft, nontender, bowel sounds present, no guarding or rebound. Extremities: No pitting edema, distal pulses 2+. Skin: Warm and dry. Musculoskeletal: No kyphosis. Neuropsychiatric: Alert and oriented x3, affect grossly appropriate.  ECG: I personally reviewed the tracing from 07/19/2016 which showed  sinus tachycardia with PVC and nonspecific T wave changes.  Recent Labwork: 07/17/2016: BUN 15; Creatinine, Ser 0.99; Hemoglobin 14.8; Platelets 237; Potassium 4.3; Sodium 135  December 2018: AST 28, ALT 29, cholesterol 150, triglycerides 74, HDL 61, LDL 74, hemoglobin A1c 8.4, potassium 4.3, BUN 12, creatinine 0.83  Other Studies Reviewed Today:  Lexiscan Cardiolite 08/06/2014:  The study is normal.  This is a low risk study.  The left ventricular ejection fraction is normal (55-65%).  There was no ST segment deviation noted during stress.  Normal resting and stress perfusion with no ischemia or infarction EF 65%  Echocardiogram 03/30/2013: Study Conclusions  - Left ventricle: The cavity size was normal. Systolic function was normal. The estimated ejection fraction was in the range  of 55% to 60%. Doppler parameters are consistent with abnormal left ventricular relaxation (grade 1 diastolic dysfunction). Mild to moderate left ventricular hypertrophy. - Aortic valve: Mildly thickened, mildly calcified leaflets. There was no stenosis. - Mitral valve: Calcified annulus. Mildly thickened leaflets. No significant regurgitation.  Assessment and Plan:  1.  History of chest pain with low risk noninvasive cardiac testing as of 2016.  He reports no new symptoms and plan is to continue with risk factor modification and observation.  2.  Type 2 diabetes mellitus, continues to follow with endocrinology.  Recent hemoglobin A1c 8.4%.  I discussed diet and basic exercise plan.  3.  Essential hypertension, blood pressure control is adequate today.  He continues on chlorthalidone, ACE inhibitor, and beta-blocker.  4.  Hyperlipidemia, on Pravachol.  Recent LDL 74.  Current medicines were reviewed with the patient today.  Disposition: Follow-up in 1 year.  Signed, Satira Sark, MD, [facility] 12/19/2016 11:15 AM    [place] [facility] [place], [place], [place] [address] Phone: [phone]; Fax: [phone]

## 2017-01-11 ENCOUNTER — Ambulatory Visit (AMBULATORY_SURGERY_CENTER): Payer: Medicare Other | Admitting: Gastroenterology

## 2017-01-11 ENCOUNTER — Encounter: Payer: Self-pay | Admitting: Gastroenterology

## 2017-01-11 VITALS — BP 142/83 | HR 101 | Temp 97.1°F | Resp 14 | Ht 72.0 in | Wt 291.0 lb

## 2017-01-11 DIAGNOSIS — R194 Change in bowel habit: Secondary | ICD-10-CM | POA: Diagnosis not present

## 2017-01-11 DIAGNOSIS — K635 Polyp of colon: Secondary | ICD-10-CM

## 2017-01-11 DIAGNOSIS — D129 Benign neoplasm of anus and anal canal: Secondary | ICD-10-CM

## 2017-01-11 DIAGNOSIS — D125 Benign neoplasm of sigmoid colon: Secondary | ICD-10-CM

## 2017-01-11 DIAGNOSIS — K621 Rectal polyp: Secondary | ICD-10-CM

## 2017-01-11 DIAGNOSIS — D128 Benign neoplasm of rectum: Secondary | ICD-10-CM

## 2017-01-11 MED ORDER — SODIUM CHLORIDE 0.9 % IV SOLN: 500.0000 mL | Freq: Once | INTRAVENOUS | Status: DC

## 2017-01-11 NOTE — Progress Notes (Signed)
Pt's states no medical or surgical changes since previsit or office visit. 

## 2017-01-11 NOTE — Patient Instructions (Signed)
Handout on polyps, diverticulosis, and hemorrhoids given to you today. Pathology results will be sent by letter in about two weeks.  Immodium as needed for loose stools    YOU HAD AN ENDOSCOPIC PROCEDURE TODAY AT [place] [facility]:   Refer to the procedure report that was given to you for any specific questions about what was found during the examination.  If the procedure report does not answer your questions, please call your gastroenterologist to clarify.  If you requested that your care partner not be given the details of your procedure findings, then the procedure report has been included in a sealed envelope for you to review at your convenience later.  YOU SHOULD EXPECT: Some feelings of bloating in the abdomen. Passage of more gas than usual.  Walking can help get rid of the air that was put into your GI tract during the procedure and reduce the bloating. If you had a lower endoscopy (such as a colonoscopy or flexible sigmoidoscopy) you may notice spotting of blood in your stool or on the toilet paper. If you underwent a bowel prep for your procedure, you may not have a normal bowel movement for a few days.  Please Note:  You might notice some irritation and congestion in your nose or some drainage.  This is from the oxygen used during your procedure.  There is no need for concern and it should clear up in a day or so.  SYMPTOMS TO REPORT IMMEDIATELY:   Following lower endoscopy (colonoscopy or flexible sigmoidoscopy):  Excessive amounts of blood in the stool  Significant tenderness or worsening of abdominal pains  Swelling of the abdomen that is new, acute  Fever of 100F or higher   For urgent or emergent issues, a gastroenterologist can be reached at any hour by calling [phone].   DIET:  We do recommend a small meal at first, but then you may proceed to your regular diet.  Drink plenty of fluids but you should avoid alcoholic beverages for 24 hours.  ACTIVITY:   You should plan to take it easy for the rest of today and you should NOT DRIVE or use heavy machinery until tomorrow (because of the sedation medicines used during the test).    FOLLOW UP: Our staff will call the number listed on your records the next business day following your procedure to check on you and address any questions or concerns that you may have regarding the information given to you following your procedure. If we do not reach you, we will leave a message.  However, if you are feeling well and you are not experiencing any problems, there is no need to return our call.  We will assume that you have returned to your regular daily activities without incident.  If any biopsies were taken you will be contacted by phone or by letter within the next 1-3 weeks.  Please call us at [phone] if you have not heard about the biopsies in 3 weeks.    SIGNATURES/CONFIDENTIALITY: You and/or your care partner have signed paperwork which will be entered into your electronic medical record.  These signatures attest to the fact that that the information above on your After Visit Summary has been reviewed and is understood.  Full responsibility of the confidentiality of this discharge information lies with you and/or your care-partner.

## 2017-01-11 NOTE — Progress Notes (Signed)
A and O x3. Report to RN. Tolerated MAC anesthesia well.

## 2017-01-11 NOTE — Op Note (Signed)
[facility] Patient Name: David Mcdowell Procedure Date: 01/11/2017 7:58 AM MRN: [id number] Endoscopist: Remo Lipps P. Yosgar Demirjian MD, MD Age: 62 Referring MD:  Date of Birth: 1955/01/23 Gender: Male Account #: [id number] Procedure:                Colonoscopy Indications:              Change in bowel habits Medicines:                Monitored Anesthesia Care Procedure:                Pre-Anesthesia Assessment:                           - Prior to the procedure, a History and Physical                            was performed, and patient medications and                            allergies were reviewed. The patient's tolerance of                            previous anesthesia was also reviewed. The risks                            and benefits of the procedure and the sedation                            options and risks were discussed with the patient.                            All questions were answered, and informed consent                            was obtained. Prior Anticoagulants: The patient has                            taken no previous anticoagulant or antiplatelet                            agents. ASA Grade Assessment: III - A patient with                            severe systemic disease. After reviewing the risks                            and benefits, the patient was deemed in                            satisfactory condition to undergo the procedure.                           After obtaining informed consent, the colonoscope  was passed under direct vision. Throughout the                            procedure, the patient's blood pressure, pulse, and                            oxygen saturations were monitored continuously. The                            Colonoscope was introduced through the anus and                            advanced to the the terminal ileum, with                            identification of the appendiceal orifice  and IC                            valve. The colonoscopy was performed without                            difficulty. The patient tolerated the procedure                            well. The quality of the bowel preparation was                            good. The terminal ileum, ileocecal valve,                            appendiceal orifice, and rectum were photographed. Scope In: 8:00:54 AM Scope Out: 8:16:46 AM Scope Withdrawal Time: 0 hours 14 minutes 2 seconds  Total Procedure Duration: 0 hours 15 minutes 52 seconds  Findings:                 The perianal and digital rectal examinations were                            normal.                           The terminal ileum appeared normal.                           A few small-mouthed diverticula were found in the                            sigmoid colon.                           A 3 mm polyp was found in the sigmoid colon. The                            polyp was sessile. The polyp was removed with a  cold biopsy forceps. Resection and retrieval were                            complete.                           A 3 mm polyp was found in the rectum. The polyp was                            sessile. The polyp was removed with a cold biopsy                            forceps. Resection and retrieval were complete.                           Internal hemorrhoids were found during retroflexion.                           The exam was otherwise without abnormality. No                            overt inflammatory changes seen.                           Biopsies for histology were taken with a cold                            forceps from the right colon, left colon and                            transverse colon for evaluation of microscopic                            colitis. Complications:            No immediate complications. Estimated blood loss:                            Minimal. Estimated Blood Loss:      Estimated blood loss was minimal. Impression:               - The examined portion of the ileum was normal.                           - Diverticulosis in the sigmoid colon.                           - One 3 mm polyp in the sigmoid colon, removed with                            a cold biopsy forceps. Resected and retrieved.                           - One 3 mm polyp in the rectum, removed with a cold  biopsy forceps. Resected and retrieved.                           - Internal hemorrhoids.                           - The examination was otherwise normal.                           - Biopsies were taken with a cold forceps from the                            right colon, left colon and transverse colon for                            evaluation of microscopic colitis. Recommendation:           - Patient has a contact number available for                            emergencies. The signs and symptoms of potential                            delayed complications were discussed with the                            patient. Return to normal activities tomorrow.                            Written discharge instructions were provided to the                            patient.                           - Resume previous diet.                           - Continue present medications.                           - Immodium as needed for loose stools                           - Await pathology results.                           - Repeat colonoscopy is recommended for                            surveillance. The colonoscopy date will be                            determined after pathology results from today's                            exam become available  for review. Remo Lipps P. Nobuko Gsell MD, MD 01/11/2017 8:21:31 AM This report has been signed electronically.

## 2017-01-11 NOTE — Progress Notes (Signed)
Called to room to assist during endoscopic procedure.  Patient ID and intended procedure confirmed with present staff. Received instructions for my participation in the procedure from the performing physician.  

## 2017-01-14 ENCOUNTER — Telehealth: Payer: Self-pay | Admitting: *Deleted

## 2017-01-14 NOTE — Telephone Encounter (Signed)
  Follow up Call-  Call back number 01/11/2017  Post procedure Call Back phone  # [phone]  Permission to leave phone message Yes  Some recent data might be hidden     Patient questions:  Do you have a fever, pain , or abdominal swelling? No. Pain Score  0 *  Have you tolerated food without any problems? Yes.    Have you been able to return to your normal activities? Yes.    Do you have any questions about your discharge instructions: Diet   No. Medications  No. Follow up visit  No.  Do you have questions or concerns about your Care? No.  Actions: * If pain score is 4 or above: No action needed, pain <4.

## 2017-01-18 ENCOUNTER — Encounter: Payer: Self-pay | Admitting: Gastroenterology

## 2017-01-31 ENCOUNTER — Other Ambulatory Visit: Payer: Self-pay | Admitting: Cardiology

## 2017-03-19 ENCOUNTER — Other Ambulatory Visit: Payer: Self-pay | Admitting: Neurological Surgery

## 2017-03-19 DIAGNOSIS — M5126 Other intervertebral disc displacement, lumbar region: Secondary | ICD-10-CM

## 2017-03-25 ENCOUNTER — Ambulatory Visit (HOSPITAL_COMMUNITY)
Admission: RE | Admit: 2017-03-25 | Discharge: 2017-03-25 | Disposition: A | Discharge status: Home / Self Care | Payer: Medicare Other | Source: Ambulatory Visit | Attending: Neurological Surgery | Admitting: Neurological Surgery

## 2017-03-25 DIAGNOSIS — M5127 Other intervertebral disc displacement, lumbosacral region: Secondary | ICD-10-CM | POA: Diagnosis not present

## 2017-03-25 DIAGNOSIS — M5126 Other intervertebral disc displacement, lumbar region: Secondary | ICD-10-CM | POA: Insufficient documentation

## 2017-05-29 DIAGNOSIS — M545 Low back pain, unspecified: Secondary | ICD-10-CM | POA: Insufficient documentation

## 2017-05-29 DIAGNOSIS — G8929 Other chronic pain: Secondary | ICD-10-CM | POA: Insufficient documentation

## 2017-06-11 ENCOUNTER — Other Ambulatory Visit: Payer: Self-pay | Admitting: Cardiology

## 2017-12-24 ENCOUNTER — Encounter: Payer: Self-pay | Admitting: *Deleted

## 2017-12-24 NOTE — Progress Notes (Signed)
Cardiology Office Note  Date: 12/25/2017   ID: David Mcdowell, DOB Aug 05, 1955, MRN [id number]  PCP: [name], MD  Primary Cardiologist: Rozann Lesches, MD   Chief Complaint  Patient presents with  . Cardiac follow-up    History of Present Illness: David Mcdowell is a 62 y.o. male last seen in December 2018.  He is here for a routine follow-up visit.  He does not report any exertional chest pain, palpitations, or progressive shortness of breath.  He still follows with endocrinology for management of type 1 diabetes mellitus on an insulin pump.  We went over his medications.  In terms of cardiac risk reduction he continues on aspirin, Pravachol, also on lisinopril and Toprol-XL for management of hypertension.  He has lost about 15 pounds since earlier in the year.  States that he is adjusting his medications with endocrinologist to obtain better blood sugar control.  His hemoglobin A1c was 9.5 recently.  I personally reviewed his ECG today which shows sinus rhythm with PVC and nonspecific T wave changes.  Past Medical History:  Diagnosis Date  . Abscess   . Anxiety   . Arthritis   . Chronic back pain   . Essential hypertension, benign   . GERD (gastroesophageal reflux disease)   . History of chest pain    Negative ischemic workup over time  . History of kidney stones   . Mixed hyperlipidemia   . Myositis   . Neuropathy   . Recurrent kidney stones   . Type 1 diabetes mellitus (HCC)    Insulin pump    Past Surgical History:  Procedure Laterality Date  . ABDOMINAL EXPOSURE N/A 07/25/2016   Procedure: ABDOMINAL EXPOSURE;  Surgeon: Rosetta Posner, MD;  Location: [facility];  Service: Vascular;  Laterality: N/A;  . ANTERIOR LUMBAR FUSION N/A 07/25/2016   Procedure: LUMBAR FOUR-FIVE ANTERIOR LUMBAR INTERBODY FUSION;  Surgeon: Eustace Moore, MD;  Location: [facility];  Service: Neurosurgery;  Laterality: N/A;  . arthroscopies     both knees   . BACK SURGERY    . CARPAL TUNNEL  RELEASE    . DORSAL COMPARTMENT RELEASE Left 12/22/2015   Procedure: RELEASE DORSAL COMPARTMENT (DEQUERVAIN), left wrist;  Surgeon: [name], MD;  Location: [facility];  Service: Orthopedics;  Laterality: Left;  block  . ELBOW SURGERY     both tendon repairs( both elbows)  . INCISION AND DRAINAGE ABSCESS Right 02/06/2014   Procedure: INCISION AND DRAINAGE ABSCESS right groin abcess debridement skin and subqutaneous tissue;  Surgeon: Excell Seltzer, MD;  Location: [facility];  Service: General;  Laterality: Right;  . STERIOD INJECTION Right 12/22/2015   Procedure: STEROID INJECTION;  Surgeon: [name], MD;  Location: [facility];  Service: Orthopedics;  Laterality: Right;  . TOTAL KNEE ARTHROPLASTY Left 01/12/2015   Procedure: LEFT TOTAL KNEE ARTHROPLASTY;  Surgeon: [name], MD;  Location: [facility];  Service: Orthopedics;  Laterality: Left;    Current Outpatient Medications  Medication Sig Dispense Refill  . aspirin EC 81 MG tablet Take 81 mg by mouth daily.    . baclofen (LIORESAL) 10 MG tablet Take 10 mg by mouth 3 (three) times daily as needed.  0  . gabapentin (NEURONTIN) 300 MG capsule 300 mg. Takes one tab every morning & 2 tabs at bedtime    . HUMULIN R 500 UNIT/ML injection 2 Units by Continuous infusion (non-IV) route continuous. 3 different basal rates 3 different bolus  rates Use U-100 when hospitalized    . HYDROmorphone (DILAUDID) 2 MG tablet Take 4 mg by mouth at bedtime.    Marland Kitchen ibuprofen (ADVIL,MOTRIN) 200 MG tablet Take 800 mg by mouth every 8 (eight) hours as needed for mild pain or moderate pain. On hold for surgery- since 07/14/2016    . Insulin Human (INSULIN PUMP) SOLN Inject into the skin daily. Insulin Concentrated (Humulin R-500) unit/ml    . lisinopril (PRINIVIL,ZESTRIL) 40 MG tablet TAKE ONE TABLET BY MOUTH DAILY. 90 tablet 3  . metFORMIN (GLUCOPHAGE) 1000 MG tablet Take 1,000 mg by mouth 2 (two) times daily.  3  . metoprolol  succinate (TOPROL-XL) 50 MG 24 hr tablet TAKE ONE TABLET BY MOUTH DAILY. TAKE WITH OR IMMEDIATELY FOLLOWING A MEAL. 90 tablet 3  . montelukast (SINGULAIR) 10 MG tablet Take 10 mg by mouth daily as needed.  2  . omeprazole (PRILOSEC) 20 MG capsule TAKE ONE CAPSULE BY MOUTH DAILY. 90 capsule 3  . pravastatin (PRAVACHOL) 80 MG tablet TAKE ONE TABLET BY MOUTH DAILY. 90 tablet 3   No current facility-administered medications for this visit.    Allergies:  Patient has no known allergies.   Social History: The patient  reports that he has never smoked. He has never used smokeless tobacco. He reports that he does not drink alcohol or use drugs.   ROS:  Please see the history of present illness. Otherwise, complete review of systems is positive for chronic back pain.  All other systems are reviewed and negative.   Physical Exam: VS:  BP (!) 142/79   Pulse 90   Ht 6' (1.829 m)   Wt 274 lb 12.8 oz (124.6 kg)   SpO2 99%   BMI 37.27 kg/m , BMI Body mass index is 37.27 kg/m.  Wt Readings from Last 3 Encounters:  12/25/17 274 lb 12.8 oz (124.6 kg)  01/11/17 291 lb (132 kg)  12/19/16 291 lb (132 kg)    General: Obese male, appears comfortable at rest. HEENT: Conjunctiva and lids normal, oropharynx clear. Neck: Supple, no elevated JVP or carotid bruits, no thyromegaly. Lungs: Clear to auscultation, nonlabored breathing at rest. Cardiac: Regular rate and rhythm, no S3 or significant systolic murmur. Abdomen: Soft, nontender, bowel sounds present. Extremities: No pitting edema, distal pulses 2+. Skin: Warm and dry. Musculoskeletal: No kyphosis. Neuropsychiatric: Alert and oriented x3, affect grossly appropriate.  ECG: I personally reviewed the tracing from 07/19/2016 which showed sinus tachycardia with PVC and nonspecific T wave changes.  Recent Labwork:  December 2018: AST 28, ALT 29, cholesterol 150, triglycerides 74, HDL 61, LDL 74, hemoglobin A1c 8.4, potassium 4.3, BUN 12, creatinine  0.83 December 2019: Hemoglobin A1c 9.5, hemoglobin 14.1, platelets 190, glucose 134, potassium 4.8, BUN 9, creatinine 0.79, AST 29, ALT 24, cholesterol 131, triglycerides 107, HDL 45, LDL 65, PSA 0.4  Other Studies Reviewed Today:  Lexiscan Cardiolite 08/06/2014:  The study is normal.  This is a low risk study.  The left ventricular ejection fraction is normal (55-65%).  There was no ST segment deviation noted during stress.  Normal resting and stress perfusion with no ischemia or infarction EF 65%  Echocardiogram 03/30/2013: Study Conclusions  - Left ventricle: The cavity size was normal. Systolic function was normal. The estimated ejection fraction was in the range of 55% to 60%. Doppler parameters are consistent with abnormal left ventricular relaxation (grade 1 diastolic dysfunction). Mild to moderate left ventricular hypertrophy. - Aortic valve: Mildly thickened, mildly calcified leaflets. There was  no stenosis. - Mitral valve: Calcified annulus. Mildly thickened leaflets. No significant regurgitation.  Assessment and Plan:  1.  History of chest pain without recurrence.  Follow-up ECG reviewed.  Continue risk factor modification.  He remains on aspirin and statin.  I continue to recommend weight loss and basic walking plan for exercise.  2.  Mixed hyperlipidemia, on Pravachol with recent LDL 65.  3.  Uncontrolled type 1 diabetes.  He is on insulin pump with additional oral therapies.  Recent hemoglobin A1c 9.5.  Keep follow-up with endocrinology.  4.  Essential hypertension, blood pressure is mildly elevated today.  Continue with current medical treatment, diet and weight loss recommended.  Current medicines were reviewed with the patient today.   Orders Placed This Encounter  Procedures  . EKG 12-Lead    Disposition: Follow-up in 1 year.  Signed, Satira Sark, MD, [facility] 12/25/2017 9:09 AM    [facility] [place], [place], [place] [address] Phone: [phone]; Fax: [phone]

## 2017-12-25 ENCOUNTER — Ambulatory Visit: Payer: Medicare Other | Admitting: Cardiology

## 2017-12-25 ENCOUNTER — Encounter: Payer: Self-pay | Admitting: Cardiology

## 2017-12-25 VITALS — BP 142/79 | HR 90 | Ht 72.0 in | Wt 274.8 lb

## 2017-12-25 DIAGNOSIS — E782 Mixed hyperlipidemia: Secondary | ICD-10-CM

## 2017-12-25 DIAGNOSIS — I10 Essential (primary) hypertension: Secondary | ICD-10-CM

## 2017-12-25 DIAGNOSIS — E1065 Type 1 diabetes mellitus with hyperglycemia: Secondary | ICD-10-CM | POA: Diagnosis not present

## 2017-12-25 DIAGNOSIS — Z87898 Personal history of other specified conditions: Secondary | ICD-10-CM | POA: Diagnosis not present

## 2017-12-25 NOTE — Patient Instructions (Addendum)

## 2017-12-26 ENCOUNTER — Other Ambulatory Visit: Payer: Self-pay | Admitting: Cardiology

## 2018-02-11 ENCOUNTER — Other Ambulatory Visit: Payer: Self-pay | Admitting: Cardiology

## 2018-07-05 ENCOUNTER — Other Ambulatory Visit: Payer: Self-pay | Admitting: Cardiology

## 2018-07-14 ENCOUNTER — Other Ambulatory Visit: Payer: Self-pay | Admitting: Cardiology

## 2018-07-28 ENCOUNTER — Telehealth: Payer: Self-pay | Admitting: *Deleted

## 2018-07-28 NOTE — Telephone Encounter (Signed)
Pt c/o swelling in the feet/legs more in the left leg/foot with pain/tenderness for the last 2 weeks - denies any discoloration/redness/oozing - denies any weight gain/SOB/dizziness/CP - doesn't know what BP has been - says HR stays in the 90s

## 2018-07-28 NOTE — Telephone Encounter (Signed)
Pt voiced understanding and would contact pcp

## 2018-07-28 NOTE — Telephone Encounter (Signed)
Would start by having him see his PCP for assessment as this could be due to a number of things, not all cardiac.

## 2018-10-05 DIAGNOSIS — R112 Nausea with vomiting, unspecified: Secondary | ICD-10-CM | POA: Diagnosis not present

## 2018-10-05 DIAGNOSIS — E109 Type 1 diabetes mellitus without complications: Secondary | ICD-10-CM | POA: Insufficient documentation

## 2018-10-05 DIAGNOSIS — Z7982 Long term (current) use of aspirin: Secondary | ICD-10-CM | POA: Diagnosis not present

## 2018-10-05 DIAGNOSIS — Z9641 Presence of insulin pump (external) (internal): Secondary | ICD-10-CM | POA: Insufficient documentation

## 2018-10-05 DIAGNOSIS — I10 Essential (primary) hypertension: Secondary | ICD-10-CM | POA: Diagnosis not present

## 2018-10-05 DIAGNOSIS — Z79899 Other long term (current) drug therapy: Secondary | ICD-10-CM | POA: Insufficient documentation

## 2018-10-05 DIAGNOSIS — K529 Noninfective gastroenteritis and colitis, unspecified: Secondary | ICD-10-CM | POA: Insufficient documentation

## 2018-10-05 DIAGNOSIS — R1110 Vomiting, unspecified: Secondary | ICD-10-CM | POA: Diagnosis present

## 2018-10-06 ENCOUNTER — Encounter (HOSPITAL_COMMUNITY): Payer: Self-pay

## 2018-10-06 ENCOUNTER — Emergency Department (HOSPITAL_COMMUNITY)
Admission: EM | Admit: 2018-10-06 | Discharge: 2018-10-06 | Disposition: A | Discharge status: Home / Self Care | Payer: Medicare Other | Attending: Emergency Medicine | Admitting: Emergency Medicine

## 2018-10-06 ENCOUNTER — Emergency Department (HOSPITAL_COMMUNITY): Payer: Medicare Other

## 2018-10-06 ENCOUNTER — Other Ambulatory Visit: Payer: Self-pay

## 2018-10-06 DIAGNOSIS — R112 Nausea with vomiting, unspecified: Secondary | ICD-10-CM

## 2018-10-06 DIAGNOSIS — R197 Diarrhea, unspecified: Secondary | ICD-10-CM

## 2018-10-06 DIAGNOSIS — K529 Noninfective gastroenteritis and colitis, unspecified: Secondary | ICD-10-CM

## 2018-10-06 LAB — COMPREHENSIVE METABOLIC PANEL
ALT: 31 U/L (ref 0–44)
AST: 31 U/L (ref 15–41)
Albumin: 4 g/dL (ref 3.5–5.0)
Alkaline Phosphatase: 73 U/L (ref 38–126)
Anion gap: 13 (ref 5–15)
BUN: 14 mg/dL (ref 8–23)
CO2: 21 mmol/L — ABNORMAL LOW (ref 22–32)
Calcium: 8.7 mg/dL — ABNORMAL LOW (ref 8.9–10.3)
Chloride: 105 mmol/L (ref 98–111)
Creatinine, Ser: 1.03 mg/dL (ref 0.61–1.24)
GFR calc Af Amer: >60 mL/min (ref >60)
GFR calc non Af Amer: >60 mL/min (ref >60)
Glucose, Bld: 244 mg/dL — ABNORMAL HIGH (ref 70–99)
Potassium: 4.8 mmol/L (ref 3.5–5.1)
Sodium: 139 mmol/L (ref 135–145)
Total Bilirubin: 1.2 mg/dL (ref 0.3–1.2)
Total Protein: 7.4 g/dL (ref 6.5–8.1)

## 2018-10-06 LAB — CBC WITH DIFFERENTIAL/PLATELET
Abs Immature Granulocytes: 0.09 10*3/uL — ABNORMAL HIGH (ref 0.00–0.07)
Basophils Absolute: 0 10*3/uL (ref 0.0–0.1)
Basophils Relative: 0 %
Eosinophils Absolute: 0 10*3/uL (ref 0.0–0.5)
Eosinophils Relative: 0 %
HCT: 49.2 % (ref 39.0–52.0)
Hemoglobin: 14.9 g/dL (ref 13.0–17.0)
Immature Granulocytes: 1 %
Lymphocytes Relative: 6 %
Lymphs Abs: 1 10*3/uL (ref 0.7–4.0)
MCH: 28.8 pg (ref 26.0–34.0)
MCHC: 30.3 g/dL (ref 30.0–36.0)
MCV: 95.2 fL (ref 80.0–100.0)
Monocytes Absolute: 1.1 10*3/uL — ABNORMAL HIGH (ref 0.1–1.0)
Monocytes Relative: 6 %
Neutro Abs: 15.2 10*3/uL — ABNORMAL HIGH (ref 1.7–7.7)
Neutrophils Relative %: 87 %
Platelets: 309 10*3/uL (ref 150–400)
RBC: 5.17 MIL/uL (ref 4.22–5.81)
RDW: 13.9 % (ref 11.5–15.5)
WBC: 17.4 10*3/uL — ABNORMAL HIGH (ref 4.0–10.5)
nRBC: 0 % (ref 0.0–0.2)

## 2018-10-06 LAB — LIPASE, BLOOD: Lipase: 33 U/L (ref 11–51)

## 2018-10-06 MED ORDER — ONDANSETRON HCL 4 MG PO TABS: 4.0000 mg | ORAL_TABLET | Freq: Four times a day (QID) | ORAL | 0 refills | Status: DC | PRN

## 2018-10-06 MED ORDER — IOHEXOL 300 MG/ML  SOLN
100.0000 mL | Freq: Once | INTRAMUSCULAR | Status: AC | PRN
  Administered 2018-10-06: 03:00:00 100 mL via INTRAVENOUS

## 2018-10-06 MED ORDER — DIPHENOXYLATE-ATROPINE 2.5-0.025 MG PO TABS: 1.0000 | ORAL_TABLET | Freq: Four times a day (QID) | ORAL | 0 refills | Status: DC | PRN

## 2018-10-06 MED ORDER — HYOSCYAMINE SULFATE 0.125 MG SL SUBL: 0.1250 mg | SUBLINGUAL_TABLET | SUBLINGUAL | 0 refills | Status: DC | PRN

## 2018-10-06 MED ORDER — DIPHENHYDRAMINE HCL 50 MG/ML IJ SOLN
25.0000 mg | Freq: Once | INTRAMUSCULAR | Status: AC
  Administered 2018-10-06: 05:00:00 25 mg via INTRAVENOUS
  Filled 2018-10-06: qty 1

## 2018-10-06 MED ORDER — METOCLOPRAMIDE HCL 5 MG/ML IJ SOLN
10.0000 mg | Freq: Once | INTRAMUSCULAR | Status: AC
  Administered 2018-10-06: 05:00:00 10 mg via INTRAVENOUS
  Filled 2018-10-06: qty 2

## 2018-10-06 MED ORDER — ONDANSETRON HCL 4 MG/2ML IJ SOLN
4.0000 mg | Freq: Once | INTRAMUSCULAR | Status: AC
  Administered 2018-10-06: 4 mg via INTRAVENOUS
  Filled 2018-10-06: qty 2

## 2018-10-06 MED ORDER — SODIUM CHLORIDE 0.9 % IV BOLUS
1000.0000 mL | Freq: Once | INTRAVENOUS | Status: AC
  Administered 2018-10-06: 1000 mL via INTRAVENOUS

## 2018-10-06 NOTE — ED Triage Notes (Signed)
Pt reports vomiting and diarrhea that started about 5 hours ago. Denies being around anyone with similar symptoms. PT reports abdominal pain, bloating, and belching. Reports some relief after belching.

## 2018-10-06 NOTE — ED Provider Notes (Signed)
[facility] EMERGENCY DEPARTMENT Provider Note   CSN: [id number] Arrival date & time: 10/05/18  2359     History   Chief Complaint Chief Complaint  Patient presents with  . Emesis    HPI David Mcdowell is a 63 y.o. male.     Patient presents to the emergency department for evaluation of abdominal pain with nausea, vomiting, diarrhea.  Symptoms began approximately 5 hours ago.  Patient reports that he has had continuous vomiting, cannot hold anything down.  His abdomen has felt distended.  Vomit has been sour and foul-smelling.  He has not had a fever.  Patient reports that he has felt weaker and weaker over time, feels dehydrated.     Past Medical History:  Diagnosis Date  . Abscess   . Anxiety   . Arthritis   . Chronic back pain   . Essential hypertension, benign   . GERD (gastroesophageal reflux disease)   . History of chest pain    Negative ischemic workup over time  . History of kidney stones   . Mixed hyperlipidemia   . Myositis   . Neuropathy   . Recurrent kidney stones   . Type 1 diabetes mellitus (HCC)    Insulin pump    Patient Active Problem List   Diagnosis Date Noted  . S/P lumbar spinal fusion 07/25/2016  . DJD (degenerative joint disease) of knee 01/12/2015  . Groin abscess 02/06/2014  . Abscess of groin, right 02/06/2014  . 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 Cyst of buttocks 11/25/2012  . Mixed hyperlipidemia 07/08/2012  . Dyspepsia 07/08/2012  . Precordial pain 05/04/2010  . Type 2 diabetes mellitus, uncontrolled, with neuropathy ([place]) 05/04/2010  . Essential hypertension, benign 05/04/2010    Past Surgical History:  Procedure Laterality Date  . ABDOMINAL EXPOSURE N/A 07/25/2016   Procedure: ABDOMINAL EXPOSURE;  Surgeon: Rosetta Posner, MD;  Location: [place];  Service: Vascular;  Laterality: N/A;  . ANTERIOR LUMBAR FUSION N/A 07/25/2016   Procedure: LUMBAR FOUR-FIVE ANTERIOR LUMBAR INTERBODY FUSION;  Surgeon: Eustace Moore, MD;  Location: [place];   Service: Neurosurgery;  Laterality: N/A;  . arthroscopies     both knees   . BACK SURGERY    . CARPAL TUNNEL RELEASE    . DORSAL COMPARTMENT RELEASE Left 12/22/2015   Procedure: RELEASE DORSAL COMPARTMENT (DEQUERVAIN), left wrist;  Surgeon: [name], MD;  Location: [place];  Service: Orthopedics;  Laterality: Left;  block  . ELBOW SURGERY     both tendon repairs( both elbows)  . INCISION AND DRAINAGE ABSCESS Right 02/06/2014   Procedure: INCISION AND DRAINAGE ABSCESS right groin abcess debridement skin and subqutaneous tissue;  Surgeon: Excell Seltzer, MD;  Location: [facility];  Service: General;  Laterality: Right;  . STERIOD INJECTION Right 12/22/2015   Procedure: STEROID INJECTION;  Surgeon: [name], MD;  Location: [place];  Service: Orthopedics;  Laterality: Right;  . TOTAL KNEE ARTHROPLASTY Left 01/12/2015   Procedure: LEFT TOTAL KNEE ARTHROPLASTY;  Surgeon: [name], MD;  Location: [place];  Service: Orthopedics;  Laterality: Left;        Home Medications    Prior to Admission medications   Medication Sig Start Date End Date Taking? Authorizing Provider  aspirin EC 81 MG tablet Take 81 mg by mouth daily.    [provider]  baclofen (LIORESAL) 10 MG tablet Take 10 mg by mouth 3 (three) times daily as needed. 11/06/16   [provider]  diphenoxylate-atropine (LOMOTIL) 2.5-0.025 MG tablet Take 1-2 tablets by mouth 4 (four) times daily as needed for diarrhea or loose stools. 10/06/18   Orpah Greek, MD  gabapentin (NEURONTIN) 300 MG capsule 300 mg. Takes one tab every morning & 2 tabs at bedtime    [provider]  HUMULIN R 500 UNIT/ML injection 2 Units by Continuous infusion (non-IV) route continuous. 3 different basal rates 3 different bolus rates Use U-100 when hospitalized 11/09/14   [provider]  HYDROmorphone (DILAUDID) 2 MG tablet Take 4 mg by mouth at bedtime.    [provider]  hyoscyamine (LEVSIN SL) 0.125 MG SL tablet Place 1 tablet (0.125 mg total) under the tongue every 4 (four) hours as needed for cramping (abdominal pain). 10/06/18   Orpah Greek, MD  ibuprofen (ADVIL,MOTRIN) 200 MG tablet Take 800 mg by mouth every 8 (eight) hours as needed for mild pain or moderate pain. On hold for surgery- since 07/14/2016    [provider]  Insulin Human (INSULIN PUMP) SOLN Inject into the skin daily. Insulin Concentrated (Humulin R-500) unit/ml    [provider]  lisinopril (PRINIVIL,ZESTRIL) 40 MG tablet TAKE ONE TABLET BY MOUTH DAILY. 12/26/17   Satira Sark, MD  metFORMIN (GLUCOPHAGE) 1000 MG tablet Take 1,000 mg by mouth 2 (two) times daily. 11/15/16   [provider]  metoprolol succinate (TOPROL-XL) 50 MG 24 hr tablet TAKE ONE TABLET BY MOUTH DAILY. TAKE WITH OR IMMEDIATELY FOLLOWING A MEAL. 02/11/18   Satira Sark, MD  montelukast (SINGULAIR) 10 MG tablet Take 10 mg by mouth daily as needed. 09/25/16   [provider]  omeprazole (PRILOSEC) 20 MG capsule TAKE ONE CAPSULE BY MOUTH DAILY. 07/07/18   Satira Sark, MD  ondansetron (ZOFRAN) 4 MG tablet Take 1 tablet (4 mg total) by mouth every 6 (six) hours as needed for nausea or vomiting. 10/06/18   , Gwenyth Allegra, MD  pravastatin (PRAVACHOL) 80 MG tablet TAKE ONE TABLET BY MOUTH DAILY. 07/15/18   Satira Sark, MD    Family History Family History  Problem Relation Age of Onset  . Asthma Mother   . Heart disease Father   . Diabetes Father   . Asthma Sister   . Healthy Brother   . Coronary artery disease Other   . Leukemia Paternal Grandfather   . Cirrhosis Maternal Aunt   . Colon cancer Neg Hx   . Rectal cancer Neg Hx   . Esophageal cancer Neg Hx   . Stomach cancer Neg Hx   . Liver cancer Neg Hx     Social History Social History   Tobacco Use  . Smoking status: Never Smoker  . Smokeless tobacco: Never Used  Substance Use  Topics  . Alcohol use: No    Alcohol/week: 0.0 standard drinks  . Drug use: No     Allergies   Patient has no known allergies.   Review of Systems Review of Systems  Gastrointestinal: Positive for abdominal distention, abdominal pain, diarrhea, nausea and vomiting.  All other systems reviewed and are negative.    Physical Exam Updated Vital Signs BP 128/75   Pulse (!) 120   Temp 98 F (36.7 C)   Resp 18   Ht 6' (1.829 m)   Wt 124.7 kg   SpO2 93%   BMI 37.30 kg/m   Physical Exam Vitals signs and nursing note reviewed.  Constitutional:      General: He is  not in acute distress.    Appearance: Normal appearance. He is well-developed.  HENT:     Head: Normocephalic and atraumatic.     Right Ear: Hearing normal.     Left Ear: Hearing normal.     Nose: Nose normal.  Eyes:     Conjunctiva/sclera: Conjunctivae normal.     Pupils: Pupils are equal, round, and reactive to light.  Neck:     Musculoskeletal: Normal range of motion and neck supple.  Cardiovascular:     Rate and Rhythm: Regular rhythm.     Heart sounds: S1 normal and S2 normal. No murmur. No friction rub. No gallop.   Pulmonary:     Effort: Pulmonary effort is normal. No respiratory distress.     Breath sounds: Normal breath sounds.  Chest:     Chest wall: No tenderness.  Abdominal:     General: Bowel sounds are normal. There is distension.     Tenderness: There is generalized abdominal tenderness. There is no guarding or rebound. Negative signs include Murphy's sign and McBurney's sign.     Hernia: No hernia is present.  Musculoskeletal: Normal range of motion.  Skin:    General: Skin is warm and dry.     Findings: No rash.  Neurological:     Mental Status: He is alert and oriented to person, place, and time.     GCS: GCS eye subscore is 4. GCS verbal subscore is 5. GCS motor subscore is 6.     Cranial Nerves: No cranial nerve deficit.     Sensory: No sensory deficit.     Coordination:  Coordination normal.  Psychiatric:        Speech: Speech normal.        Behavior: Behavior normal.        Thought Content: Thought content normal.      ED Treatments / Results  Labs (all labs ordered are listed, but only abnormal results are displayed) Labs Reviewed  CBC WITH DIFFERENTIAL/PLATELET - Abnormal; Notable for the following components:      Result Value   WBC 17.4 (*)    Neutro Abs 15.2 (*)    Monocytes Absolute 1.1 (*)    Abs Immature Granulocytes 0.09 (*)    All other components within normal limits  COMPREHENSIVE METABOLIC PANEL - Abnormal; Notable for the following components:   CO2 21 (*)    Glucose, Bld 244 (*)    Calcium 8.7 (*)    All other components within normal limits  LIPASE, BLOOD    EKG None  Radiology Ct Abdomen Pelvis W Contrast  Result Date: 10/06/2018 CLINICAL DATA:  Acute abdominal pain. Abdominal distension. Vomiting and diarrhea. Technologist note: Patient wearing continuous glucose monitor called dexcomg6. Website states CT scan is contraindication. Due to patient sickness patient agreed to have CT scan and contact company at later date. Discussed this with Radiologist Dr Marguerita Merles and [facility] Na ER Dr Betsey Holiday @ [phone] on 10/06/2018.Dr. Betsey Holiday agreed patient was ok to have CT scan and disregard further readings from device. Patient also informed and patient to manually check blood sugar, patient agreed to have CT scan and knows not to rely on device for further glucose readings until new unit. EXAM: CT ABDOMEN AND PELVIS WITH CONTRAST TECHNIQUE: Multidetector CT imaging of the abdomen and pelvis was performed using the standard protocol following bolus administration of intravenous contrast. CONTRAST:  174mL OMNIPAQUE IOHEXOL 300 MG/ML  SOLN COMPARISON:  None. FINDINGS: Lower chest: Elevated right hemidiaphragm. Complete compressive  atelectasis of the right middle lobe, with lesser compressive atelectasis of the right lower lobe. Hepatobiliary: Mild  hepatic steatosis without focal hepatic abnormality. Gallbladder physiologically distended, no calcified stone. No biliary dilatation. Pancreas: No ductal dilatation or inflammation. Spleen: Normal in size without focal abnormality. Adrenals/Urinary Tract: Normal adrenal glands. No hydronephrosis or perinephric edema. Homogeneous renal enhancement. Urinary bladder is physiologically distended without wall thickening. Stomach/Bowel: Stomach is distended. No gastric wall thickening. Proximal small bowel and prominent, no discrete transition point. There is gradual transition from dilated to nondilated small bowel. Normal appendix. Majority of the colon is decompressed and not well assessed. No colonic wall thickening or inflammatory change. Vascular/Lymphatic: 13 mm right lower quadrant mesenteric lymph node series 2, image 67. Scattered prominent retroperitoneal nodes, largest aortocaval the level of the left renal artery image 51. Few prominent periportal and portacaval nodes. Minor aortic atherosclerosis. No aneurysm. Reproductive: Prostate is unremarkable. Vas deferens calcifications seen with diabetes. Other: No free air, free fluid, or intra-abdominal fluid collection. Minimal scarring of the left lower quadrant anterior abdominal wall. Minimal fat in the right inguinal canal. Musculoskeletal: Degenerative and postsurgical change in the spine. There are no acute or suspicious osseous abnormalities. IMPRESSION: 1. Dilated stomach with fluid-filled prominent proximal and mid small bowel. There is no discrete transition point or frank obstruction. Findings may represent enteritis or ileus. 2. Mild hepatic steatosis. 3. Elevated right hemidiaphragm with complete compressive atelectasis of the right middle lobe and lesser compressive atelectasis of the right lower lobe. 4. Single prominent right lower quadrant mesenteric node, as well as prominent retroperitoneal nodes are nonspecific, but likely reactive. Aortic  Atherosclerosis (ICD10-I70.0). Electronically Signed   By: Keith Rake M.D.   On: 10/06/2018 03:33    Procedures Procedures (including critical care time)  Medications Ordered in ED Medications  sodium chloride 0.9 % bolus 1,000 mL (1,000 mLs Intravenous New Bag/Given 10/06/18 0121)  ondansetron (ZOFRAN) injection 4 mg (4 mg Intravenous Given 10/06/18 0121)  iohexol (OMNIPAQUE) 300 MG/ML solution 100 mL (100 mLs Intravenous Contrast Given 10/06/18 0238)     Initial Impression / Assessment and Plan / ED Course  I have reviewed the triage vital signs and the nursing notes.  Pertinent labs & imaging results that were available during my care of the patient were reviewed by me and considered in my medical decision making (see chart for details).        Patient presents to the emergency department for evaluation of acute onset nausea, vomiting, diarrhea with abdominal distention and pain.  Symptoms present for 5 hours on arrival to the ER.  Exam revealed distention with diffuse tenderness, no focality or signs of acute peritonitis.  He did have a significant leukocytosis.  Remainder of lab work was unremarkable other than very slight elevation of glucose at 244.  Patient therefore underwent CT scan.  This shows distention of the stomach and small bowel without a transition point, ileus versus enteritis.  He does not have a history of abdominal surgeries and small bowel obstruction is not felt to be likely.  Will continue symptomatic treatment, as work-up has been nonrevealing, patient will be managed as an outpatient.  Final Clinical Impressions(s) / ED Diagnoses   Final diagnoses:  Nausea vomiting and diarrhea  Gastroenteritis    ED Discharge Orders         Ordered    ondansetron (ZOFRAN) 4 MG tablet  Every 6 hours PRN     10/06/18 0346    diphenoxylate-atropine (LOMOTIL) 2.5-0.025 MG  tablet  4 times daily PRN     10/06/18 0346    hyoscyamine (LEVSIN SL) 0.125 MG SL tablet  Every  4 hours PRN     10/06/18 0346           Orpah Greek, MD 10/06/18 [phone]

## 2018-10-20 ENCOUNTER — Telehealth: Payer: Self-pay | Admitting: Cardiology

## 2018-10-20 NOTE — Telephone Encounter (Signed)
Reports SOB that has gradually gotten worse over the past 6 months, bilateral edema that occurs throughout the day that improves with elevation. Reports sweating a lot. Reports mid back pain that he thinks is arthritis pain and he sees a pain management for this problem.  Patient says he has discussed his symptoms with his daughter who is an interventional radiologist nurse and she suggest that he speaks with his cardiologist about having a cardiac ct. Denies dizziness or chest pain. Virtual visit arranged for patient on 11/05/18 @8 :40 am with Domenic Polite. Advised that if his symptoms get worse, to go to the ED for an evaluation. Verbalized understanding of plan.  Patient verbally consented for telehealth visits with [facility] and understands that her insurance company will be billed for the encounter.   Will try to have vitals available.

## 2018-10-20 NOTE — Telephone Encounter (Signed)
Feels he needs to be seen sooner in office due to having issues with stamina and being SOB stated that he sweats a lot more than he normally does

## 2018-11-04 ENCOUNTER — Other Ambulatory Visit: Payer: Self-pay

## 2018-11-04 ENCOUNTER — Ambulatory Visit: Payer: Medicare Other | Admitting: Gastroenterology

## 2018-11-04 ENCOUNTER — Encounter: Payer: Self-pay | Admitting: Cardiology

## 2018-11-04 ENCOUNTER — Encounter: Payer: Self-pay | Admitting: Gastroenterology

## 2018-11-04 VITALS — BP 120/70 | HR 80 | Temp 98.1°F | Ht 72.0 in | Wt 285.0 lb

## 2018-11-04 DIAGNOSIS — R112 Nausea with vomiting, unspecified: Secondary | ICD-10-CM

## 2018-11-04 DIAGNOSIS — R194 Change in bowel habit: Secondary | ICD-10-CM | POA: Diagnosis not present

## 2018-11-04 DIAGNOSIS — Z1159 Encounter for screening for other viral diseases: Secondary | ICD-10-CM | POA: Diagnosis not present

## 2018-11-04 DIAGNOSIS — R6881 Early satiety: Secondary | ICD-10-CM

## 2018-11-04 MED ORDER — COLESTIPOL HCL 1 G PO TABS: 1.0000 g | ORAL_TABLET | Freq: Two times a day (BID) | ORAL | 3 refills | Status: DC

## 2018-11-04 NOTE — Patient Instructions (Addendum)
If you are age 61 or older, your body mass index should be between 23-30. Your Body mass index is 38.65 kg/m. If this is out of the aforementioned range listed, please consider follow up with your Primary Care Provider.  If you are age 55 or younger, your body mass index should be between 19-25. Your Body mass index is 38.65 kg/m. If this is out of the aformentioned range listed, please consider follow up with your Primary Care Provider.   To help prevent the possible spread of infection to our patients, communities, and staff; we will be implementing the following measures:  As of now we are not allowing any visitors/family members to accompany you to any upcoming appointments with [facility]. If you have any concerns about this please contact our office to discuss prior to the appointment.   You have been scheduled for an endoscopy. Please follow written instructions given to you at your visit today. If you use inhalers (even only as needed), please bring them with you on the day of your procedure. Your physician has requested that you go to www.startemmi.com and enter the access code given to you at your visit today. This web site gives a general overview about your procedure. However, you should still follow specific instructions given to you by our office regarding your preparation for the procedure.  We have sent the following medications to your pharmacy for you to pick up at your convenience:: Colestid 1g: Take twice a day  We will request your A1C  lab results from Dr. Chalmers Cater, Endocrinology.  Thank you for entrusting me with your care and for choosing [facility], Dr.  Cellar

## 2018-11-04 NOTE — Progress Notes (Signed)
HPI :  63 year old male here for a follow-up visit.   I last saw him in January 2019 for colonoscopy.  At that time he had a few small hyperplastic polyps and diverticulosis/internal hemorrhoids.  Random biopsies of his colon showed no evidence for microscopic colitis.  He has multiple symptoms he would like addressed today.  One of the main issues he has had was problems with what he calls projectile vomiting.  He developed acute onset of nausea, vomiting, abdominal pain and diarrhea on September 28.  He was seen in the emergency department for these issues.  CT scan done as below, he had a dilated stomach with few fluid-filled prominent proximal and mid small bowel, without any discrete transition point or evidence of obstruction.  He had mild hepatic steatosis noted, and suspected reactive lymphadenopathy in the right lower quadrant.  He has since had another episode of nausea vomiting loose stools as well, similar presentation.  Outside of these episodes he has had some occasional nausea but no vomiting.  He does feel that he has early satiety at times with his meals and has not been eating as well as he normally does.  In the ER he took some Levsin, Zofran, Lomotil which did help reduce some of his symptoms fairly quickly. He does have some baseline heartburn which is controlled with omeprazole 20 mg a day.  He does have postprandial belching that bothers him frequently.  I requested labs from his primary care done last year, his hemoglobin A1c was 9.5.  He is on an insulin pump, he also uses Metformin 1 g twice a day.  I previously saw him for intermittent loose stools with urgency.  He states he still has that at times.  He had been using a probiotic which she thinks helped for a little bit however he continues to take it with no benefit at all.  He has his gallbladder in place.  He reports his stools impact his lifestyle, he has unpredictable urges at times.  He usually has a few bowel  movements a day which are normal.  He does not know of any triggers which causes loose stools, and occurs weekly or so.  He is never had any incontinence.  No blood in his stools.  He denies any pains at baseline.  He otherwise does take Dilaudid a few times a week for chronic back pain.   Colonoscopy 01/11/2017 - The perianal and digital rectal examinations were normal. - The terminal ileum appeared normal. - A few small-mouthed diverticula were found in the sigmoid colon. - A 3 mm polyp was found in the sigmoid colon. The polyp was sessile. The polyp was removed with a cold biopsy forceps. Resection and retrieval were complete. - A 3 mm polyp was found in the rectum. The polyp was sessile. The polyp was removed with a cold biopsy forceps. Resection and retrieval were complete. - Internal hemorrhoids were found during retroflexion. - The exam was otherwise without abnormality. No overt inflammatory changes seen. - Biopsies for histology were taken with a cold forceps from the right colon, left colon and transverse colon for evaluation of microscopic colitis.  Hyperplastic polyps, negative for MC   CT scan 10/06/18 -  IMPRESSION: 1. Dilated stomach with fluid-filled prominent proximal and mid small bowel. There is no discrete transition point or frank obstruction. Findings may represent enteritis or ileus. 2. Mild hepatic steatosis. 3. Elevated right hemidiaphragm with complete compressive atelectasis of the right middle lobe and lesser  compressive atelectasis of the right lower lobe. 4. Single prominent right lower quadrant mesenteric node, as well as prominent retroperitoneal nodes are nonspecific, but likely reactive.     Past Medical History:  Diagnosis Date   Abscess    Anxiety    Arthritis    Chronic back pain    Essential hypertension, benign    GERD (gastroesophageal reflux disease)    History of chest pain    Negative ischemic workup over time   History of  kidney stones    Mixed hyperlipidemia    Myositis    Neuropathy    Recurrent kidney stones    Type 1 diabetes mellitus (HCC)    Insulin pump     Past Surgical History:  Procedure Laterality Date   ABDOMINAL EXPOSURE N/A 07/25/2016   Procedure: ABDOMINAL EXPOSURE;  Surgeon: Rosetta Posner, MD;  Location: [facility];  Service: Vascular;  Laterality: N/A;   ANTERIOR LUMBAR FUSION N/A 07/25/2016   Procedure: LUMBAR FOUR-FIVE ANTERIOR LUMBAR INTERBODY FUSION;  Surgeon: Eustace Moore, MD;  Location: [facility];  Service: Neurosurgery;  Laterality: N/A;   arthroscopies     both knees    BACK SURGERY     CARPAL TUNNEL RELEASE     DORSAL COMPARTMENT RELEASE Left 12/22/2015   Procedure: RELEASE DORSAL COMPARTMENT (DEQUERVAIN), left wrist;  Surgeon: [name], MD;  Location: [facility];  Service: Orthopedics;  Laterality: Left;  block   ELBOW SURGERY     both tendon repairs( both elbows)   INCISION AND DRAINAGE ABSCESS Right 02/06/2014   Procedure: INCISION AND DRAINAGE ABSCESS right groin abcess debridement skin and subqutaneous tissue;  Surgeon: Excell Seltzer, MD;  Location: [facility];  Service: General;  Laterality: Right;   STERIOD INJECTION Right 12/22/2015   Procedure: STEROID INJECTION;  Surgeon: [name], MD;  Location: [facility];  Service: Orthopedics;  Laterality: Right;   TOTAL KNEE ARTHROPLASTY Left 01/12/2015   Procedure: LEFT TOTAL KNEE ARTHROPLASTY;  Surgeon: [name], MD;  Location: [facility];  Service: Orthopedics;  Laterality: Left;   Family History  Problem Relation Age of Onset   Asthma Mother    Heart disease Father    Diabetes Father    Asthma Sister    Healthy Brother    Coronary artery disease Other    Leukemia Paternal Grandfather    Cirrhosis Maternal Aunt    Colon cancer Neg Hx    Rectal cancer Neg Hx    Esophageal cancer Neg Hx    Stomach cancer Neg Hx    Liver cancer Neg Hx    Social History     Tobacco Use   Smoking status: Never Smoker   Smokeless tobacco: Never Used  Substance Use Topics   Alcohol use: No    Alcohol/week: 0.0 standard drinks   Drug use: No   Current Outpatient Medications  Medication Sig Dispense Refill   aspirin EC 81 MG tablet Take 81 mg by mouth daily.     baclofen (LIORESAL) 10 MG tablet Take 10 mg by mouth 3 (three) times daily as needed.  0   diphenoxylate-atropine (LOMOTIL) 2.5-0.025 MG tablet Take 1-2 tablets by mouth 4 (four) times daily as needed for diarrhea or loose stools. 30 tablet 0   gabapentin (NEURONTIN) 300 MG capsule 300 mg. Takes one tab every morning & 2 tabs at bedtime     HUMULIN R 500 UNIT/ML injection 2 Units by Continuous infusion (non-IV) route continuous. 3  different basal rates 3 different bolus rates Use U-100 when hospitalized     HYDROmorphone (DILAUDID) 2 MG tablet Take 4 mg by mouth at bedtime.     hyoscyamine (LEVSIN SL) 0.125 MG SL tablet Place 1 tablet (0.125 mg total) under the tongue every 4 (four) hours as needed for cramping (abdominal pain). 30 tablet 0   ibuprofen (ADVIL,MOTRIN) 200 MG tablet Take 800 mg by mouth every 8 (eight) hours as needed for mild pain or moderate pain. On hold for surgery- since 07/14/2016     Insulin Human (INSULIN PUMP) SOLN Inject into the skin daily. Insulin Concentrated (Humulin R-500) unit/ml     lisinopril (PRINIVIL,ZESTRIL) 40 MG tablet TAKE ONE TABLET BY MOUTH DAILY. 90 tablet 3   metFORMIN (GLUCOPHAGE) 1000 MG tablet Take 1,000 mg by mouth 2 (two) times daily.  3   metoprolol succinate (TOPROL-XL) 50 MG 24 hr tablet TAKE ONE TABLET BY MOUTH DAILY. TAKE WITH OR IMMEDIATELY FOLLOWING A MEAL. 90 tablet 3   omeprazole (PRILOSEC) 20 MG capsule TAKE ONE CAPSULE BY MOUTH DAILY. 90 capsule 3   ondansetron (ZOFRAN) 4 MG tablet Take 1 tablet (4 mg total) by mouth every 6 (six) hours as needed for nausea or vomiting. 20 tablet 0   pravastatin (PRAVACHOL) 80 MG tablet TAKE ONE  TABLET BY MOUTH DAILY. 90 tablet 1   colestipol (COLESTID) 1 g tablet Take 1 tablet (1 g total) by mouth 2 (two) times daily. 60 tablet 3   montelukast (SINGULAIR) 10 MG tablet Take 10 mg by mouth daily as needed.  2   No current facility-administered medications for this visit.    No Known Allergies   Review of Systems: All systems reviewed and negative except where noted in HPI.    Ct Abdomen Pelvis W Contrast  Result Date: 10/06/2018 CLINICAL DATA:  Acute abdominal pain. Abdominal distension. Vomiting and diarrhea. Technologist note: Patient wearing continuous glucose monitor called dexcomg6. Website states CT scan is contraindication. Due to patient sickness patient agreed to have CT scan and contact company at later date. Discussed this with Radiologist Dr Marguerita Merles and Forestine Na ER Dr Betsey Holiday @ [phone] on 10/06/2018.Dr. Betsey Holiday agreed patient was ok to have CT scan and disregard further readings from device. Patient also informed and patient to manually check blood sugar, patient agreed to have CT scan and knows not to rely on device for further glucose readings until new unit. EXAM: CT ABDOMEN AND PELVIS WITH CONTRAST TECHNIQUE: Multidetector CT imaging of the abdomen and pelvis was performed using the standard protocol following bolus administration of intravenous contrast. CONTRAST:  17mL OMNIPAQUE IOHEXOL 300 MG/ML  SOLN COMPARISON:  None. FINDINGS: Lower chest: Elevated right hemidiaphragm. Complete compressive atelectasis of the right middle lobe, with lesser compressive atelectasis of the right lower lobe. Hepatobiliary: Mild hepatic steatosis without focal hepatic abnormality. Gallbladder physiologically distended, no calcified stone. No biliary dilatation. Pancreas: No ductal dilatation or inflammation. Spleen: Normal in size without focal abnormality. Adrenals/Urinary Tract: Normal adrenal glands. No hydronephrosis or perinephric edema. Homogeneous renal enhancement. Urinary bladder is  physiologically distended without wall thickening. Stomach/Bowel: Stomach is distended. No gastric wall thickening. Proximal small bowel and prominent, no discrete transition point. There is gradual transition from dilated to nondilated small bowel. Normal appendix. Majority of the colon is decompressed and not well assessed. No colonic wall thickening or inflammatory change. Vascular/Lymphatic: 13 mm right lower quadrant mesenteric lymph node series 2, image 67. Scattered prominent retroperitoneal nodes, largest aortocaval the level of the  left renal artery image 51. Few prominent periportal and portacaval nodes. Minor aortic atherosclerosis. No aneurysm. Reproductive: Prostate is unremarkable. Vas deferens calcifications seen with diabetes. Other: No free air, free fluid, or intra-abdominal fluid collection. Minimal scarring of the left lower quadrant anterior abdominal wall. Minimal fat in the right inguinal canal. Musculoskeletal: Degenerative and postsurgical change in the spine. There are no acute or suspicious osseous abnormalities. IMPRESSION: 1. Dilated stomach with fluid-filled prominent proximal and mid small bowel. There is no discrete transition point or frank obstruction. Findings may represent enteritis or ileus. 2. Mild hepatic steatosis. 3. Elevated right hemidiaphragm with complete compressive atelectasis of the right middle lobe and lesser compressive atelectasis of the right lower lobe. 4. Single prominent right lower quadrant mesenteric node, as well as prominent retroperitoneal nodes are nonspecific, but likely reactive. Aortic Atherosclerosis (ICD10-I70.0). Electronically Signed   By: Keith Rake M.D.   On: 10/06/2018 03:33   Lab Results  Component Value Date   WBC 17.4 (H) 10/06/2018   HGB 14.9 10/06/2018   HCT 49.2 10/06/2018   MCV 95.2 10/06/2018   PLT 309 10/06/2018    Lab Results  Component Value Date   CREATININE 1.03 10/06/2018   BUN 14 10/06/2018   NA 139 10/06/2018    K 4.8 10/06/2018   CL 105 10/06/2018   CO2 21 (L) 10/06/2018   Lab Results  Component Value Date   ALT 31 10/06/2018   AST 31 10/06/2018   ALKPHOS 73 10/06/2018   BILITOT 1.2 10/06/2018    Physical Exam: BP 120/70    Pulse 80    Temp 98.1 F (36.7 C)    Ht 6' (1.829 m)    Wt 285 lb (129.3 kg)    BMI 38.65 kg/m  Constitutional: Pleasant,well-developed, male in no acute distress. HEENT: Normocephalic and atraumatic. Conjunctivae are normal. No scleral icterus. Neck supple.  Cardiovascular: Normal rate, regular rhythm.  Pulmonary/chest: Effort normal and breath sounds normal. No wheezing, rales or rhonchi. Abdominal: Soft, nondistended, nontender. There are no masses palpable. No hepatomegaly. Extremities: no edema Lymphadenopathy: No cervical adenopathy noted. Neurological: Alert and oriented to person place and time. Skin: Skin is warm and dry. No rashes noted. Psychiatric: Normal mood and affect. Behavior is normal.   ASSESSMENT AND PLAN: 63 year old male here for reassessment of the following issues:  Nausea / vomiting / early satiety - he has some baseline early satiety and postprandial belching at baseline, now with a few acute episodes of severe worsening symptoms associated with significant vomiting.  Prior CT scan showed dilated stomach filled with fluid.  I am concerned in light of his longstanding diabetes, A1c of 9 when last checked, that he may have developed gastroparesis.  He has never had a prior upper endoscopy, I am recommending an EGD to further evaluate his upper tract and ensure no evidence of gastric outlet obstruction, etc.  I discussed risks and benefits of EGD and anesthesia and he want to proceed.  Further recommendations pending results.  I will provide some Zofran for him to use as needed moving forward he should continue his present dose of PPI as it controls his reflux.  I I also cautioned him to reduce if not stop his Dilaudid dosing for his chronic  pain, if he does have gastroparesis this will make his gastric emptying even worse.  He may warrant a gastric emptying study to confirm the diagnosis pending results of EGD. He agreed with the plan.  Altered bowel habits - some  intermittent loose stools with urgency, sometimes postprandial.  He has had a prior colonoscopy as negative for microscopic colitis.  It's possible he has some autonomic dysfunction in light of his poorly controlled diabetes.  We discussed differential.  I recommend he contact his endocrinologist to see if he can reduce if not stop his Metformin and see if that may help.  Otherwise offered him an empiric trial of Colestid 1 g twice a day to see if that will help.  He can also use Imodium as needed.  Further recommendations his course and EGD findings as above, may biopsy small bowel as well.  [place] Cellar, MD [facility]

## 2018-11-04 NOTE — Progress Notes (Signed)
Virtual Visit via Telephone Note   This visit type was conducted due to national recommendations for restrictions regarding the COVID-19 Pandemic (e.g. social distancing) in an effort to limit this patient's exposure and mitigate transmission in our community.  Due to his co-morbid illnesses, this patient is at least at moderate risk for complications without adequate follow up.  This format is felt to be most appropriate for this patient at this time.  The patient did not have access to video technology/had technical difficulties with video requiring transitioning to audio format only (telephone).  All issues noted in this document were discussed and addressed.  No physical exam could be performed with this format.  Please refer to the patient's chart for his  consent to telehealth for [facility].   Date:  11/05/2018   ID:  David Mcdowell, DOB 01-Apr-1955, MRN [id number]  Patient Location: Home Provider Location: Office  PCP:  [name], MD  Cardiologist:  Rozann Lesches, MD Electrophysiologist:  None   Evaluation Performed:  Follow-Up Visit  Chief Complaint:   Cardiac follow-up  History of Present Illness:    David Mcdowell is a 63 y.o. male last seen in December 2019.  We spoke by phone today.  He tells me that over the last 6 months or so he has been experiencing worsening dyspnea on exertion and diaphoresis, feeling of tightness in his chest at times.  No specific palpitations or syncope.  He reports compliance with his medications which are outlined below.  Last ischemic testing was in 2016 as outlined below.  Lexiscan Myoview was low risk.  He does have longstanding history of diabetes mellitus, currently on insulin pump.  He is undergoing GI work-up now with concerns for possible gastroparesis versus gastritis, EGD is planned next week.  He has had intermittent episodes of nausea and emesis.  Today we discussed follow-up ischemic testing, various techniques for  evaluation.  We reviewed possibility of proceeding with a cardiac CTA (he states his daughter works in the radiology department at [facility] and had already talked to him about this type of test).  My only concern with this is that he tends to run a fairly rapid heart rate likely associated with diabetes mellitus, already on beta-blocker.  If this can be adequately suppressed, imaging should be diagnostic.  The patient does not have symptoms concerning for COVID-19 infection (fever, chills, cough, or new shortness of breath).  He will have a COVID-19 test prior to EGD next week.   Past Medical History:  Diagnosis Date  . Abscess   . Anxiety   . Arthritis   . Chronic back pain   . Essential hypertension   . GERD (gastroesophageal reflux disease)   . History of chest pain    Negative ischemic workup over time  . History of kidney stones   . Mixed hyperlipidemia   . Myositis   . Neuropathy   . Recurrent kidney stones   . Type 1 diabetes mellitus (HCC)    Insulin pump   Past Surgical History:  Procedure Laterality Date  . ABDOMINAL EXPOSURE N/A 07/25/2016   Procedure: ABDOMINAL EXPOSURE;  Surgeon: Rosetta Posner, MD;  Location: [place];  Service: Vascular;  Laterality: N/A;  . ANTERIOR LUMBAR FUSION N/A 07/25/2016   Procedure: LUMBAR FOUR-FIVE ANTERIOR LUMBAR INTERBODY FUSION;  Surgeon: Eustace Moore, MD;  Location: [place];  Service: Neurosurgery;  Laterality: N/A;  . arthroscopies     both knees   . BACK  SURGERY    . CARPAL TUNNEL RELEASE    . DORSAL COMPARTMENT RELEASE Left 12/22/2015   Procedure: RELEASE DORSAL COMPARTMENT (DEQUERVAIN), left wrist;  Surgeon: [name], MD;  Location: [place];  Service: Orthopedics;  Laterality: Left;  block  . ELBOW SURGERY     both tendon repairs( both elbows)  . INCISION AND DRAINAGE ABSCESS Right 02/06/2014   Procedure: INCISION AND DRAINAGE ABSCESS right groin abcess debridement skin and subqutaneous tissue;  Surgeon:  Excell Seltzer, MD;  Location: [facility];  Service: General;  Laterality: Right;  . STERIOD INJECTION Right 12/22/2015   Procedure: STEROID INJECTION;  Surgeon: [name], MD;  Location: [place];  Service: Orthopedics;  Laterality: Right;  . TOTAL KNEE ARTHROPLASTY Left 01/12/2015   Procedure: LEFT TOTAL KNEE ARTHROPLASTY;  Surgeon: [name], MD;  Location: [place];  Service: Orthopedics;  Laterality: Left;     Current Meds  Medication Sig  . aspirin EC 81 MG tablet Take 81 mg by mouth daily.  . baclofen (LIORESAL) 10 MG tablet Take 10 mg by mouth 3 (three) times daily as needed.  . colestipol (COLESTID) 1 g tablet Take 1 tablet (1 g total) by mouth 2 (two) times daily.  . diphenoxylate-atropine (LOMOTIL) 2.5-0.025 MG tablet Take 1-2 tablets by mouth 4 (four) times daily as needed for diarrhea or loose stools.  . gabapentin (NEURONTIN) 300 MG capsule 300 mg. Takes one tab every morning & 2 tabs at bedtime  . HUMULIN R 500 UNIT/ML injection 2 Units by Continuous infusion (non-IV) route continuous. 3 different basal rates 3 different bolus rates Use U-100 when hospitalized  . HYDROmorphone (DILAUDID) 2 MG tablet Take 4 mg by mouth daily as needed.   . hyoscyamine (LEVSIN SL) 0.125 MG SL tablet Place 1 tablet (0.125 mg total) under the tongue every 4 (four) hours as needed for cramping (abdominal pain).  . Insulin Human (INSULIN PUMP) SOLN Inject into the skin daily. Insulin Concentrated (Humulin R-500) unit/ml  . JARDIANCE 25 MG TABS tablet Take 25 mg by mouth daily.  Marland Kitchen lisinopril (PRINIVIL,ZESTRIL) 40 MG tablet TAKE ONE TABLET BY MOUTH DAILY.  . metFORMIN (GLUCOPHAGE) 1000 MG tablet Take 1,000 mg by mouth 2 (two) times daily.  . metoprolol succinate (TOPROL-XL) 50 MG 24 hr tablet TAKE ONE TABLET BY MOUTH DAILY. TAKE WITH OR IMMEDIATELY FOLLOWING A MEAL.  . montelukast (SINGULAIR) 10 MG tablet Take 10 mg by mouth daily as needed.  Marland Kitchen omeprazole (PRILOSEC) 20 MG capsule  TAKE ONE CAPSULE BY MOUTH DAILY.  Marland Kitchen ondansetron (ZOFRAN) 4 MG tablet Take 1 tablet (4 mg total) by mouth every 6 (six) hours as needed for nausea or vomiting.  . pravastatin (PRAVACHOL) 80 MG tablet TAKE ONE TABLET BY MOUTH DAILY.     Allergies:   Patient has no known allergies.   Social History   Tobacco Use  . Smoking status: Never Smoker  . Smokeless tobacco: Never Used  Substance Use Topics  . Alcohol use: No    Alcohol/week: 0.0 standard drinks  . Drug use: No     Family Hx: The patient's family history includes Asthma in his mother and sister; Cirrhosis in his maternal aunt; Coronary artery disease in an other family member; Diabetes in his father; Healthy in his brother; Heart disease in his father; Leukemia in his paternal grandfather. There is no history of Colon cancer, Rectal cancer, Esophageal cancer, Stomach cancer, or Liver cancer.  ROS:   Please  see the history of present illness. All other systems reviewed and are negative.   Prior CV studies:   The following studies were reviewed today:  Lexiscan Cardiolite 08/06/2014:  The study is normal.  This is a low risk study.  The left ventricular ejection fraction is normal (55-65%).  There was no ST segment deviation noted during stress.  Normal resting and stress perfusion with no ischemia or infarction EF 65%  Echocardiogram 03/30/2013: Study Conclusions  - Left ventricle: The cavity size was normal. Systolic function was normal. The estimated ejection fraction was in the range of 55% to 60%. Doppler parameters are consistent with abnormal left ventricular relaxation (grade 1 diastolic dysfunction). Mild to moderate left ventricular hypertrophy. - Aortic valve: Mildly thickened, mildly calcified leaflets. There was no stenosis. - Mitral valve: Calcified annulus. Mildly thickened leaflets. No significant regurgitation.  Labs/Other Tests and Data Reviewed:    EKG:  An ECG dated 12/25/2017  was personally reviewed today and demonstrated:  Sinus rhythm with PVC and nonspecific T wave changes.  Recent Labs: 10/06/2018: ALT 31; BUN 14; Creatinine, Ser 1.03; Hemoglobin 14.9; Platelets 309; Potassium 4.8; Sodium 139   Recent Lipid Panel Lab Results  Component Value Date/Time   CHOL 175 06/13/2011 08:33 AM   TRIG 161.0 (H) 06/13/2011 08:33 AM   HDL 48.80 06/13/2011 08:33 AM   CHOLHDL 4 06/13/2011 08:33 AM   LDLCALC 94 06/13/2011 08:33 AM    Wt Readings from Last 3 Encounters:  11/05/18 285 lb (129.3 kg)  11/04/18 285 lb (129.3 kg)  10/06/18 275 lb (124.7 kg)     Objective:    Vital Signs:  BP 136/90   Pulse (!) 106   Ht 6' (1.829 m)   Wt 285 lb (129.3 kg)   SpO2 96%   BMI 38.65 kg/m    Patient spoke in full sentences, not short of breath. No audible coughing or wheezing. Speech pattern normal.  ASSESSMENT & PLAN:    1.  Progressive dyspnea on exertion and diaphoresis, rule out angina pectoris in a 63 year old male with longstanding insulin requiring diabetes mellitus, essential hypertension, and hyperlipidemia.  Last ischemic evaluation was via Cardiolite back in 2016, low risk at that point.  We plan to proceed with a cardiac CTA for further investigation, I am hopeful that his heart rate can be adequately suppressed with additional beta-blocker at the time of the procedure.  2.  Uncontrolled type 1 diabetes mellitus, on insulin pump.  Hemoglobin A1c was 9.5% in December 2019.  3.  Mixed hyperlipidemia, continues on Pravachol.  LDL 65 in December 2019.  4.  Essential hypertension, systolic is in the [id number] today.  Lisinopril and Toprol-XL.  COVID-19 Education: The signs and symptoms of COVID-19 were discussed with the patient and how to seek care for testing (follow up with PCP or arrange E-visit).  The importance of social distancing was discussed today.  Time:   Today, I have spent 10 minutes with the patient with telehealth technology discussing the above  problems.     Medication Adjustments/Labs and Tests Ordered: Current medicines are reviewed at length with the patient today.  Concerns regarding medicines are outlined above.   Tests Ordered: Orders Placed This Encounter  Procedures  . CT CORONARY MORPH W/CTA COR W/SCORE W/CA W/CM &/OR WO/CM  . CT CORONARY FRACTIONAL FLOW RESERVE DATA PREP  . CT CORONARY FRACTIONAL FLOW RESERVE FLUID ANALYSIS    Medication Changes: No orders of the defined types were placed in this encounter.  Follow Up:  Review test results and determine disposition.   Signed, Rozann Lesches, MD  11/05/2018 9:02 AM    [place]

## 2018-11-05 ENCOUNTER — Telehealth (INDEPENDENT_AMBULATORY_CARE_PROVIDER_SITE_OTHER): Payer: Medicare Other | Admitting: Cardiology

## 2018-11-05 ENCOUNTER — Encounter: Payer: Self-pay | Admitting: Cardiology

## 2018-11-05 ENCOUNTER — Encounter: Payer: Self-pay | Admitting: *Deleted

## 2018-11-05 VITALS — BP 136/90 | HR 106 | Ht 72.0 in | Wt 285.0 lb

## 2018-11-05 DIAGNOSIS — E1065 Type 1 diabetes mellitus with hyperglycemia: Secondary | ICD-10-CM

## 2018-11-05 DIAGNOSIS — E782 Mixed hyperlipidemia: Secondary | ICD-10-CM | POA: Diagnosis not present

## 2018-11-05 DIAGNOSIS — I10 Essential (primary) hypertension: Secondary | ICD-10-CM

## 2018-11-05 DIAGNOSIS — R079 Chest pain, unspecified: Secondary | ICD-10-CM

## 2018-11-05 DIAGNOSIS — I209 Angina pectoris, unspecified: Secondary | ICD-10-CM

## 2018-11-05 NOTE — Patient Instructions (Addendum)
Medication Instructions:   Your physician recommends that you continue on your current medications as directed. Please refer to the Current Medication list given to you today.  Labwork:  NONE-may need bmet for cardiac ct-we will notify you if so.   Testing/Procedures: Your physician has requested that you have cardiac CT. Cardiac computed tomography (CT) is a painless test that uses an x-ray machine to take clear, detailed pictures of your heart. For further information please visit [email or web address]. Please follow instruction sheet as given.--You will be contacted from our [facility] office about scheduling this test.   Follow-Up:  Your physician recommends that you schedule a follow-up appointment in: pending.  Any Other Special Instructions Will Be Listed Below (If Applicable).  If you need a refill on your cardiac medications before your next appointment, please call your pharmacy.  Your cardiac CT will be scheduled at one of the below locations:   [facility] [address] [place], [place] [address] [phone]   Please follow these instructions carefully (unless otherwise directed):  Hold all erectile dysfunction medications at least 3 days (72 hrs) prior to test.  On the Night Before the Test: Be sure to Drink plenty of water. Do not consume any caffeinated/decaffeinated beverages or chocolate 12 hours prior to your test. Do not take any antihistamines 12 hours prior to your test.  On the Day of the Test: Drink plenty of water. Do not drink any water within one hour of the test. Do not eat any food 4 hours prior to the test. You may take your regular medications prior to the test except metformin, jardiance and hold your insulin pump Hold your metformin 24 hours before and 48 hours after your test Take metoprolol succinate 100 mg two hours prior to test. HOLD Furosemide/Hydrochlorothiazide morning of the test.         After the Test: Drink  plenty of water. After receiving IV contrast, you may experience a mild flushed feeling. This is normal. On occasion, you may experience a mild rash up to 24 hours after the test. This is not dangerous. If this occurs, you can take Benadryl 25 mg and increase your fluid intake. If you experience trouble breathing, this can be serious. If it is severe call 911 IMMEDIATELY. If it is mild, please call our office. If you take any of these medications: Glipizide/Metformin, Avandament, Glucavance, please do not take 48 hours after completing test unless otherwise instructed.   Once we have confirmed authorization from your insurance company, we will call you to set up a date and time for your test.   For non-scheduling related questions, please contact the cardiac imaging nurse navigator should you have any questions/concerns: [name], RN Navigator Cardiac Imaging [facility] [phone] Office

## 2018-11-10 ENCOUNTER — Encounter: Payer: Self-pay | Admitting: Gastroenterology

## 2018-11-10 LAB — SARS CORONAVIRUS 2 (TAT 6-24 HRS): SARS Coronavirus 2: NEGATIVE

## 2018-11-12 ENCOUNTER — Encounter: Payer: Self-pay | Admitting: Gastroenterology

## 2018-11-12 ENCOUNTER — Other Ambulatory Visit: Payer: Self-pay | Admitting: *Deleted

## 2018-11-12 ENCOUNTER — Ambulatory Visit (AMBULATORY_SURGERY_CENTER): Payer: Medicare Other | Admitting: Gastroenterology

## 2018-11-12 ENCOUNTER — Other Ambulatory Visit: Payer: Self-pay

## 2018-11-12 VITALS — BP 103/55 | HR 99 | Temp 97.5°F | Resp 15 | Ht 72.0 in | Wt 285.0 lb

## 2018-11-12 DIAGNOSIS — I10 Essential (primary) hypertension: Secondary | ICD-10-CM

## 2018-11-12 DIAGNOSIS — R933 Abnormal findings on diagnostic imaging of other parts of digestive tract: Secondary | ICD-10-CM

## 2018-11-12 DIAGNOSIS — K317 Polyp of stomach and duodenum: Secondary | ICD-10-CM

## 2018-11-12 DIAGNOSIS — K3189 Other diseases of stomach and duodenum: Secondary | ICD-10-CM

## 2018-11-12 DIAGNOSIS — R6881 Early satiety: Secondary | ICD-10-CM

## 2018-11-12 DIAGNOSIS — R1112 Projectile vomiting: Secondary | ICD-10-CM

## 2018-11-12 MED ORDER — OMEPRAZOLE 20 MG PO CPDR: 20.0000 mg | DELAYED_RELEASE_CAPSULE | Freq: Two times a day (BID) | ORAL | 3 refills | Status: DC

## 2018-11-12 MED ORDER — SODIUM CHLORIDE 0.9 % IV SOLN: 500.0000 mL | INTRAVENOUS | Status: DC

## 2018-11-12 NOTE — Progress Notes (Signed)
Called to room to assist during endoscopic procedure.  Patient ID and intended procedure confirmed with present staff. Received instructions for my participation in the procedure from the performing physician.  

## 2018-11-12 NOTE — Patient Instructions (Signed)
  Increase prilosec to twice daily.   YOU HAD AN ENDOSCOPIC PROCEDURE TODAY AT [facility]:   Refer to the procedure report that was given to you for any specific questions about what was found during the examination.  If the procedure report does not answer your questions, please call your gastroenterologist to clarify.  If you requested that your care partner not be given the details of your procedure findings, then the procedure report has been included in a sealed envelope for you to review at your convenience later.  YOU SHOULD EXPECT: Some feelings of bloating in the abdomen. Passage of more gas than usual.  Walking can help get rid of the air that was put into your GI tract during the procedure and reduce the bloating. If you had a lower endoscopy (such as a colonoscopy or flexible sigmoidoscopy) you may notice spotting of blood in your stool or on the toilet paper. If you underwent a bowel prep for your procedure, you may not have a normal bowel movement for a few days.  Please Note:  You might notice some irritation and congestion in your nose or some drainage.  This is from the oxygen used during your procedure.  There is no need for concern and it should clear up in a day or so.  SYMPTOMS TO REPORT IMMEDIATELY:     Following upper endoscopy (EGD)  Vomiting of blood or coffee ground material  New chest pain or pain under the shoulder blades  Painful or persistently difficult swallowing  New shortness of breath  Fever of 100F or higher  Black, tarry-looking stools  For urgent or emergent issues, a gastroenterologist can be reached at any hour by calling [phone].   DIET:  We do recommend a small meal at first, but then you may proceed to your regular diet.  Drink plenty of fluids but you should avoid alcoholic beverages for 24 hours.  ACTIVITY:  You should plan to take it easy for the rest of today and you should NOT DRIVE or use heavy machinery until  tomorrow (because of the sedation medicines used during the test).    FOLLOW UP: Our staff will call the number listed on your records 48-72 hours following your procedure to check on you and address any questions or concerns that you may have regarding the information given to you following your procedure. If we do not reach you, we will leave a message.  We will attempt to reach you two times.  During this call, we will ask if you have developed any symptoms of COVID 19. If you develop any symptoms (ie: fever, flu-like symptoms, shortness of breath, cough etc.) before then, please call [phone].  If you test positive for Covid 19 in the 2 weeks post procedure, please call and report this information to [place].    If any biopsies were taken you will be contacted by phone or by letter within the next 1-3 weeks.  Please call us at [phone] if you have not heard about the biopsies in 3 weeks.    SIGNATURES/CONFIDENTIALITY: You and/or your care partner have signed paperwork which will be entered into your electronic medical record.  These signatures attest to the fact that that the information above on your After Visit Summary has been reviewed and is understood.  Full responsibility of the confidentiality of this discharge information lies with you and/or your care-partner.

## 2018-11-12 NOTE — Progress Notes (Signed)
Report given to PACU, vss 

## 2018-11-12 NOTE — Op Note (Signed)
[facility] Patient Name: David Mcdowell Procedure Date: 11/12/2018 10:08 AM MRN: [id number] Endoscopist: Remo Lipps P. Quaneisha Hanisch , MD Age: 63 Referring MD:  Date of Birth: [birth year] Gender: Male Account #: [id number] Procedure:                Upper GI endoscopy Indications:              Abnormal CT of the GI tract (dilated stomach, fluid                            filled bowel), Early satiety, Nausea with vomiting,                            history of diabetes - concern for gastroparesis,                            now on Zofran with significant improvement in                            symptoms, otherwise on omeprazole 20mg  / day at                            baseline Medicines:                Monitored Anesthesia Care Procedure:                Pre-Anesthesia Assessment:                           - Prior to the procedure, a History and Physical                            was performed, and patient medications and                            allergies were reviewed. The patient's tolerance of                            previous anesthesia was also reviewed. The risks                            and benefits of the procedure and the sedation                            options and risks were discussed with the patient.                            All questions were answered, and informed consent                            was obtained. Prior Anticoagulants: The patient has                            taken no previous anticoagulant or antiplatelet  agents. ASA Grade Assessment: II - A patient with                            mild systemic disease. After reviewing the risks                            and benefits, the patient was deemed in                            satisfactory condition to undergo the procedure.                           After obtaining informed consent, the endoscope was                            passed under direct vision. Throughout the                             procedure, the patient's blood pressure, pulse, and                            oxygen saturations were monitored continuously. The                            Endoscope was introduced through the mouth, and                            advanced to the second part of duodenum. The upper                            GI endoscopy was accomplished without difficulty.                            The patient tolerated the procedure well. Scope In: Scope Out: Findings:                 Esophagogastric landmarks were identified: the                            Z-line was found at 43 cm, the gastroesophageal                            junction was found at 43 cm and the upper extent of                            the gastric folds was found at 43 cm from the                            incisors.                           The exam of the esophagus was otherwise normal. No  Barrett's esophagus                           A few 3 to 4 mm sessile polyps were found in the                            gastric antrum, with some surface erosive changes,                            suspect benign hyperplastic changes. Biopsies were                            taken with a cold forceps for histology.                           The exam of the stomach was otherwise normal.                           Biopsies were taken with a cold forceps in the                            gastric body, at the incisura and in the gastric                            antrum for Helicobacter pylori testing.                           The duodenal bulb and second portion of the                            duodenum were normal. Biopsies for histology were                            taken with a cold forceps for evaluation of celiac                            disease. Complications:            No immediate complications. Estimated blood loss:                            Minimal. Estimated Blood  Loss:     Estimated blood loss was minimal. Impression:               - Esophagogastric landmarks identified.                           - Normal esophagus, no Barrett's.                           - A few small benign appearing gastric polyps with                            some superficial erosive changes, suspect benign  hyperplastic changes. Biopsied.                           - Normal stomach otherwise - biopsies taken to rule                            out H pylori                           - Normal duodenal bulb and second portion of the                            duodenum. Biopsied. Recommendation:           - Patient has a contact number available for                            emergencies. The signs and symptoms of potential                            delayed complications were discussed with the                            patient. Return to normal activities tomorrow.                            Written discharge instructions were provided to the                            patient.                           - Resume previous diet.                           - Continue present medications.                           - Continue Zofran as needed                           - Increase omeprazole to twice daily given erosive                            changes described                           - Await pathology results. Remo Lipps P. Havery Moros, MD 11/12/2018 10:28:39 AM This report has been signed electronically.

## 2018-11-13 ENCOUNTER — Other Ambulatory Visit (HOSPITAL_COMMUNITY)
Admission: RE | Admit: 2018-11-13 | Discharge: 2018-11-13 | Disposition: A | Discharge status: Home / Self Care | Payer: Medicare Other | Source: Ambulatory Visit | Attending: Cardiology | Admitting: Cardiology

## 2018-11-13 DIAGNOSIS — I10 Essential (primary) hypertension: Secondary | ICD-10-CM | POA: Diagnosis present

## 2018-11-13 LAB — BASIC METABOLIC PANEL
Anion gap: 6 (ref 5–15)
BUN: 12 mg/dL (ref 8–23)
CO2: 24 mmol/L (ref 22–32)
Calcium: 8.6 mg/dL — ABNORMAL LOW (ref 8.9–10.3)
Chloride: 109 mmol/L (ref 98–111)
Creatinine, Ser: 0.9 mg/dL (ref 0.61–1.24)
GFR calc Af Amer: >60 mL/min (ref >60)
GFR calc non Af Amer: >60 mL/min (ref >60)
Glucose, Bld: 188 mg/dL — ABNORMAL HIGH (ref 70–99)
Potassium: 4.1 mmol/L (ref 3.5–5.1)
Sodium: 139 mmol/L (ref 135–145)

## 2018-11-14 ENCOUNTER — Telehealth: Payer: Self-pay

## 2018-11-14 NOTE — Telephone Encounter (Signed)
  Follow up Call-  Call back number 11/12/2018 01/11/2017  Post procedure Call Back phone  # [phone]  Permission to leave phone message Yes Yes  Some recent data might be hidden     Patient questions:  Do you have a fever, pain , or abdominal swelling? No. Pain Score  0 *  Have you tolerated food without any problems? Yes.    Have you been able to return to your normal activities? Yes.    Do you have any questions about your discharge instructions: Diet   No. Medications  No. Follow up visit  No.  Do you have questions or concerns about your Care? No.  Actions: * If pain score is 4 or above: No action needed, pain <4.  1. Have you developed a fever since your procedure? no  2.   Have you had an respiratory symptoms (SOB or cough) since your procedure?no  3.   Have you tested positive for COVID 19 since your procedure no  4.   Have you had any family members/close contacts diagnosed with the COVID 19 since your procedure? no   If yes to any of these questions please route to Joylene John, RN and Alphonsa Gin, Therapist, sports.

## 2018-11-26 ENCOUNTER — Telehealth (HOSPITAL_COMMUNITY): Payer: Self-pay | Admitting: Emergency Medicine

## 2018-11-26 NOTE — Telephone Encounter (Signed)
Reaching out to patient to offer assistance regarding upcoming cardiac imaging study; pt verbalizes understanding of appt date/time, parking situation and where to check in, pre-test NPO status and medications ordered, and verified current allergies; name and call back number provided for further questions should they arise David Bond RN David Mcdowell and V[facility] [phone] office [phone] cell  Verified which medications patient was to hold day of test and double up on day of test. Pt verbalized understanding.

## 2018-11-27 ENCOUNTER — Ambulatory Visit (HOSPITAL_COMMUNITY)
Admission: RE | Admit: 2018-11-27 | Discharge: 2018-11-27 | Disposition: A | Discharge status: Home / Self Care | Payer: Medicare Other | Source: Ambulatory Visit | Attending: Cardiology | Admitting: Cardiology

## 2018-11-27 ENCOUNTER — Other Ambulatory Visit: Payer: Self-pay

## 2018-11-27 ENCOUNTER — Encounter (HOSPITAL_COMMUNITY): Payer: Self-pay

## 2018-11-27 DIAGNOSIS — R079 Chest pain, unspecified: Secondary | ICD-10-CM | POA: Insufficient documentation

## 2018-11-27 MED ORDER — METOPROLOL TARTRATE 5 MG/5ML IV SOLN
5.0000 mg | INTRAVENOUS | Status: DC | PRN
  Administered 2018-11-27 (×4): 5 mg via INTRAVENOUS

## 2018-11-27 MED ORDER — NITROGLYCERIN 0.4 MG SL SUBL: 0.8000 mg | SUBLINGUAL_TABLET | Freq: Once | SUBLINGUAL | Status: DC

## 2018-11-27 MED ORDER — DILTIAZEM HCL 25 MG/5ML IV SOLN
10.0000 mg | Freq: Once | INTRAVENOUS | Status: AC
  Administered 2018-11-27: 10 mg via INTRAVENOUS
  Filled 2018-11-27: qty 5

## 2018-11-27 MED ORDER — IVABRADINE HCL 5 MG PO TABS: 5.0000 mg | ORAL_TABLET | Freq: Once | ORAL | Status: DC

## 2018-11-27 MED ORDER — IVABRADINE HCL 5 MG PO TABS
5.0000 mg | ORAL_TABLET | ORAL | Status: AC
  Administered 2018-11-27: 5 mg via ORAL
  Filled 2018-11-27 (×2): qty 1

## 2018-11-27 MED ORDER — METOPROLOL TARTRATE 5 MG/5ML IV SOLN
INTRAVENOUS | Status: AC
  Administered 2018-11-27: 5 mg via INTRAVENOUS
  Filled 2018-11-27: qty 5

## 2018-11-27 MED ORDER — DILTIAZEM HCL 25 MG/5ML IV SOLN
INTRAVENOUS | Status: AC
  Administered 2018-11-27: 5 mg
  Filled 2018-11-27: qty 5

## 2018-11-27 MED ORDER — DILTIAZEM LOAD VIA INFUSION
5.0000 mg | Freq: Once | INTRAVENOUS | Status: DC
  Filled 2018-11-27: qty 5

## 2018-11-27 MED ORDER — METOPROLOL TARTRATE 5 MG/5ML IV SOLN
INTRAVENOUS | Status: AC
  Administered 2018-11-27: 5 mg via INTRAVENOUS
  Filled 2018-11-27: qty 15

## 2018-11-27 NOTE — Progress Notes (Signed)
Spoke with Dr. Aundra Dubin regarding patients BP 132/78, HR 88 and medications given per [facility].  Spoke to patient and family at bedside regarding rescheduling the test.  CT Navigator made aware the test was unable to be completed.

## 2018-11-28 ENCOUNTER — Other Ambulatory Visit: Payer: Self-pay | Admitting: Cardiology

## 2018-12-01 NOTE — Telephone Encounter (Signed)
Thank you for letting me know.  I was concerned that getting your heart rate down adequately to get good imaging might be an issue with a cardiac CTA.  We can certainly always consider scheduling a diagnostic cardiac catheterization as an outpatient as well which would provide [place] diagnostic anatomical information.  I do not think that setting up another stress test (Myoview) would necessarily be as helpful.  I will ask nursing to look at the schedule to see if we can move your visit up.

## 2018-12-09 ENCOUNTER — Ambulatory Visit: Payer: Medicare Other | Admitting: Cardiology

## 2018-12-10 ENCOUNTER — Ambulatory Visit: Payer: Medicare Other | Admitting: Cardiology

## 2018-12-10 ENCOUNTER — Encounter: Payer: Self-pay | Admitting: Cardiology

## 2018-12-10 ENCOUNTER — Other Ambulatory Visit: Payer: Self-pay | Admitting: Cardiology

## 2018-12-10 ENCOUNTER — Other Ambulatory Visit: Payer: Self-pay

## 2018-12-10 VITALS — BP 153/86 | HR 100 | Temp 97.5°F | Ht 72.0 in | Wt 292.0 lb

## 2018-12-10 DIAGNOSIS — I200 Unstable angina: Secondary | ICD-10-CM | POA: Diagnosis not present

## 2018-12-10 DIAGNOSIS — E1043 Type 1 diabetes mellitus with diabetic autonomic (poly)neuropathy: Secondary | ICD-10-CM

## 2018-12-10 DIAGNOSIS — I10 Essential (primary) hypertension: Secondary | ICD-10-CM | POA: Diagnosis not present

## 2018-12-10 DIAGNOSIS — E782 Mixed hyperlipidemia: Secondary | ICD-10-CM | POA: Diagnosis not present

## 2018-12-10 NOTE — Patient Instructions (Addendum)
  Medication Instructions:  Your physician recommends that you continue on your current medications as directed. Please refer to the Current Medication list given to you today.  *If you need a refill on your cardiac medications before your next appointment, please call your pharmacy*  Lab Work: cbc,bmet on Friday 12/4  If you have labs (blood work) drawn today and your tests are completely normal, you will receive your results only by: Marland Kitchen MyChart Message (if you have MyChart) OR . A paper copy in the mail If you have any lab test that is abnormal or we need to change your treatment, we will call you to review the results.  Testing/Procedures: Left heart cath 12/16/2018 at 9 am at [facility]  Stop by office Friday to pick up instructions and lab slip  Follow-Up: At [facility], you and your health needs are our priority.  As part of our continuing mission to provide you with exceptional heart care, we have created designated Provider Care Teams.  These Care Teams include your primary Cardiologist (physician) and Advanced Practice Providers (APPs -  Physician Assistants and Nurse Practitioners) who all work together to provide you with the care you need, when you need it.  Your next appointment:   1 month(s)  The format for your next appointment:   In Person  Provider:   Rozann Lesches, MD  Other Instructions None     Thank you for choosing [facility] !

## 2018-12-10 NOTE — Progress Notes (Signed)
Cardiology Office Note  Date: 12/10/2018   ID: David Mcdowell, DOB Aug 25, 1955, MRN [id number]  PCP:  [name], MD  Cardiologist:  Rozann Lesches, MD Electrophysiologist:  None   Chief Complaint  Patient presents with  . Shortness of Breath    History of Present Illness: David Mcdowell is a 63 y.o. male last assessed via telehealth encounter in October.  He was referred for a cardiac CTA for follow-up ischemic evaluation in light of dyspnea on exertion and diaphoresis in the setting of insulin requiring diabetes mellitus, hypertension, and hyperlipidemia.  Previous Cardiolite in 2016 was low risk.  Unfortunately, he was not able to complete the cardiac CTA due to inadequately controlled heart rate despite use of additional AV nodal blockers at the time of the study (has autonomic dysfunction in the setting of diabetes mellitus).  He presents today with his wife to discuss diagnostic cardiac catheterization.  He continues to report dyspnea exertion and fatigue, no definite exertional tightness but lack of stamina.  He has longstanding type 1 diabetes mellitus, currently on insulin pump and maintains regular follow-up with endocrinology.  Today we discussed the risks and benefits of a diagnostic cardiac catheterization and he is in agreement to proceed.  Past Medical History:  Diagnosis Date  . Abscess   . Anxiety   . Arthritis   . Chronic back pain   . Essential hypertension   . GERD (gastroesophageal reflux disease)   . History of kidney stones   . Mixed hyperlipidemia   . Myositis   . Neuropathy   . Recurrent kidney stones   . Type 1 diabetes mellitus (HCC)    Insulin pump    Past Surgical History:  Procedure Laterality Date  . ABDOMINAL EXPOSURE N/A 07/25/2016   Procedure: ABDOMINAL EXPOSURE;  Surgeon: Rosetta Posner, MD;  Location: [place];  Service: Vascular;  Laterality: N/A;  . ANTERIOR LUMBAR FUSION N/A 07/25/2016   Procedure: LUMBAR FOUR-FIVE ANTERIOR LUMBAR  INTERBODY FUSION;  Surgeon: Eustace Moore, MD;  Location: [place];  Service: Neurosurgery;  Laterality: N/A;  . BACK SURGERY    . CARPAL TUNNEL RELEASE    . DORSAL COMPARTMENT RELEASE Left 12/22/2015   Procedure: RELEASE DORSAL COMPARTMENT (DEQUERVAIN), left wrist;  Surgeon: [name], MD;  Location: [place];  Service: Orthopedics;  Laterality: Left;  block  . ELBOW SURGERY     both tendon repairs( both elbows)  . INCISION AND DRAINAGE ABSCESS Right 02/06/2014   Procedure: INCISION AND DRAINAGE ABSCESS right groin abcess debridement skin and subqutaneous tissue;  Surgeon: Excell Seltzer, MD;  [facility]: [facility];  Service: General;  Laterality: Right;  . STERIOD INJECTION Right 12/22/2015   Procedure: STEROID INJECTION;  Surgeon: [name], MD;  Location: [place];  Service: Orthopedics;  Laterality: Right;  . TOTAL KNEE ARTHROPLASTY Left 01/12/2015   Procedure: LEFT TOTAL KNEE ARTHROPLASTY;  Surgeon: [name], MD;  Location: [place];  Service: Orthopedics;  Laterality: Left;    Current Outpatient Medications  Medication Sig Dispense Refill  . aspirin EC 81 MG tablet Take 81 mg by mouth daily.    . baclofen (LIORESAL) 10 MG tablet Take 10 mg by mouth 3 (three) times daily as needed.  0  . colestipol (COLESTID) 1 g tablet Take 1 tablet (1 g total) by mouth 2 (two) times daily. 60 tablet 3  . diphenoxylate-atropine (LOMOTIL) 2.5-0.025 MG tablet Take 1-2 tablets by mouth 4 (four) times  daily as needed for diarrhea or loose stools. 30 tablet 0  . gabapentin (NEURONTIN) 300 MG capsule 300 mg. Takes one tab every morning & 2 tabs at bedtime    . HUMULIN R 500 UNIT/ML injection 2 Units by Continuous infusion (non-IV) route continuous. 3 different basal rates 3 different bolus rates Use U-100 when hospitalized    . HYDROmorphone (DILAUDID) 2 MG tablet Take 4 mg by mouth daily as needed.     . hyoscyamine (LEVSIN SL) 0.125 MG SL tablet Place 1 tablet  (0.125 mg total) under the tongue every 4 (four) hours as needed for cramping (abdominal pain). 30 tablet 0  . Insulin Human (INSULIN PUMP) SOLN Inject into the skin daily. Insulin Concentrated (Humulin R-500) unit/ml    . JARDIANCE 25 MG TABS tablet Take 25 mg by mouth daily.    Marland Kitchen lisinopril (ZESTRIL) 40 MG tablet TAKE ONE TABLET BY MOUTH DAILY. 90 tablet 3  . metFORMIN (GLUCOPHAGE) 1000 MG tablet Take 1,000 mg by mouth 2 (two) times daily.  3  . metoprolol succinate (TOPROL-XL) 50 MG 24 hr tablet TAKE ONE TABLET BY MOUTH DAILY. TAKE WITH OR IMMEDIATELY FOLLOWING A MEAL. 90 tablet 3  . montelukast (SINGULAIR) 10 MG tablet Take 10 mg by mouth daily as needed.  2  . omeprazole (PRILOSEC) 20 MG capsule Take 1 capsule (20 mg total) by mouth 2 (two) times daily. 60 capsule 3  . ondansetron (ZOFRAN) 4 MG tablet Take 1 tablet (4 mg total) by mouth every 6 (six) hours as needed for nausea or vomiting. 20 tablet 0  . pravastatin (PRAVACHOL) 80 MG tablet TAKE ONE TABLET BY MOUTH DAILY. 90 tablet 1   No current facility-administered medications for this visit.    Allergies:  Patient has no known allergies.   Social History: The patient  reports that he has never smoked. He has never used smokeless tobacco. He reports that he does not drink alcohol or use drugs.   Family History: The patient's family history includes Asthma in his mother and sister; Cirrhosis in his maternal aunt; Coronary artery disease in an other family member; Diabetes in his father; Healthy in his brother; Heart disease in his father; Leukemia in his paternal grandfather.  ROS:  Please see the history of present illness. Otherwise, complete review of systems is positive for none.  All other systems are reviewed and negative.   Physical Exam: VS:  BP (!) 153/86   Pulse 100   Temp (!) 97.5 F (36.4 C)   Ht 6' (1.829 m)   Wt 292 lb (132.5 kg)   SpO2 94%   BMI 39.60 kg/m , BMI Body mass index is 39.6 kg/m.  Wt Readings from  Last 3 Encounters:  12/10/18 292 lb (132.5 kg)  11/12/18 285 lb (129.3 kg)  11/05/18 285 lb (129.3 kg)    General: Patient appears comfortable at rest. HEENT: Conjunctiva and lids normal, wearing a mask. Neck: Supple, no elevated JVP or carotid bruits, no thyromegaly. Lungs: Clear to auscultation, nonlabored breathing at rest. Cardiac: Regular rate and rhythm, no S3 or significant systolic murmur, no pericardial rub. Abdomen: Protuberant, nontender, bowel sounds present. Extremities: No pitting edema, distal pulses 2+. Skin: Warm and dry. Musculoskeletal: No kyphosis. Neuropsychiatric: Alert and oriented x3, affect grossly appropriate.  ECG:  An ECG dated 12/25/2017 was personally reviewed today and demonstrated:  Sinus rhythm with PVC and nonspecific T wave changes.  Recent Labwork: 10/06/2018: ALT 31; AST 31; Hemoglobin 14.9; Platelets 309 11/13/2018:  BUN 12; Creatinine, Ser 0.90; Potassium 4.1; Sodium 139  December 2019: Hemoglobin A1c 9.5%, hemoglobin 14.1, platelets 190, potassium 4.8, BUN 9, creatinine 0.79, AST 29, ALT 24, cholesterol 131, triglycerides 107, HDL 45, LDL 65  Other Studies Reviewed Today:  Echocardiogram 03/30/2013: Study Conclusions   - Left ventricle: The cavity size was normal. Systolic  function was normal. The estimated ejection fraction was  in the range of 55% to 60%. Doppler parameters are  consistent with abnormal left ventricular relaxation  (grade 1 diastolic dysfunction). Mild to moderate left  ventricular hypertrophy.  - Aortic valve: Mildly thickened, mildly calcified leaflets.  There was no stenosis.  - Mitral valve: Calcified annulus. Mildly thickened leaflets. No significant        regurgitation.   Assessment and Plan:  1.  Progressive dyspnea on exertion and fatigue concerning for anginal equivalent in a 63 year old male with longstanding type 1 diabetes mellitus on insulin pump, hyperlipidemia, and hypertension.  We discussed  the risks and benefits of a diagnostic cardiac catheterization and he is in agreement to proceed.  Continue aspirin, beta-blocker, ACE inhibitor, and statin for now.  2.  Mixed hyperlipidemia, on Pravachol with last LDL 65.  3.  Type 1 diabetes mellitus on insulin pump.  He continues to follow with endocrinology.  4.  Essential hypertension,, on Toprol-XL and lisinopril.  Blood pressure elevated today.  Weight loss would be beneficial.  May also need further medication titration.  Medication Adjustments/Labs and Tests Ordered: Current medicines are reviewed at length with the patient today.  Concerns regarding medicines are outlined above.   Tests Ordered: Orders Placed This Encounter  Procedures  . CBC w/Diff/Platelet  . Basic Metabolic Panel (BMET)    Medication Changes: No orders of the defined types were placed in this encounter.   Disposition:  Follow up after procedure.  Signed, Satira Sark, MD, [facility] 12/10/2018 3:01 PM    [place] at [facility] [address], [place], [place] [address] Phone: [phone]; Fax: [phone]

## 2018-12-10 NOTE — H&P (View-Only) (Signed)
Cardiology Office Note  Date: 12/10/2018   ID: David Mcdowell, DOB 20-Feb-1955, MRN [id number]  PCP:  [name], MD  Cardiologist:  Rozann Lesches, MD Electrophysiologist:  None   Chief Complaint  Patient presents with  . Shortness of Breath    History of Present Illness: David Mcdowell is a 63 y.o. male last assessed via telehealth encounter in October.  He was referred for a cardiac CTA for follow-up ischemic evaluation in light of dyspnea on exertion and diaphoresis in the setting of insulin requiring diabetes mellitus, hypertension, and hyperlipidemia.  Previous Cardiolite in 2016 was low risk.  Unfortunately, he was not able to complete the cardiac CTA due to inadequately controlled heart rate despite use of additional AV nodal blockers at the time of the study (has autonomic dysfunction in the setting of diabetes mellitus).  He presents today with his wife to discuss diagnostic cardiac catheterization.  He continues to report dyspnea exertion and fatigue, no definite exertional tightness but lack of stamina.  He has longstanding type 1 diabetes mellitus, currently on insulin pump and maintains regular follow-up with endocrinology.  Today we discussed the risks and benefits of a diagnostic cardiac catheterization and he is in agreement to proceed.  Past Medical History:  Diagnosis Date  . Abscess   . Anxiety   . Arthritis   . Chronic back pain   . Essential hypertension   . GERD (gastroesophageal reflux disease)   . History of kidney stones   . Mixed hyperlipidemia   . Myositis   . Neuropathy   . Recurrent kidney stones   . Type 1 diabetes mellitus (HCC)    Insulin pump    Past Surgical History:  Procedure Laterality Date  . ABDOMINAL EXPOSURE N/A 07/25/2016   Procedure: ABDOMINAL EXPOSURE;  Surgeon: Rosetta Posner, MD;  Location: [place];  Service: Vascular;  Laterality: N/A;  . ANTERIOR LUMBAR FUSION N/A 07/25/2016   Procedure: LUMBAR FOUR-FIVE ANTERIOR LUMBAR  INTERBODY FUSION;  Surgeon: Eustace Moore, MD;  Location: [place];  Service: Neurosurgery;  Laterality: N/A;  . BACK SURGERY    . CARPAL TUNNEL RELEASE    . DORSAL COMPARTMENT RELEASE Left 12/22/2015   Procedure: RELEASE DORSAL COMPARTMENT (DEQUERVAIN), left wrist;  Surgeon: [name], MD;  Location: [place];  Service: Orthopedics;  Laterality: Left;  block  . ELBOW SURGERY     both tendon repairs( both elbows)  . INCISION AND DRAINAGE ABSCESS Right 02/06/2014   Procedure: INCISION AND DRAINAGE ABSCESS right groin abcess debridement skin and subqutaneous tissue;  Surgeon: Excell Seltzer, MD;  [facility];  Service: General;  Laterality: Right;  . STERIOD INJECTION Right 12/22/2015   Procedure: STEROID INJECTION;  Surgeon: [name], MD;  Location: [place];  Service: Orthopedics;  Laterality: Right;  . TOTAL KNEE ARTHROPLASTY Left 01/12/2015   Procedure: LEFT TOTAL KNEE ARTHROPLASTY;  Surgeon: [name], MD;  Location: [place];  Service: Orthopedics;  Laterality: Left;    Current Outpatient Medications  Medication Sig Dispense Refill  . aspirin EC 81 MG tablet Take 81 mg by mouth daily.    . baclofen (LIORESAL) 10 MG tablet Take 10 mg by mouth 3 (three) times daily as needed.  0  . colestipol (COLESTID) 1 g tablet Take 1 tablet (1 g total) by mouth 2 (two) times daily. 60 tablet 3  . diphenoxylate-atropine (LOMOTIL) 2.5-0.025 MG tablet Take 1-2 tablets by mouth 4 (four) times  daily as needed for diarrhea or loose stools. 30 tablet 0  . gabapentin (NEURONTIN) 300 MG capsule 300 mg. Takes one tab every morning & 2 tabs at bedtime    . HUMULIN R 500 UNIT/ML injection 2 Units by Continuous infusion (non-IV) route continuous. 3 different basal rates 3 different bolus rates Use U-100 when hospitalized    . HYDROmorphone (DILAUDID) 2 MG tablet Take 4 mg by mouth daily as needed.     . hyoscyamine (LEVSIN SL) 0.125 MG SL tablet Place 1 tablet  (0.125 mg total) under the tongue every 4 (four) hours as needed for cramping (abdominal pain). 30 tablet 0  . Insulin Human (INSULIN PUMP) SOLN Inject into the skin daily. Insulin Concentrated (Humulin R-500) unit/ml    . JARDIANCE 25 MG TABS tablet Take 25 mg by mouth daily.    Marland Kitchen lisinopril (ZESTRIL) 40 MG tablet TAKE ONE TABLET BY MOUTH DAILY. 90 tablet 3  . metFORMIN (GLUCOPHAGE) 1000 MG tablet Take 1,000 mg by mouth 2 (two) times daily.  3  . metoprolol succinate (TOPROL-XL) 50 MG 24 hr tablet TAKE ONE TABLET BY MOUTH DAILY. TAKE WITH OR IMMEDIATELY FOLLOWING A MEAL. 90 tablet 3  . montelukast (SINGULAIR) 10 MG tablet Take 10 mg by mouth daily as needed.  2  . omeprazole (PRILOSEC) 20 MG capsule Take 1 capsule (20 mg total) by mouth 2 (two) times daily. 60 capsule 3  . ondansetron (ZOFRAN) 4 MG tablet Take 1 tablet (4 mg total) by mouth every 6 (six) hours as needed for nausea or vomiting. 20 tablet 0  . pravastatin (PRAVACHOL) 80 MG tablet TAKE ONE TABLET BY MOUTH DAILY. 90 tablet 1   No current facility-administered medications for this visit.    Allergies:  Patient has no known allergies.   Social History: The patient  reports that he has never smoked. He has never used smokeless tobacco. He reports that he does not drink alcohol or use drugs.   Family History: The patient's family history includes Asthma in his mother and sister; Cirrhosis in his maternal aunt; Coronary artery disease in an other family member; Diabetes in his father; Healthy in his brother; Heart disease in his father; Leukemia in his paternal grandfather.  ROS:  Please see the history of present illness. Otherwise, complete review of systems is positive for none.  All other systems are reviewed and negative.   Physical Exam: VS:  BP (!) 153/86   Pulse 100   Temp (!) 97.5 F (36.4 C)   Ht 6' (1.829 m)   Wt 292 lb (132.5 kg)   SpO2 94%   BMI 39.60 kg/m , BMI Body mass index is 39.6 kg/m.  Wt Readings from  Last 3 Encounters:  12/10/18 292 lb (132.5 kg)  11/12/18 285 lb (129.3 kg)  11/05/18 285 lb (129.3 kg)    General: Patient appears comfortable at rest. HEENT: Conjunctiva and lids normal, wearing a mask. Neck: Supple, no elevated JVP or carotid bruits, no thyromegaly. Lungs: Clear to auscultation, nonlabored breathing at rest. Cardiac: Regular rate and rhythm, no S3 or significant systolic murmur, no pericardial rub. Abdomen: Protuberant, nontender, bowel sounds present. Extremities: No pitting edema, distal pulses 2+. Skin: Warm and dry. Musculoskeletal: No kyphosis. Neuropsychiatric: Alert and oriented x3, affect grossly appropriate.  ECG:  An ECG dated 12/25/2017 was personally reviewed today and demonstrated:  Sinus rhythm with PVC and nonspecific T wave changes.  Recent Labwork: 10/06/2018: ALT 31; AST 31; Hemoglobin 14.9; Platelets 309 11/13/2018:  BUN 12; Creatinine, Ser 0.90; Potassium 4.1; Sodium 139  December 2019: Hemoglobin A1c 9.5%, hemoglobin 14.1, platelets 190, potassium 4.8, BUN 9, creatinine 0.79, AST 29, ALT 24, cholesterol 131, triglycerides 107, HDL 45, LDL 65  Other Studies Reviewed Today:  Echocardiogram 03/30/2013: Study Conclusions   - Left ventricle: The cavity size was normal. Systolic  function was normal. The estimated ejection fraction was  in the range of 55% to 60%. Doppler parameters are  consistent with abnormal left ventricular relaxation  (grade 1 diastolic dysfunction). Mild to moderate left  ventricular hypertrophy.  - Aortic valve: Mildly thickened, mildly calcified leaflets.  There was no stenosis.  - Mitral valve: Calcified annulus. Mildly thickened leaflets. No significant        regurgitation.   Assessment and Plan:  1.  Progressive dyspnea on exertion and fatigue concerning for anginal equivalent in a 63 year old male with longstanding type 1 diabetes mellitus on insulin pump, hyperlipidemia, and hypertension.  We discussed  the risks and benefits of a diagnostic cardiac catheterization and he is in agreement to proceed.  Continue aspirin, beta-blocker, ACE inhibitor, and statin for now.  2.  Mixed hyperlipidemia, on Pravachol with last LDL 65.  3.  Type 1 diabetes mellitus on insulin pump.  He continues to follow with endocrinology.  4.  Essential hypertension,, on Toprol-XL and lisinopril.  Blood pressure elevated today.  Weight loss would be beneficial.  May also need further medication titration.  Medication Adjustments/Labs and Tests Ordered: Current medicines are reviewed at length with the patient today.  Concerns regarding medicines are outlined above.   Tests Ordered: Orders Placed This Encounter  Procedures  . CBC w/Diff/Platelet  . Basic Metabolic Panel (BMET)    Medication Changes: No orders of the defined types were placed in this encounter.   Disposition:  Follow up after procedure.  Signed, Satira Sark, MD, [facility] 12/10/2018 3:01 PM    [place] at [facility] [address] [place], [place] [address] Phone: [phone]; Fax: [phone]

## 2018-12-12 ENCOUNTER — Other Ambulatory Visit (HOSPITAL_COMMUNITY)
Admission: RE | Admit: 2018-12-12 | Discharge: 2018-12-12 | Disposition: A | Discharge status: Home / Self Care | Payer: Medicare Other | Source: Ambulatory Visit | Attending: Interventional Cardiology | Admitting: Interventional Cardiology

## 2018-12-12 ENCOUNTER — Other Ambulatory Visit: Payer: Self-pay

## 2018-12-12 ENCOUNTER — Other Ambulatory Visit (HOSPITAL_COMMUNITY)
Admission: RE | Admit: 2018-12-12 | Discharge: 2018-12-12 | Disposition: A | Discharge status: Home / Self Care | Payer: Medicare Other | Source: Ambulatory Visit | Attending: Cardiology | Admitting: Cardiology

## 2018-12-12 DIAGNOSIS — Z20828 Contact with and (suspected) exposure to other viral communicable diseases: Secondary | ICD-10-CM | POA: Diagnosis not present

## 2018-12-12 DIAGNOSIS — Z01812 Encounter for preprocedural laboratory examination: Secondary | ICD-10-CM | POA: Diagnosis present

## 2018-12-12 LAB — CBC WITH DIFFERENTIAL/PLATELET
Abs Immature Granulocytes: 0.03 10*3/uL (ref 0.00–0.07)
Basophils Absolute: 0.1 10*3/uL (ref 0.0–0.1)
Basophils Relative: 1 %
Eosinophils Absolute: 0.3 10*3/uL (ref 0.0–0.5)
Eosinophils Relative: 3 %
HCT: 50.2 % (ref 39.0–52.0)
Hemoglobin: 15.2 g/dL (ref 13.0–17.0)
Immature Granulocytes: 0 %
Lymphocytes Relative: 30 %
Lymphs Abs: 2.8 10*3/uL (ref 0.7–4.0)
MCH: 27.5 pg (ref 26.0–34.0)
MCHC: 30.3 g/dL (ref 30.0–36.0)
MCV: 90.9 fL (ref 80.0–100.0)
Monocytes Absolute: 0.6 10*3/uL (ref 0.1–1.0)
Monocytes Relative: 7 %
Neutro Abs: 5.5 10*3/uL (ref 1.7–7.7)
Neutrophils Relative %: 59 %
Platelets: 282 10*3/uL (ref 150–400)
RBC: 5.52 MIL/uL (ref 4.22–5.81)
RDW: 13.3 % (ref 11.5–15.5)
WBC: 9.4 10*3/uL (ref 4.0–10.5)
nRBC: 0 % (ref 0.0–0.2)

## 2018-12-12 LAB — BASIC METABOLIC PANEL
Anion gap: 11 (ref 5–15)
BUN: 15 mg/dL (ref 8–23)
CO2: 23 mmol/L (ref 22–32)
Calcium: 8.8 mg/dL — ABNORMAL LOW (ref 8.9–10.3)
Chloride: 108 mmol/L (ref 98–111)
Creatinine, Ser: 1 mg/dL (ref 0.61–1.24)
GFR calc Af Amer: >60 mL/min (ref >60)
GFR calc non Af Amer: >60 mL/min (ref >60)
Glucose, Bld: 178 mg/dL — ABNORMAL HIGH (ref 70–99)
Potassium: 4.2 mmol/L (ref 3.5–5.1)
Sodium: 142 mmol/L (ref 135–145)

## 2018-12-12 LAB — SARS CORONAVIRUS 2 (TAT 6-24 HRS): SARS Coronavirus 2: NEGATIVE

## 2018-12-15 ENCOUNTER — Other Ambulatory Visit: Payer: Self-pay | Admitting: Neurosurgery

## 2018-12-15 ENCOUNTER — Telehealth: Payer: Self-pay | Admitting: *Deleted

## 2018-12-15 DIAGNOSIS — Z981 Arthrodesis status: Secondary | ICD-10-CM

## 2018-12-15 NOTE — Telephone Encounter (Signed)
Pt contacted pre-catheterization scheduled at [facility] for: Tuesday December 16, 2018 9 AM Verified arrival time and place: [facility]) at: 7 AM   No solid food after midnight prior to cath, clear liquids until 5 AM day of procedure. Contrast allergy: no  Hold: Insulin pump-pt will manage. Metformin-day of procedure and 48 hours post procedure. Jardiance-AM of procedure.  Except hold medications AM meds can be  taken pre-cath with sip of water including: ASA 81 mg   Confirmed patient has responsible adult to drive home post procedure and observe 24 hours after arriving home: yes  Currently, due to Covid-19 pandemic, only one support person will be allowed with patient. Must be the same support person for that patient's entire stay, will be screened and required to wear a mask. They will be asked to wait in the waiting room for the duration of the patient's stay.  Patients are required to wear a mask when they enter the hospital.     COVID-19 Pre-Screening Questions:  . In the past 7 to 10 days have you had a cough,  shortness of breath, headache, congestion, fever (100 or greater) body aches, chills, sore throat, or sudden loss of taste or sense of smell? no . Have you been around anyone with known Covid 19? no . Have you been around anyone who is awaiting Covid 19 test results in the past 7 to 10 days? no . Have you been around anyone who has been exposed to Covid 19, or has mentioned symptoms of Covid 19 within the past 7 to 10 days? no  I reviewed procedure/mask/visitor instructions, Covid-19 screening questions with patient, he verbalized understanding, thanked me for call.

## 2018-12-16 ENCOUNTER — Other Ambulatory Visit: Payer: Self-pay

## 2018-12-16 ENCOUNTER — Encounter (HOSPITAL_COMMUNITY)
Admission: RE | Disposition: A | Discharge status: Home / Self Care | Payer: Self-pay | Source: Home / Self Care | Attending: Interventional Cardiology

## 2018-12-16 ENCOUNTER — Encounter (HOSPITAL_COMMUNITY): Payer: Self-pay | Admitting: Interventional Cardiology

## 2018-12-16 ENCOUNTER — Ambulatory Visit (HOSPITAL_COMMUNITY)
Admission: RE | Admit: 2018-12-16 | Discharge: 2018-12-16 | Disposition: A | Discharge status: Home / Self Care | Payer: Medicare Other | Attending: Interventional Cardiology | Admitting: Interventional Cardiology

## 2018-12-16 DIAGNOSIS — Z79899 Other long term (current) drug therapy: Secondary | ICD-10-CM | POA: Diagnosis not present

## 2018-12-16 DIAGNOSIS — R0609 Other forms of dyspnea: Secondary | ICD-10-CM | POA: Diagnosis present

## 2018-12-16 DIAGNOSIS — E1065 Type 1 diabetes mellitus with hyperglycemia: Secondary | ICD-10-CM | POA: Insufficient documentation

## 2018-12-16 DIAGNOSIS — E782 Mixed hyperlipidemia: Secondary | ICD-10-CM | POA: Diagnosis not present

## 2018-12-16 DIAGNOSIS — M1990 Unspecified osteoarthritis, unspecified site: Secondary | ICD-10-CM | POA: Diagnosis not present

## 2018-12-16 DIAGNOSIS — Z794 Long term (current) use of insulin: Secondary | ICD-10-CM | POA: Insufficient documentation

## 2018-12-16 DIAGNOSIS — I10 Essential (primary) hypertension: Secondary | ICD-10-CM | POA: Insufficient documentation

## 2018-12-16 DIAGNOSIS — I25110 Atherosclerotic heart disease of native coronary artery with unstable angina pectoris: Secondary | ICD-10-CM | POA: Insufficient documentation

## 2018-12-16 DIAGNOSIS — Z7982 Long term (current) use of aspirin: Secondary | ICD-10-CM | POA: Insufficient documentation

## 2018-12-16 DIAGNOSIS — Z9641 Presence of insulin pump (external) (internal): Secondary | ICD-10-CM | POA: Diagnosis not present

## 2018-12-16 DIAGNOSIS — I200 Unstable angina: Secondary | ICD-10-CM

## 2018-12-16 DIAGNOSIS — K219 Gastro-esophageal reflux disease without esophagitis: Secondary | ICD-10-CM | POA: Diagnosis not present

## 2018-12-16 DIAGNOSIS — E1040 Type 1 diabetes mellitus with diabetic neuropathy, unspecified: Secondary | ICD-10-CM | POA: Diagnosis not present

## 2018-12-16 HISTORY — PX: LEFT HEART CATH AND CORONARY ANGIOGRAPHY: CATH118249

## 2018-12-16 LAB — GLUCOSE, CAPILLARY: Glucose-Capillary: 166 mg/dL — ABNORMAL HIGH (ref 70–99)

## 2018-12-16 SURGERY — LEFT HEART CATH AND CORONARY ANGIOGRAPHY
Anesthesia: LOCAL

## 2018-12-16 MED ORDER — SODIUM CHLORIDE 0.9% FLUSH: 3.0000 mL | Freq: Two times a day (BID) | INTRAVENOUS | Status: DC

## 2018-12-16 MED ORDER — SODIUM CHLORIDE 0.9 % WEIGHT BASED INFUSION: 1.0000 mL/kg/h | INTRAVENOUS | Status: DC

## 2018-12-16 MED ORDER — SODIUM CHLORIDE 0.9 % WEIGHT BASED INFUSION
3.0000 mL/kg/h | INTRAVENOUS | Status: AC
  Administered 2018-12-16: 08:00:00 3 mL/kg/h via INTRAVENOUS

## 2018-12-16 MED ORDER — ACETAMINOPHEN 325 MG PO TABS: 650.0000 mg | ORAL_TABLET | ORAL | Status: DC | PRN

## 2018-12-16 MED ORDER — IOHEXOL 350 MG/ML SOLN
INTRAVENOUS | Status: DC | PRN
  Administered 2018-12-16: 65 mL

## 2018-12-16 MED ORDER — HYDRALAZINE HCL 20 MG/ML IJ SOLN: 10.0000 mg | INTRAMUSCULAR | Status: DC | PRN

## 2018-12-16 MED ORDER — SODIUM CHLORIDE 0.9% FLUSH: 3.0000 mL | INTRAVENOUS | Status: DC | PRN

## 2018-12-16 MED ORDER — MIDAZOLAM HCL 2 MG/2ML IJ SOLN
INTRAMUSCULAR | Status: AC
  Filled 2018-12-16: qty 2

## 2018-12-16 MED ORDER — ASPIRIN 81 MG PO CHEW: 81.0000 mg | CHEWABLE_TABLET | ORAL | Status: DC

## 2018-12-16 MED ORDER — VERAPAMIL HCL 2.5 MG/ML IV SOLN
INTRAVENOUS | Status: DC | PRN
  Administered 2018-12-16: 10 mL via INTRA_ARTERIAL

## 2018-12-16 MED ORDER — HEPARIN (PORCINE) IN NACL 1000-0.9 UT/500ML-% IV SOLN
INTRAVENOUS | Status: DC | PRN
  Administered 2018-12-16: 500 mL

## 2018-12-16 MED ORDER — SODIUM CHLORIDE 0.9 % IV SOLN: 250.0000 mL | INTRAVENOUS | Status: DC | PRN

## 2018-12-16 MED ORDER — FENTANYL CITRATE (PF) 100 MCG/2ML IJ SOLN
INTRAMUSCULAR | Status: AC
  Filled 2018-12-16: qty 2

## 2018-12-16 MED ORDER — HEPARIN (PORCINE) IN NACL 1000-0.9 UT/500ML-% IV SOLN
INTRAVENOUS | Status: AC
  Filled 2018-12-16: qty 1000

## 2018-12-16 MED ORDER — HEPARIN SODIUM (PORCINE) 1000 UNIT/ML IJ SOLN
INTRAMUSCULAR | Status: DC | PRN
  Administered 2018-12-16: 6000 [IU] via INTRAVENOUS

## 2018-12-16 MED ORDER — MIDAZOLAM HCL 2 MG/2ML IJ SOLN
INTRAMUSCULAR | Status: DC | PRN
  Administered 2018-12-16: 2 mg via INTRAVENOUS

## 2018-12-16 MED ORDER — SODIUM CHLORIDE 0.9 % IV SOLN: INTRAVENOUS | Status: DC

## 2018-12-16 MED ORDER — FENTANYL CITRATE (PF) 100 MCG/2ML IJ SOLN
INTRAMUSCULAR | Status: DC | PRN
  Administered 2018-12-16: 25 ug via INTRAVENOUS

## 2018-12-16 MED ORDER — LIDOCAINE HCL (PF) 1 % IJ SOLN
INTRAMUSCULAR | Status: DC | PRN
  Administered 2018-12-16: 2 mL

## 2018-12-16 MED ORDER — LABETALOL HCL 5 MG/ML IV SOLN: 10.0000 mg | INTRAVENOUS | Status: DC | PRN

## 2018-12-16 MED ORDER — VERAPAMIL HCL 2.5 MG/ML IV SOLN
INTRAVENOUS | Status: AC
  Filled 2018-12-16: qty 2

## 2018-12-16 MED ORDER — ONDANSETRON HCL 4 MG/2ML IJ SOLN: 4.0000 mg | Freq: Four times a day (QID) | INTRAMUSCULAR | Status: DC | PRN

## 2018-12-16 MED ORDER — LIDOCAINE HCL (PF) 1 % IJ SOLN
INTRAMUSCULAR | Status: AC
  Filled 2018-12-16: qty 30

## 2018-12-16 MED ORDER — METFORMIN HCL 1000 MG PO TABS: 1000.0000 mg | ORAL_TABLET | Freq: Two times a day (BID) | ORAL | 3 refills | Status: DC

## 2018-12-16 MED ORDER — HEPARIN SODIUM (PORCINE) 1000 UNIT/ML IJ SOLN
INTRAMUSCULAR | Status: AC
  Filled 2018-12-16: qty 1

## 2018-12-16 SURGICAL SUPPLY — 9 items

## 2018-12-16 NOTE — Discharge Instructions (Signed)
Radial Site Care ° °This sheet gives you information about how to care for yourself after your procedure. Your health care provider may also give you more specific instructions. If you have problems or questions, contact your health care provider. °What can I expect after the procedure? °After the procedure, it is common to have: °· Bruising and tenderness at the catheter insertion area. °Follow these instructions at home: °Medicines °· Take over-the-counter and prescription medicines only as told by your health care provider. °Insertion site care °· Follow instructions from your health care provider about how to take care of your insertion site. Make sure you: °? Wash your hands with soap and water before you change your bandage (dressing). If soap and water are not available, use hand sanitizer. °? Change your dressing as told by your health care provider. °? Leave stitches (sutures), skin glue, or adhesive strips in place. These skin closures may need to stay in place for 2 weeks or longer. If adhesive strip edges start to loosen and curl up, you may trim the loose edges. Do not remove adhesive strips completely unless your health care provider tells you to do that. °· Check your insertion site every day for signs of infection. Check for: °? Redness, swelling, or pain. °? Fluid or blood. °? Pus or a bad smell. °? Warmth. °· Do not take baths, swim, or use a hot tub until your health care provider approves. °· You may shower 24-48 hours after the procedure, or as directed by your health care provider. °? Remove the dressing and gently wash the site with plain soap and water. °? Pat the area dry with a clean towel. °? Do not rub the site. That could cause bleeding. °· Do not apply powder or lotion to the site. °Activity ° °· For 24 hours after the procedure, or as directed by your health care provider: °? Do not flex or bend the affected arm. °? Do not push or pull heavy objects with the affected arm. °? Do not  drive yourself home from the hospital or clinic. You may drive 24 hours after the procedure unless your health care provider tells you not to. °? Do not operate machinery or power tools. °· Do not lift anything that is heavier than 10 lb (4.5 kg), or the limit that you are told, until your health care provider says that it is safe. °· Ask your health care provider when it is okay to: °? Return to work or school. °? Resume usual physical activities or sports. °? Resume sexual activity. °General instructions °· If the catheter site starts to bleed, raise your arm and put firm pressure on the site. If the bleeding does not stop, get help right away. This is a medical emergency. °· If you went home on the same day as your procedure, a responsible adult should be with you for the first 24 hours after you arrive home. °· Keep all follow-up visits as told by your health care provider. This is important. °Contact a health care provider if: °· You have a fever. °· You have redness, swelling, or yellow drainage around your insertion site. °Get help right away if: °· You have unusual pain at the radial site. °· The catheter insertion area swells very fast. °· The insertion area is bleeding, and the bleeding does not stop when you hold steady pressure on the area. °· Your arm or hand becomes pale, cool, tingly, or numb. °These symptoms may represent a serious problem   that is an emergency. Do not wait to see if the symptoms will go away. Get medical help right away. Call your local emergency services (911 in the [place] Do not drive yourself to the hospital. °Summary °· After the procedure, it is common to have bruising and tenderness at the site. °· Follow instructions from your health care provider about how to take care of your radial site wound. Check the wound every day for signs of infection. °· Do not lift anything that is heavier than 10 lb (4.5 kg), or the limit that you are told, until your health care provider says  that it is safe. °This information is not intended to replace advice given to you by your health care provider. Make sure you discuss any questions you have with your health care provider. °Document Released: 01/27/2010 Document Revised: 01/30/2017 Document Reviewed: 01/30/2017 °Elsevier Patient Education © 2020 [employer] ° °

## 2018-12-16 NOTE — Interval H&P Note (Signed)
Cath Lab Visit (complete for each Cath Lab visit)  Clinical Evaluation Leading to the Procedure:   ACS: No.  Non-ACS:    Anginal Classification: CCS III  Anti-ischemic medical therapy: Minimal Therapy (1 class of medications)  Non-Invasive Test Results: No non-invasive testing performed  Prior CABG: No previous CABG   Unable to do CTA coronaries   History and Physical Interval Note:  12/16/2018 8:46 AM  David Mcdowell  has presented today for surgery, with the diagnosis of angina.  The various methods of treatment have been discussed with the patient and family. After consideration of risks, benefits and other options for treatment, the patient has consented to  Procedure(s): LEFT HEART CATH AND CORONARY ANGIOGRAPHY (N/A) as a surgical intervention.  The patient's history has been reviewed, patient examined, no change in status, stable for surgery.  I have reviewed the patient's chart and labs.  Questions were answered to the patient's satisfaction.     Larae Grooms

## 2018-12-19 ENCOUNTER — Ambulatory Visit: Payer: Medicare Other | Admitting: Cardiology

## 2018-12-24 ENCOUNTER — Other Ambulatory Visit: Payer: Self-pay

## 2018-12-24 ENCOUNTER — Other Ambulatory Visit: Payer: Self-pay | Admitting: Cardiology

## 2018-12-24 ENCOUNTER — Ambulatory Visit
Admission: RE | Admit: 2018-12-24 | Discharge: 2018-12-24 | Disposition: A | Discharge status: Home / Self Care | Payer: Medicare Other | Source: Ambulatory Visit | Attending: Neurosurgery | Admitting: Neurosurgery

## 2018-12-24 DIAGNOSIS — Z981 Arthrodesis status: Secondary | ICD-10-CM

## 2019-01-10 ENCOUNTER — Encounter (HOSPITAL_BASED_OUTPATIENT_CLINIC_OR_DEPARTMENT_OTHER): Payer: Self-pay | Admitting: Emergency Medicine

## 2019-01-10 ENCOUNTER — Emergency Department (HOSPITAL_BASED_OUTPATIENT_CLINIC_OR_DEPARTMENT_OTHER)
Admission: EM | Admit: 2019-01-10 | Discharge: 2019-01-10 | Disposition: A | Discharge status: Home / Self Care | Payer: Medicare PPO | Attending: Emergency Medicine | Admitting: Emergency Medicine

## 2019-01-10 ENCOUNTER — Other Ambulatory Visit: Payer: Self-pay

## 2019-01-10 ENCOUNTER — Emergency Department (HOSPITAL_BASED_OUTPATIENT_CLINIC_OR_DEPARTMENT_OTHER): Payer: Medicare PPO

## 2019-01-10 DIAGNOSIS — M25571 Pain in right ankle and joints of right foot: Secondary | ICD-10-CM | POA: Diagnosis not present

## 2019-01-10 DIAGNOSIS — Z5321 Procedure and treatment not carried out due to patient leaving prior to being seen by health care provider: Secondary | ICD-10-CM | POA: Insufficient documentation

## 2019-01-10 DIAGNOSIS — S99911A Unspecified injury of right ankle, initial encounter: Secondary | ICD-10-CM | POA: Diagnosis not present

## 2019-01-10 NOTE — ED Notes (Signed)
Per registration, pt stated he was leaving. LWBS after triage.

## 2019-01-10 NOTE — ED Triage Notes (Addendum)
Fell approximately 5 feet off of a ladder, landing on his stomach on the end table. C/o R ankle pain, denies LOC. No abd tenderness noted. Denies neck or back pain. He took his home dilaudid PTA.

## 2019-01-11 DIAGNOSIS — S86011A Strain of right Achilles tendon, initial encounter: Secondary | ICD-10-CM | POA: Diagnosis not present

## 2019-01-12 ENCOUNTER — Ambulatory Visit: Payer: Medicare Other | Admitting: Cardiology

## 2019-01-12 DIAGNOSIS — S86011D Strain of right Achilles tendon, subsequent encounter: Secondary | ICD-10-CM | POA: Diagnosis not present

## 2019-01-13 DIAGNOSIS — Z6838 Body mass index (BMI) 38.0-38.9, adult: Secondary | ICD-10-CM | POA: Diagnosis not present

## 2019-01-13 DIAGNOSIS — I10 Essential (primary) hypertension: Secondary | ICD-10-CM | POA: Diagnosis not present

## 2019-01-13 DIAGNOSIS — Z981 Arthrodesis status: Secondary | ICD-10-CM | POA: Diagnosis not present

## 2019-01-13 DIAGNOSIS — M47816 Spondylosis without myelopathy or radiculopathy, lumbar region: Secondary | ICD-10-CM | POA: Diagnosis not present

## 2019-01-14 ENCOUNTER — Ambulatory Visit: Payer: Medicare PPO | Admitting: Cardiology

## 2019-01-14 ENCOUNTER — Encounter: Payer: Self-pay | Admitting: Cardiology

## 2019-01-14 ENCOUNTER — Other Ambulatory Visit: Payer: Self-pay

## 2019-01-14 VITALS — BP 124/70 | HR 94 | Temp 97.1°F | Ht 72.0 in | Wt 288.0 lb

## 2019-01-14 DIAGNOSIS — E782 Mixed hyperlipidemia: Secondary | ICD-10-CM

## 2019-01-14 DIAGNOSIS — E1042 Type 1 diabetes mellitus with diabetic polyneuropathy: Secondary | ICD-10-CM | POA: Diagnosis not present

## 2019-01-14 DIAGNOSIS — I2510 Atherosclerotic heart disease of native coronary artery without angina pectoris: Secondary | ICD-10-CM

## 2019-01-14 DIAGNOSIS — I10 Essential (primary) hypertension: Secondary | ICD-10-CM | POA: Diagnosis not present

## 2019-01-14 NOTE — Patient Instructions (Signed)
Medication Instructions:  Your physician recommends that you continue on your current medications as directed. Please refer to the Current Medication list given to you today.  *If you need a refill on your cardiac medications before your next appointment, please call your pharmacy*  Lab Work: NONE   If you have labs (blood work) drawn today and your tests are completely normal, you will receive your results only by: Marland Kitchen MyChart Message (if you have MyChart) OR . A paper copy in the mail If you have any lab test that is abnormal or we need to change your treatment, we will call you to review the results.  Testing/Procedures: NONE   Follow-Up: At [facility], you and your health needs are our priority.  As part of our continuing mission to provide you with exceptional heart care, we have created designated Provider Care Teams.  These Care Teams include your primary Cardiologist (physician) and Advanced Practice Providers (APPs -  Physician Assistants and Nurse Practitioners) who all work together to provide you with the care you need, when you need it.  Your next appointment:   1 year(s)  The format for your next appointment:   In Person  Provider:   Rozann Lesches, MD  Other Instructions Thank you for choosing [facility]!

## 2019-01-14 NOTE — Progress Notes (Signed)
Cardiology Office Note  Date: 01/14/2019   ID: David Mcdowell, DOB [birth year], MRN [id number]  PCP:  David Sites, MD  Cardiologist:  Rozann Lesches, MD Electrophysiologist:  None   Chief Complaint  Patient presents with  . Cardiac follow-up    History of Present Illness: David Mcdowell is a 64 y.o. male last seen in December 2020.  He presents now for follow-up of a diagnostic cardiac catheterization that fortunately revealed only mild, nonobstructive coronary atherosclerosis.  He states that he feels reassured, reviewed the results himself in My Chart.  I reviewed his medications.  Goal is continued risk factor modification, I discussed this with him.  He is on aspirin, insulin, Jardiance, Glucophage, lisinopril, Toprol-XL, and Pravachol.  Activity is limited by peripheral neuropathy.  Unfortunately he also had a recent injury, fell off a ladder and sustained tendon injury in his right lower leg. He has a protective boot in place and is following with orthopedics, does not anticipate surgery.  Past Medical History:  Diagnosis Date  . Abscess   . Anxiety   . Arthritis   . Chronic back pain   . Coronary atherosclerosis    Mild at cardiac catheterization December 2020  . Essential hypertension   . GERD (gastroesophageal reflux disease)   . History of kidney stones   . Mixed hyperlipidemia   . Myositis   . Neuropathy   . Recurrent kidney stones   . Type 1 diabetes mellitus (HCC)    Insulin pump    Past Surgical History:  Procedure Laterality Date  . ABDOMINAL EXPOSURE N/A 07/25/2016   Procedure: ABDOMINAL EXPOSURE;  Surgeon: Rosetta Posner, MD;  Location: [facility];  Service: Vascular;  Laterality: N/A;  . ANTERIOR LUMBAR FUSION N/A 07/25/2016   Procedure: LUMBAR FOUR-FIVE ANTERIOR LUMBAR INTERBODY FUSION;  Surgeon: Eustace Moore, MD;  Location: [facility] [place];  Service: Neurosurgery;  Laterality: N/A;  . BACK SURGERY    . CARPAL TUNNEL RELEASE    . DORSAL COMPARTMENT RELEASE  Left 12/22/2015   Procedure: RELEASE DORSAL COMPARTMENT (DEQUERVAIN), left wrist;  Surgeon: [name], MD;  Location: [facility];  Service: Orthopedics;  Laterality: Left;  block  . ELBOW SURGERY     both tendon repairs( both elbows)  . INCISION AND DRAINAGE ABSCESS Right 02/06/2014   Procedure: INCISION AND DRAINAGE ABSCESS right groin abcess debridement skin and subqutaneous tissue;  Surgeon: Excell Seltzer, MD;  Location: [facility];  Service: General;  Laterality: Right;  . LEFT HEART CATH AND CORONARY ANGIOGRAPHY N/A 12/16/2018   Procedure: LEFT HEART CATH AND CORONARY ANGIOGRAPHY;  Surgeon: Jettie Booze, MD;  Location: [facility];  Service: Cardiovascular;  Laterality: N/A;  . STERIOD INJECTION Right 12/22/2015   Procedure: STEROID INJECTION;  Surgeon: [name], MD;  Location: [facility];  Service: Orthopedics;  Laterality: Right;  . TOTAL KNEE ARTHROPLASTY Left 01/12/2015   Procedure: LEFT TOTAL KNEE ARTHROPLASTY;  Surgeon: [name], MD;  Location: [facility];  Service: Orthopedics;  Laterality: Left;    Current Outpatient Medications  Medication Sig Dispense Refill  . amitriptyline (ELAVIL) 25 MG tablet Take 25 mg by mouth at bedtime.    Marland Kitchen aspirin EC 81 MG tablet Take 81 mg by mouth daily.    . baclofen (LIORESAL) 10 MG tablet Take 10 mg by mouth 3 (three) times daily as needed for muscle spasms.   0  . Carboxymethylcellul-Glycerin (LUBRICATING EYE DROPS OP) Place 1  drop into both eyes daily as needed (dry eyes).    . colestipol (COLESTID) 1 g tablet Take 1 tablet (1 g total) by mouth 2 (two) times daily. 60 tablet 3  . diphenoxylate-atropine (LOMOTIL) 2.5-0.025 MG tablet Take 1-2 tablets by mouth 4 (four) times daily as needed for diarrhea or loose stools. 30 tablet 0  . gabapentin (NEURONTIN) 300 MG capsule Take 300-600 mg by mouth See admin instructions. Take 300 mg in the morning and 600 mg at night    . HUMULIN R 500 UNIT/ML  injection by Continuous infusion (non-IV) route continuous.     Marland Kitchen HYDROmorphone (DILAUDID) 4 MG tablet Take 4-8 mg by mouth at bedtime as needed for severe pain.     . hyoscyamine (LEVSIN SL) 0.125 MG SL tablet Place 1 tablet (0.125 mg total) under the tongue every 4 (four) hours as needed for cramping (abdominal pain). 30 tablet 0  . Insulin Human (INSULIN PUMP) SOLN Inject into the skin daily. Insulin Concentrated (Humulin R-500) unit/ml    . JARDIANCE 25 MG TABS tablet Take 25 mg by mouth daily.    . Lidocaine 5 % CREA Apply 1 application topically daily as needed (muscle pain).    Marland Kitchen lisinopril (ZESTRIL) 40 MG tablet TAKE ONE TABLET BY MOUTH DAILY. (Patient taking differently: Take 40 mg by mouth daily. ) 90 tablet 3  . metFORMIN (GLUCOPHAGE) 1000 MG tablet Take 1 tablet (1,000 mg total) by mouth 2 (two) times daily.  3  . metoprolol succinate (TOPROL-XL) 50 MG 24 hr tablet TAKE ONE TABLET BY MOUTH DAILY. TAKE WITH OR IMMEDIATELY FOLLOWING A MEAL. (Patient taking differently: Take 50 mg by mouth daily. ) 90 tablet 3  . montelukast (SINGULAIR) 10 MG tablet Take 10 mg by mouth daily as needed (allergies).   2  . omeprazole (PRILOSEC) 20 MG capsule Take 1 capsule (20 mg total) by mouth 2 (two) times daily. 60 capsule 3  . ondansetron (ZOFRAN) 4 MG tablet Take 1 tablet (4 mg total) by mouth every 6 (six) hours as needed for nausea or vomiting. 20 tablet 0  . pravastatin (PRAVACHOL) 80 MG tablet TAKE ONE TABLET BY MOUTH DAILY. 90 tablet 1  . VITAMIN D PO Take 1 capsule by mouth daily.     No current facility-administered medications for this visit.   Allergies:  Patient has no known allergies.   Social History: The patient  reports that he has never smoked. He has never used smokeless tobacco. He reports that he does not drink alcohol or use drugs.   ROS:  Please see the history of present illness. Otherwise, complete review of systems is positive for chronic pain.  All other systems are reviewed  and negative.   Physical Exam: VS:  BP 124/70   Pulse 94   Temp (!) 97.1 F (36.2 C) (Temporal)   Ht 6' (1.829 m)   Wt 288 lb (130.6 kg)   SpO2 97%   BMI 39.06 kg/m , BMI Body mass index is 39.06 kg/m.  Wt Readings from Last 3 Encounters:  01/14/19 288 lb (130.6 kg)  01/10/19 285 lb (129.3 kg)  12/16/18 285 lb (129.3 kg)    General: Patient appears comfortable at rest.  Using crutches. HEENT: Conjunctiva and lids normal, wearing a mask. Neck: Supple, no elevated JVP, no thyromegaly. Lungs: Clear to auscultation, nonlabored breathing at rest. Cardiac: Regular rate and rhythm, no S3 or significant systolic murmur, no pericardial rub. Extremities: Protective boot in place on right foot  and lower leg. Skin: Warm and dry.  ECG:  An ECG dated 12/25/2017 was personally reviewed today and demonstrated:  Sinus rhythm with PVC and nonspecific T wave changes.  Recent Labwork: 10/06/2018: ALT 31; AST 31 12/12/2018: BUN 15; Creatinine, Ser 1.00; Hemoglobin 15.2; Platelets 282; Potassium 4.2; Sodium 142  December 2019: Cholesterol 131, triglycerides 107, HDL 45, LDL 65  Other Studies Reviewed Today:  Cardiac catheterization 12/16/2018:  1st Mrg lesion is 25% stenosed.  Mid RCA lesion is 20% stenosed.  The left ventricular systolic function is normal.  LV end diastolic pressure is mildly elevated.  The left ventricular ejection fraction is 50-55% by visual estimate.  There is no aortic valve stenosis.   Nonobstructive, mild CAD.   May benefit from diuretic given elevated LVEDP.  Stressed importance of risk factor modification to prevent progression of CAD.   Echocardiogram 03/30/2013: Study Conclusions   - Left ventricle: The cavity size was normal. Systolic  function was normal. The estimated ejection fraction was  in the range of 55% to 60%. Doppler parameters are  consistent with abnormal left ventricular relaxation  (grade 1 diastolic dysfunction). Mild to  moderate left  ventricular hypertrophy.  - Aortic valve: Mildly thickened, mildly calcified leaflets.  There was no stenosis.  - Mitral valve: Calcified annulus. Mildly thickened leaflets  . No significant regurgitation.   Assessment and Plan:  1.  History of dyspnea exertion and fatigue.  Cardiac catheterization in December 2020 showed mild, nonobstructive coronary atherosclerosis.  Plan is medical therapy and risk factor modification.  Current regimen includes aspirin, beta-blocker, ACE inhibitor, and statin.  He continues to work on glucose control as well per endocrinology, also on Jardiance.  2.  Mixed hyperlipidemia.  He remains on high-dose Pravachol.  Goal LDL under 70.  3.  Type 1 diabetes mellitus.  He remains on insulin, Glucophage, and Jardiance.  He follows with endocrinology.  4.  Essential hypertension.  On Toprol-XL and lisinopril.  Blood pressure is well controlled.  No changes were made.  Medication Adjustments/Labs and Tests Ordered: Current medicines are reviewed at length with the patient today.  Concerns regarding medicines are outlined above.   Tests Ordered: No orders of the defined types were placed in this encounter.   Medication Changes: No orders of the defined types were placed in this encounter.   Disposition:  Follow up 1 year in the [place] office, sooner if needed.  Signed, Satira Sark, MD, [facility] 01/14/2019 1:49 PM    [place] at [facility] [address], [place], [place] [address] Phone: [phone]; Fax: [phone]

## 2019-01-15 DIAGNOSIS — H4312 Vitreous hemorrhage, left eye: Secondary | ICD-10-CM | POA: Diagnosis not present

## 2019-01-15 DIAGNOSIS — E113513 Type 2 diabetes mellitus with proliferative diabetic retinopathy with macular edema, bilateral: Secondary | ICD-10-CM | POA: Diagnosis not present

## 2019-01-15 DIAGNOSIS — H3563 Retinal hemorrhage, bilateral: Secondary | ICD-10-CM | POA: Diagnosis not present

## 2019-01-15 DIAGNOSIS — H3582 Retinal ischemia: Secondary | ICD-10-CM | POA: Diagnosis not present

## 2019-02-02 ENCOUNTER — Other Ambulatory Visit: Payer: Self-pay | Admitting: Gastroenterology

## 2019-02-09 DIAGNOSIS — E1165 Type 2 diabetes mellitus with hyperglycemia: Secondary | ICD-10-CM | POA: Diagnosis not present

## 2019-02-11 DIAGNOSIS — S86011D Strain of right Achilles tendon, subsequent encounter: Secondary | ICD-10-CM | POA: Diagnosis not present

## 2019-02-11 DIAGNOSIS — Z6839 Body mass index (BMI) 39.0-39.9, adult: Secondary | ICD-10-CM | POA: Diagnosis not present

## 2019-02-25 DIAGNOSIS — M7061 Trochanteric bursitis, right hip: Secondary | ICD-10-CM | POA: Diagnosis not present

## 2019-02-25 DIAGNOSIS — Z981 Arthrodesis status: Secondary | ICD-10-CM | POA: Diagnosis not present

## 2019-02-25 DIAGNOSIS — M47816 Spondylosis without myelopathy or radiculopathy, lumbar region: Secondary | ICD-10-CM | POA: Diagnosis not present

## 2019-03-04 ENCOUNTER — Other Ambulatory Visit: Payer: Self-pay | Admitting: Neurosurgery

## 2019-03-04 DIAGNOSIS — M7061 Trochanteric bursitis, right hip: Secondary | ICD-10-CM

## 2019-03-18 ENCOUNTER — Ambulatory Visit
Admission: RE | Admit: 2019-03-18 | Discharge: 2019-03-18 | Disposition: A | Discharge status: Home / Self Care | Payer: Medicare PPO | Source: Ambulatory Visit | Attending: Neurosurgery | Admitting: Neurosurgery

## 2019-03-18 ENCOUNTER — Other Ambulatory Visit: Payer: Self-pay

## 2019-03-18 DIAGNOSIS — M25551 Pain in right hip: Secondary | ICD-10-CM | POA: Diagnosis not present

## 2019-03-18 DIAGNOSIS — M7061 Trochanteric bursitis, right hip: Secondary | ICD-10-CM

## 2019-03-25 DIAGNOSIS — Z981 Arthrodesis status: Secondary | ICD-10-CM | POA: Diagnosis not present

## 2019-04-17 DIAGNOSIS — R419 Unspecified symptoms and signs involving cognitive functions and awareness: Secondary | ICD-10-CM | POA: Diagnosis not present

## 2019-04-18 DIAGNOSIS — R419 Unspecified symptoms and signs involving cognitive functions and awareness: Secondary | ICD-10-CM | POA: Diagnosis not present

## 2019-04-19 DIAGNOSIS — R419 Unspecified symptoms and signs involving cognitive functions and awareness: Secondary | ICD-10-CM | POA: Diagnosis not present

## 2019-04-20 DIAGNOSIS — R419 Unspecified symptoms and signs involving cognitive functions and awareness: Secondary | ICD-10-CM | POA: Diagnosis not present

## 2019-04-23 DIAGNOSIS — H4312 Vitreous hemorrhage, left eye: Secondary | ICD-10-CM | POA: Diagnosis not present

## 2019-04-23 DIAGNOSIS — H3563 Retinal hemorrhage, bilateral: Secondary | ICD-10-CM | POA: Diagnosis not present

## 2019-04-23 DIAGNOSIS — H3582 Retinal ischemia: Secondary | ICD-10-CM | POA: Diagnosis not present

## 2019-04-23 DIAGNOSIS — E113513 Type 2 diabetes mellitus with proliferative diabetic retinopathy with macular edema, bilateral: Secondary | ICD-10-CM | POA: Diagnosis not present

## 2019-04-25 ENCOUNTER — Other Ambulatory Visit: Payer: Self-pay | Admitting: Cardiology

## 2019-04-30 DIAGNOSIS — E1165 Type 2 diabetes mellitus with hyperglycemia: Secondary | ICD-10-CM | POA: Diagnosis not present

## 2019-05-07 DIAGNOSIS — G894 Chronic pain syndrome: Secondary | ICD-10-CM | POA: Diagnosis not present

## 2019-05-07 DIAGNOSIS — M961 Postlaminectomy syndrome, not elsewhere classified: Secondary | ICD-10-CM | POA: Insufficient documentation

## 2019-05-12 DIAGNOSIS — M5416 Radiculopathy, lumbar region: Secondary | ICD-10-CM | POA: Insufficient documentation

## 2019-05-26 DIAGNOSIS — H25013 Cortical age-related cataract, bilateral: Secondary | ICD-10-CM | POA: Diagnosis not present

## 2019-05-26 DIAGNOSIS — E103512 Type 1 diabetes mellitus with proliferative diabetic retinopathy with macular edema, left eye: Secondary | ICD-10-CM | POA: Diagnosis not present

## 2019-05-26 DIAGNOSIS — H524 Presbyopia: Secondary | ICD-10-CM | POA: Diagnosis not present

## 2019-05-28 DIAGNOSIS — Z1152 Encounter for screening for COVID-19: Secondary | ICD-10-CM | POA: Diagnosis not present

## 2019-06-03 DIAGNOSIS — M961 Postlaminectomy syndrome, not elsewhere classified: Secondary | ICD-10-CM | POA: Diagnosis not present

## 2019-06-03 DIAGNOSIS — G894 Chronic pain syndrome: Secondary | ICD-10-CM | POA: Diagnosis not present

## 2019-06-10 ENCOUNTER — Other Ambulatory Visit: Payer: Self-pay | Admitting: Gastroenterology

## 2019-06-15 DIAGNOSIS — Z9689 Presence of other specified functional implants: Secondary | ICD-10-CM | POA: Insufficient documentation

## 2019-07-02 DIAGNOSIS — Z9641 Presence of insulin pump (external) (internal): Secondary | ICD-10-CM | POA: Diagnosis not present

## 2019-07-02 DIAGNOSIS — E1165 Type 2 diabetes mellitus with hyperglycemia: Secondary | ICD-10-CM | POA: Diagnosis not present

## 2019-07-02 DIAGNOSIS — E11319 Type 2 diabetes mellitus with unspecified diabetic retinopathy without macular edema: Secondary | ICD-10-CM | POA: Diagnosis not present

## 2019-07-02 DIAGNOSIS — F419 Anxiety disorder, unspecified: Secondary | ICD-10-CM | POA: Diagnosis not present

## 2019-07-02 DIAGNOSIS — I10 Essential (primary) hypertension: Secondary | ICD-10-CM | POA: Diagnosis not present

## 2019-07-02 DIAGNOSIS — E7800 Pure hypercholesterolemia, unspecified: Secondary | ICD-10-CM | POA: Diagnosis not present

## 2019-07-02 DIAGNOSIS — G609 Hereditary and idiopathic neuropathy, unspecified: Secondary | ICD-10-CM | POA: Diagnosis not present

## 2019-07-03 DIAGNOSIS — Z1389 Encounter for screening for other disorder: Secondary | ICD-10-CM | POA: Diagnosis not present

## 2019-07-03 DIAGNOSIS — G894 Chronic pain syndrome: Secondary | ICD-10-CM | POA: Diagnosis not present

## 2019-07-03 DIAGNOSIS — Z6837 Body mass index (BMI) 37.0-37.9, adult: Secondary | ICD-10-CM | POA: Diagnosis not present

## 2019-07-03 DIAGNOSIS — I10 Essential (primary) hypertension: Secondary | ICD-10-CM | POA: Diagnosis not present

## 2019-07-03 DIAGNOSIS — E8881 Metabolic syndrome: Secondary | ICD-10-CM | POA: Diagnosis not present

## 2019-07-03 DIAGNOSIS — Z0000 Encounter for general adult medical examination without abnormal findings: Secondary | ICD-10-CM | POA: Diagnosis not present

## 2019-07-20 DIAGNOSIS — E1165 Type 2 diabetes mellitus with hyperglycemia: Secondary | ICD-10-CM | POA: Diagnosis not present

## 2019-07-24 DIAGNOSIS — Z9689 Presence of other specified functional implants: Secondary | ICD-10-CM | POA: Diagnosis not present

## 2019-07-24 DIAGNOSIS — M961 Postlaminectomy syndrome, not elsewhere classified: Secondary | ICD-10-CM | POA: Diagnosis not present

## 2019-07-24 DIAGNOSIS — M5416 Radiculopathy, lumbar region: Secondary | ICD-10-CM | POA: Diagnosis not present

## 2019-07-30 DIAGNOSIS — E113513 Type 2 diabetes mellitus with proliferative diabetic retinopathy with macular edema, bilateral: Secondary | ICD-10-CM | POA: Diagnosis not present

## 2019-07-30 DIAGNOSIS — H3582 Retinal ischemia: Secondary | ICD-10-CM | POA: Diagnosis not present

## 2019-08-06 DIAGNOSIS — Z6839 Body mass index (BMI) 39.0-39.9, adult: Secondary | ICD-10-CM | POA: Diagnosis not present

## 2019-08-06 DIAGNOSIS — G894 Chronic pain syndrome: Secondary | ICD-10-CM | POA: Diagnosis not present

## 2019-08-06 DIAGNOSIS — G4700 Insomnia, unspecified: Secondary | ICD-10-CM | POA: Diagnosis not present

## 2019-08-06 DIAGNOSIS — I10 Essential (primary) hypertension: Secondary | ICD-10-CM | POA: Diagnosis not present

## 2019-08-06 DIAGNOSIS — E8881 Metabolic syndrome: Secondary | ICD-10-CM | POA: Diagnosis not present

## 2019-08-18 DIAGNOSIS — G4730 Sleep apnea, unspecified: Secondary | ICD-10-CM | POA: Diagnosis not present

## 2019-08-27 ENCOUNTER — Other Ambulatory Visit: Payer: Self-pay

## 2019-08-27 ENCOUNTER — Other Ambulatory Visit: Payer: Medicare PPO

## 2019-08-27 DIAGNOSIS — Z20822 Contact with and (suspected) exposure to covid-19: Secondary | ICD-10-CM | POA: Diagnosis not present

## 2019-08-28 LAB — NOVEL CORONAVIRUS, NAA: SARS-CoV-2, NAA: NOT DETECTED

## 2019-08-28 LAB — SARS-COV-2, NAA 2 DAY TAT

## 2019-10-15 DIAGNOSIS — E1165 Type 2 diabetes mellitus with hyperglycemia: Secondary | ICD-10-CM | POA: Diagnosis not present

## 2019-10-22 ENCOUNTER — Other Ambulatory Visit: Payer: Self-pay | Admitting: Cardiology

## 2019-10-30 DIAGNOSIS — M961 Postlaminectomy syndrome, not elsewhere classified: Secondary | ICD-10-CM | POA: Diagnosis not present

## 2019-10-30 DIAGNOSIS — Z79899 Other long term (current) drug therapy: Secondary | ICD-10-CM | POA: Diagnosis not present

## 2019-10-30 DIAGNOSIS — Z9689 Presence of other specified functional implants: Secondary | ICD-10-CM | POA: Diagnosis not present

## 2019-11-05 DIAGNOSIS — H3582 Retinal ischemia: Secondary | ICD-10-CM | POA: Diagnosis not present

## 2019-11-05 DIAGNOSIS — E113513 Type 2 diabetes mellitus with proliferative diabetic retinopathy with macular edema, bilateral: Secondary | ICD-10-CM | POA: Diagnosis not present

## 2019-11-05 DIAGNOSIS — H43811 Vitreous degeneration, right eye: Secondary | ICD-10-CM | POA: Diagnosis not present

## 2019-11-16 ENCOUNTER — Other Ambulatory Visit: Payer: Self-pay | Admitting: Gastroenterology

## 2019-11-16 DIAGNOSIS — Z23 Encounter for immunization: Secondary | ICD-10-CM | POA: Diagnosis not present

## 2019-12-15 ENCOUNTER — Other Ambulatory Visit: Payer: Self-pay | Admitting: Cardiology

## 2019-12-30 DIAGNOSIS — E1165 Type 2 diabetes mellitus with hyperglycemia: Secondary | ICD-10-CM | POA: Diagnosis not present

## 2020-01-04 DIAGNOSIS — E1165 Type 2 diabetes mellitus with hyperglycemia: Secondary | ICD-10-CM | POA: Diagnosis not present

## 2020-01-18 DIAGNOSIS — Z23 Encounter for immunization: Secondary | ICD-10-CM | POA: Diagnosis not present

## 2020-01-27 DIAGNOSIS — G894 Chronic pain syndrome: Secondary | ICD-10-CM | POA: Diagnosis not present

## 2020-01-27 DIAGNOSIS — L821 Other seborrheic keratosis: Secondary | ICD-10-CM | POA: Diagnosis not present

## 2020-01-27 DIAGNOSIS — Z9689 Presence of other specified functional implants: Secondary | ICD-10-CM | POA: Diagnosis not present

## 2020-01-27 DIAGNOSIS — M961 Postlaminectomy syndrome, not elsewhere classified: Secondary | ICD-10-CM | POA: Diagnosis not present

## 2020-01-27 DIAGNOSIS — L814 Other melanin hyperpigmentation: Secondary | ICD-10-CM | POA: Diagnosis not present

## 2020-01-27 DIAGNOSIS — D225 Melanocytic nevi of trunk: Secondary | ICD-10-CM | POA: Diagnosis not present

## 2020-02-24 DIAGNOSIS — Z9689 Presence of other specified functional implants: Secondary | ICD-10-CM | POA: Diagnosis not present

## 2020-02-24 DIAGNOSIS — M961 Postlaminectomy syndrome, not elsewhere classified: Secondary | ICD-10-CM | POA: Diagnosis not present

## 2020-02-25 DIAGNOSIS — H2513 Age-related nuclear cataract, bilateral: Secondary | ICD-10-CM | POA: Diagnosis not present

## 2020-02-25 DIAGNOSIS — H3582 Retinal ischemia: Secondary | ICD-10-CM | POA: Diagnosis not present

## 2020-02-25 DIAGNOSIS — E113513 Type 2 diabetes mellitus with proliferative diabetic retinopathy with macular edema, bilateral: Secondary | ICD-10-CM | POA: Diagnosis not present

## 2020-02-25 DIAGNOSIS — H43811 Vitreous degeneration, right eye: Secondary | ICD-10-CM | POA: Diagnosis not present

## 2020-02-26 DIAGNOSIS — E1165 Type 2 diabetes mellitus with hyperglycemia: Secondary | ICD-10-CM | POA: Diagnosis not present

## 2020-02-26 DIAGNOSIS — E119 Type 2 diabetes mellitus without complications: Secondary | ICD-10-CM | POA: Diagnosis not present

## 2020-03-02 ENCOUNTER — Other Ambulatory Visit: Payer: Self-pay | Admitting: Gastroenterology

## 2020-03-16 NOTE — Progress Notes (Signed)
Cardiology Office Note  Date: 03/17/2020   ID: David Mcdowell, DOB [birth year], MRN [id number]  PCP:  Jake Samples, PA-C  Cardiologist:  Rozann Lesches, MD Electrophysiologist:  None   Chief Complaint  Patient presents with  . Cardiac follow-up    History of Present Illness: David Mcdowell is a 65 y.o. male last seen in January 2021.  He presents for a routine follow-up visit.  Overall doing well without any obvious angina symptoms.  He continues to follow closely with endocrinology, recent hemoglobin A1c down to 7.1%.  He states that chronic back pain has been much better since placement of spinal stimulator, has been able to cut back on analgesic medications.  Feels more mobile.  I personally reviewed his ECG today which shows sinus tachycardia with PVC, nonspecific T wave changes.  I reviewed his medications which are listed below.  His last LDL was well managed at 58 on high-dose Pravachol.   Past Medical History:  Diagnosis Date  . Abscess   . Anxiety   . Arthritis   . Chronic back pain   . Coronary atherosclerosis    Mild at cardiac catheterization December 2020  . Essential hypertension   . GERD (gastroesophageal reflux disease)   . History of kidney stones   . Mixed hyperlipidemia   . Myositis   . Neuropathy   . Recurrent kidney stones   . Type 1 diabetes mellitus (HCC)    Insulin pump    Past Surgical History:  Procedure Laterality Date  . ABDOMINAL EXPOSURE N/A 07/25/2016   Procedure: ABDOMINAL EXPOSURE;  Surgeon: Rosetta Posner, MD;  Location: [place];  Service: Vascular;  Laterality: N/A;  . ANTERIOR LUMBAR FUSION N/A 07/25/2016   Procedure: LUMBAR FOUR-FIVE ANTERIOR LUMBAR INTERBODY FUSION;  Surgeon: Eustace Moore, MD;  Location: [place];  Service: Neurosurgery;  Laterality: N/A;  . BACK SURGERY    . CARPAL TUNNEL RELEASE    . DORSAL COMPARTMENT RELEASE Left 12/22/2015   Procedure: RELEASE DORSAL COMPARTMENT (DEQUERVAIN), left wrist;  Surgeon:  [name], MD;  Location: [place];  Service: Orthopedics;  Laterality: Left;  block  . ELBOW SURGERY     both tendon repairs( both elbows)  . INCISION AND DRAINAGE ABSCESS Right 02/06/2014   Procedure: INCISION AND DRAINAGE ABSCESS right groin abcess debridement skin and subqutaneous tissue;  Surgeon: Excell Seltzer, MD;  Location: [facility];  Service: General;  Laterality: Right;  . LEFT HEART CATH AND CORONARY ANGIOGRAPHY N/A 12/16/2018   Procedure: LEFT HEART CATH AND CORONARY ANGIOGRAPHY;  Surgeon: Jettie Booze, MD;  Location: [facility];  Service: Cardiovascular;  Laterality: N/A;  . STERIOD INJECTION Right 12/22/2015   Procedure: STEROID INJECTION;  Surgeon: [name], MD;  Location: [place];  Service: Orthopedics;  Laterality: Right;  . TOTAL KNEE ARTHROPLASTY Left 01/12/2015   Procedure: LEFT TOTAL KNEE ARTHROPLASTY;  Surgeon: [name], MD;  Location: [place];  Service: Orthopedics;  Laterality: Left;    Current Outpatient Medications  Medication Sig Dispense Refill  . aspirin EC 81 MG tablet Take 81 mg by mouth daily.    . Carboxymethylcellul-Glycerin (LUBRICATING EYE DROPS OP) Place 1 drop into both eyes daily as needed (dry eyes).    . colestipol (COLESTID) 1 g tablet Take 1 tablet (1 g total) by mouth 2 (two) times daily. Please schedule an Office Visit for further refills. Thank you. 60 tablet 1  . Continuous  Blood Gluc Receiver ([place]) [place] See admin instructions.    . diphenoxylate-atropine (LOMOTIL) 2.5-0.025 MG tablet Take 1-2 tablets by mouth 4 (four) times daily as needed for diarrhea or loose stools. 30 tablet 0  . gabapentin (NEURONTIN) 300 MG capsule Take 300-600 mg by mouth See admin instructions. Take 300 mg in the morning and 600 mg at night    . HUMULIN R 500 UNIT/ML injection by Continuous infusion (non-IV) route continuous.     . hyoscyamine (LEVSIN SL) 0.125 MG SL tablet Place 1 tablet  (0.125 mg total) under the tongue every 4 (four) hours as needed for cramping (abdominal pain). 30 tablet 0  . Insulin Human (INSULIN PUMP) SOLN Inject into the skin daily. Insulin Concentrated (Humulin R-500) unit/ml    . JARDIANCE 25 MG TABS tablet Take 25 mg by mouth daily.    . Lidocaine 5 % CREA Apply 1 application topically daily as needed (muscle pain).    Marland Kitchen lisinopril (ZESTRIL) 40 MG tablet Take 1 tablet (40 mg total) by mouth daily. 90 tablet 1  . metFORMIN (GLUCOPHAGE) 1000 MG tablet Take 1 tablet (1,000 mg total) by mouth 2 (two) times daily.  3  . metoprolol succinate (TOPROL-XL) 50 MG 24 hr tablet TAKE ONE TABLET BY MOUTH DAILY. TAKE WITH OR IMMEDIATELY FOLLOWING A MEAL. (Patient taking differently: Take 50 mg by mouth daily.) 90 tablet 3  . montelukast (SINGULAIR) 10 MG tablet Take 10 mg by mouth daily as needed (allergies).   2  . omeprazole (PRILOSEC) 20 MG capsule TAKE ONE CAPSULE BY MOUTH TWICE DAILY. 60 capsule 3  . ondansetron (ZOFRAN) 4 MG tablet Take 1 tablet (4 mg total) by mouth every 6 (six) hours as needed for nausea or vomiting. 20 tablet 0  . Oxycodone HCl 10 MG TABS take 1 tablet by oral route  every 6-8 hours for severe pain    . pravastatin (PRAVACHOL) 80 MG tablet TAKE ONE TABLET BY MOUTH DAILY. 90 tablet 3  . traZODone (DESYREL) 150 MG tablet Take by mouth at bedtime.     No current facility-administered medications for this visit.   Allergies:  Patient has no known allergies.   ROS: No palpitations or syncope.  Physical Exam: VS:  BP 134/68   Pulse 97   Ht 6' (1.829 m)   Wt 285 lb (129.3 kg)   SpO2 96%   BMI 38.65 kg/m , BMI Body mass index is 38.65 kg/m.  Wt Readings from Last 3 Encounters:  03/17/20 285 lb (129.3 kg)  01/14/19 288 lb (130.6 kg)  01/10/19 285 lb (129.3 kg)    General: Patient appears comfortable at rest. HEENT: Conjunctiva and lids normal, wearing a mask. Neck: Supple, no elevated JVP or carotid bruits, no thyromegaly. Lungs:  Clear to auscultation, nonlabored breathing at rest. Cardiac: Regular rate and rhythm, no S3 or significant systolic murmur, no pericardial rub. Extremities: No pitting edema.  ECG:  An ECG dated 12/25/2017 was personally reviewed today and demonstrated:  Sinus rhythm with PVC and nonspecific T wave changes.  Recent Labwork:  No interval lab work for review today.  Other Studies Reviewed Today:  Cardiac catheterization 12/16/2018:  1st Mrg lesion is 25% stenosed.  Mid RCA lesion is 20% stenosed.  The left ventricular systolic function is normal.  LV end diastolic pressure is mildly elevated.  The left ventricular ejection fraction is 50-55% by visual estimate.  There is no aortic valve stenosis.  Nonobstructive, mild CAD.   May benefit  from diuretic given elevated LVEDP. Stressed importance of risk factor modification to prevent progression of CAD.   Echocardiogram 03/30/2013: Study Conclusions   - Left ventricle: The cavity size was normal. Systolic  function was normal. The estimated ejection fraction was  in the range of 55% to 60%. Doppler parameters are  consistent with abnormal left ventricular relaxation  (grade 1 diastolic dysfunction). Mild to moderate left  ventricular hypertrophy.  - Aortic valve: Mildly thickened, mildly calcified leaflets.  There was no stenosis.  - Mitral valve: Calcified annulus. Mildly thickened leaflets  . No significant regurgitation.   Assessment and Plan:  1.  Mild coronary atherosclerosis by cardiac catheterization in December 2020.  No active angina symptoms reported, ECG reviewed and stable.  Plan to continue medical therapy and risk factor modification.  Currently on aspirin, lisinopril, Toprol-XL, and Pravachol along with diabetes regimen per endocrinology.  2.  Mixed hyperlipidemia, tolerating high-dose Pravachol with last LDL 56.  3.  Type 1 diabetes mellitus.  He follows with Dr. Chalmers Cater.  Currently on  Glucophage, Jardiance, and insulin pump.  Recent hemoglobin A1c 7.1%.  Medication Adjustments/Labs and Tests Ordered: Current medicines are reviewed at length with the patient today.  Concerns regarding medicines are outlined above.   Tests Ordered: No orders of the defined types were placed in this encounter.   Medication Changes: No orders of the defined types were placed in this encounter.   Disposition:  Follow up 1 year.  Signed, Satira Sark, MD, [facility] 03/17/2020 1:50 PM    [facility] [address], [place], [place] [address] Phone: [phone]; Fax: [phone]

## 2020-03-17 ENCOUNTER — Encounter: Payer: Self-pay | Admitting: Cardiology

## 2020-03-17 ENCOUNTER — Ambulatory Visit: Payer: Medicare PPO | Admitting: Cardiology

## 2020-03-17 ENCOUNTER — Other Ambulatory Visit: Payer: Self-pay

## 2020-03-17 VITALS — BP 134/68 | HR 97 | Ht 72.0 in | Wt 285.0 lb

## 2020-03-17 DIAGNOSIS — E782 Mixed hyperlipidemia: Secondary | ICD-10-CM

## 2020-03-17 DIAGNOSIS — I2510 Atherosclerotic heart disease of native coronary artery without angina pectoris: Secondary | ICD-10-CM | POA: Diagnosis not present

## 2020-03-17 NOTE — Addendum Note (Signed)
Addended by: Berlinda Last on: 03/17/2020 04:02 PM   Modules accepted: Orders

## 2020-03-17 NOTE — Patient Instructions (Signed)
Medication Instructions:  Your physician recommends that you continue on your current medications as directed. Please refer to the Current Medication list given to you today.  *If you need a refill on your cardiac medications before your next appointment, please call your pharmacy*   Lab Work: None If you have labs (blood work) drawn today and your tests are completely normal, you will receive your results only by: Marland Kitchen MyChart Message (if you have MyChart) OR . A paper copy in the mail If you have any lab test that is abnormal or we need to change your treatment, we will call you to review the results.   Testing/Procedures: None   Follow-Up: At [facility], you and your health needs are our priority.  As part of our continuing mission to provide you with exceptional heart care, we have created designated Provider Care Teams.  These Care Teams include your primary Cardiologist (physician) and Advanced Practice Providers (APPs -  Physician Assistants and Nurse Practitioners) who all work together to provide you with the care you need, when you need it.  We recommend signing up for the patient portal called "MyChart".  Sign up information is provided on this After Visit Summary.  MyChart is used to connect with patients for Virtual Visits (Telemedicine).  Patients are able to view lab/test results, encounter notes, upcoming appointments, etc.  Non-urgent messages can be sent to your provider as well.   To learn more about what you can do with MyChart, go to [email or web address].    Your next appointment:   12 month(s)  The format for your next appointment:   In Person  Provider:   Rozann Lesches, MD   Other Instructions None

## 2020-03-24 DIAGNOSIS — E1165 Type 2 diabetes mellitus with hyperglycemia: Secondary | ICD-10-CM | POA: Diagnosis not present

## 2020-03-25 DIAGNOSIS — E119 Type 2 diabetes mellitus without complications: Secondary | ICD-10-CM | POA: Diagnosis not present

## 2020-03-25 DIAGNOSIS — E1165 Type 2 diabetes mellitus with hyperglycemia: Secondary | ICD-10-CM | POA: Diagnosis not present

## 2020-03-30 ENCOUNTER — Other Ambulatory Visit: Payer: Self-pay | Admitting: Cardiology

## 2020-04-15 DIAGNOSIS — Z20822 Contact with and (suspected) exposure to covid-19: Secondary | ICD-10-CM | POA: Diagnosis not present

## 2020-04-25 DIAGNOSIS — E119 Type 2 diabetes mellitus without complications: Secondary | ICD-10-CM | POA: Diagnosis not present

## 2020-04-25 DIAGNOSIS — E1165 Type 2 diabetes mellitus with hyperglycemia: Secondary | ICD-10-CM | POA: Diagnosis not present

## 2020-04-26 ENCOUNTER — Other Ambulatory Visit: Payer: Self-pay | Admitting: Gastroenterology

## 2020-05-06 ENCOUNTER — Other Ambulatory Visit: Payer: Self-pay | Admitting: Gastroenterology

## 2020-05-11 ENCOUNTER — Ambulatory Visit: Payer: Medicare PPO | Admitting: Nurse Practitioner

## 2020-05-11 ENCOUNTER — Encounter: Payer: Self-pay | Admitting: Nurse Practitioner

## 2020-05-11 VITALS — BP 116/68 | HR 88 | Ht 72.0 in | Wt 273.0 lb

## 2020-05-11 DIAGNOSIS — K219 Gastro-esophageal reflux disease without esophagitis: Secondary | ICD-10-CM | POA: Diagnosis not present

## 2020-05-11 DIAGNOSIS — R110 Nausea: Secondary | ICD-10-CM | POA: Diagnosis not present

## 2020-05-11 MED ORDER — ONDANSETRON HCL 4 MG PO TABS: 4.0000 mg | ORAL_TABLET | Freq: Four times a day (QID) | ORAL | 1 refills | Status: DC | PRN

## 2020-05-11 MED ORDER — COLESTIPOL HCL 1 G PO TABS: ORAL_TABLET | ORAL | 3 refills | Status: DC

## 2020-05-11 MED ORDER — OMEPRAZOLE 20 MG PO CPDR: 1.0000 | DELAYED_RELEASE_CAPSULE | Freq: Every day | ORAL | 3 refills | Status: DC

## 2020-05-11 NOTE — Patient Instructions (Signed)
If you are age 63 or younger, your body mass index should be between 19-25. Your Body mass index is 37.03 kg/m. If this is out of the aformentioned range listed, please consider follow up with your Primary Care Provider.   Your medications have been refilled. Follow up in one year or sooner if needed.  It was great seeing you today! Thank you for entrusting me with your care and choosing [facility].  Noralyn Pick, CRNP

## 2020-05-11 NOTE — Progress Notes (Signed)
Agree with assessment and plan as outlined.  

## 2020-05-11 NOTE — Progress Notes (Signed)
05/11/2020 RACHARD Mcdowell [id number] [birth year]   Chief Complaint: Medication refills  History of Present Illness: David Mcdowell. David Mcdowell is a 65 year old male with a past medical history of arthritis, chronic back pain, coronary atherosclerosis, hypertension, hyperlipidemia, kidney stones, diabetes mellitus type 1 and diarrhea predominant IBS.  He presents to our office today for his annual review.  His GERD symptoms are stable on Omeprazole 20 mg once daily.  He is diarrhea predominant irritable bowel which typically occurs there is a change in temperature and his surroundings.  He describes feeling well when he is inside his house when the air conditioning is on but if he walks outside when the temperature is rather warm he develops diarrhea.  He stated this has been his typical IBS pattern for many years.  He takes Colestid once or twice daily since prescribed by Dr. Havery Moros 10/2018 which has significantly controlled his diarrhea.  He typically passes a normal formed brown bowel movement most days.  No rectal bleeding or black stools.  He rarely has nausea but he would like a prescription for Zofran to have on hand if needed.  His most recent EGD was 11/12/2018 which identified a few fundic gland polyps.  Gastric biopsies were negative for H. pylori.  Duodenal biopsies were negative.  He underwent a colonoscopy 01/11/2017 which identified 2 3 mm hyperplastic polyps which were removed from the sigmoid and rectum.  Diverticulosis to the sigmoid colon was present.  Biopsies were negative for IBD or microscopic colitis.  He was advised to repeat a colonoscopy in 10 years.  He has chronic back pain for which he has a stimulator followed by pain management.  He takes aspirin 81 mg daily.  No other complaints at this time.   Current Outpatient Medications on File Prior to Visit  Medication Sig Dispense Refill  . aspirin EC 81 MG tablet Take 81 mg by mouth daily.    . Carboxymethylcellul-Glycerin  (LUBRICATING EYE DROPS OP) Place 1 drop into both eyes daily as needed (dry eyes).    . Continuous Blood Gluc Receiver (DEXCOM G6 RECEIVER) [place] See admin instructions.    . gabapentin (NEURONTIN) 300 MG capsule Take 300-600 mg by mouth See admin instructions. Take 300 mg in the morning and 600 mg at night    . HUMULIN R 500 UNIT/ML injection by Continuous infusion (non-IV) route continuous.     . Insulin Human (INSULIN PUMP) SOLN Inject into the skin daily. Insulin Concentrated (Humulin R-500) unit/ml    . JARDIANCE 25 MG TABS tablet Take 25 mg by mouth daily.    . Lidocaine 5 % CREA Apply 1 application topically daily as needed (muscle pain).    Marland Kitchen lisinopril (ZESTRIL) 40 MG tablet TAKE ONE TABLET BY MOUTH ONCE DAILY 90 tablet 3  . metFORMIN (GLUCOPHAGE) 1000 MG tablet Take 1 tablet (1,000 mg total) by mouth 2 (two) times daily.  3  . metoprolol succinate (TOPROL-XL) 50 MG 24 hr tablet TAKE ONE TABLET BY MOUTH DAILY. TAKE WITH OR IMMEDIATELY FOLLOWING A MEAL. (Patient taking differently: Take 50 mg by mouth daily.) 90 tablet 3  . montelukast (SINGULAIR) 10 MG tablet Take 10 mg by mouth daily as needed (allergies).   2  . Oxycodone HCl 10 MG TABS take 1 tablet by oral route  every 6-8 hours for severe pain    . pravastatin (PRAVACHOL) 80 MG tablet TAKE ONE TABLET BY MOUTH DAILY. 90 tablet 3  . traZODone (DESYREL) 150 MG tablet Take  by mouth at bedtime.     No current facility-administered medications on file prior to visit.   No Known Allergies  Current Medications, Allergies, Past Medical History, Past Surgical History, Family History and Social History were reviewed in [employer] record.   Review of Systems:   Constitutional: Negative for fever, sweats, chills or weight loss.  Respiratory: Negative for shortness of breath.   Cardiovascular: Negative for chest pain, palpitations and leg swelling.  Gastrointestinal: See HPI.  Musculoskeletal: + Back pain.   Neurological: Negative for dizziness, headaches or paresthesias.   Physical Exam: BP 116/68   Pulse 88   Ht 6' (1.829 m)   Wt 273 lb (123.8 kg)   SpO2 94%   BMI 37.03 kg/m  General:  65 year old male in no acute distress. Head: Normocephalic and atraumatic. Eyes: No scleral icterus. Conjunctiva pink . Ears: Normal auditory acuity. Mouth: Dentition intact. No ulcers or lesions.  Lungs: Clear throughout to auscultation. Heart: Regular rate and rhythm, no murmur. Abdomen: Soft, nontender and nondistended. No masses or hepatomegaly. Normal bowel sounds x 4 quadrants.  Rectal: Deferred.  Musculoskeletal: Symmetrical with no gross deformities. Extremities: No edema. Neurological: Alert oriented x 4. No focal deficits.  Psychological: Alert and cooperative. Normal mood and affect  Assessment and Recommendations:  61.  65 year old male with diarrhea predominant IBS well controlled on Colestid 1 g once or twice daily as needed. -Refill Colestid 1 g p.o. twice daily as needed -Patient to call our office if his symptoms worsen  2.  History of hyperplastic colon and rectal polyps. -Next colonoscopy due 01/2027  3.  GERD symptoms are stable on omeprazole 20 mg once daily -Continue omeprazole 20 mg once daily -Avoid weight gain  4.  Infrequent nausea.  No recent projectile vomiting episodes. -Zofran 4 mg 1 p.o. every 6 hours as needed -Schedule gastric emptying study if nausea/vomiting recurs  5.  Diabetes mellitus type 1 on Glucophage, Jardiance and insulin pump Patient to follow-up in the office in 1 year and as needed

## 2020-05-25 DIAGNOSIS — E1165 Type 2 diabetes mellitus with hyperglycemia: Secondary | ICD-10-CM | POA: Diagnosis not present

## 2020-05-25 DIAGNOSIS — E119 Type 2 diabetes mellitus without complications: Secondary | ICD-10-CM | POA: Diagnosis not present

## 2020-05-26 ENCOUNTER — Other Ambulatory Visit: Payer: Self-pay | Admitting: Nurse Practitioner

## 2020-05-26 DIAGNOSIS — G894 Chronic pain syndrome: Secondary | ICD-10-CM | POA: Diagnosis not present

## 2020-05-26 DIAGNOSIS — M961 Postlaminectomy syndrome, not elsewhere classified: Secondary | ICD-10-CM | POA: Diagnosis not present

## 2020-05-26 DIAGNOSIS — Z9689 Presence of other specified functional implants: Secondary | ICD-10-CM | POA: Diagnosis not present

## 2020-05-31 DIAGNOSIS — E103512 Type 1 diabetes mellitus with proliferative diabetic retinopathy with macular edema, left eye: Secondary | ICD-10-CM | POA: Diagnosis not present

## 2020-05-31 DIAGNOSIS — H25013 Cortical age-related cataract, bilateral: Secondary | ICD-10-CM | POA: Diagnosis not present

## 2020-05-31 DIAGNOSIS — H524 Presbyopia: Secondary | ICD-10-CM | POA: Diagnosis not present

## 2020-06-15 DIAGNOSIS — E1165 Type 2 diabetes mellitus with hyperglycemia: Secondary | ICD-10-CM | POA: Diagnosis not present

## 2020-06-20 DIAGNOSIS — H43811 Vitreous degeneration, right eye: Secondary | ICD-10-CM | POA: Diagnosis not present

## 2020-06-20 DIAGNOSIS — H3582 Retinal ischemia: Secondary | ICD-10-CM | POA: Diagnosis not present

## 2020-06-20 DIAGNOSIS — E113513 Type 2 diabetes mellitus with proliferative diabetic retinopathy with macular edema, bilateral: Secondary | ICD-10-CM | POA: Diagnosis not present

## 2020-06-20 DIAGNOSIS — H25813 Combined forms of age-related cataract, bilateral: Secondary | ICD-10-CM | POA: Diagnosis not present

## 2020-06-25 DIAGNOSIS — E1165 Type 2 diabetes mellitus with hyperglycemia: Secondary | ICD-10-CM | POA: Diagnosis not present

## 2020-06-25 DIAGNOSIS — E119 Type 2 diabetes mellitus without complications: Secondary | ICD-10-CM | POA: Diagnosis not present

## 2020-06-29 DIAGNOSIS — I10 Essential (primary) hypertension: Secondary | ICD-10-CM | POA: Diagnosis not present

## 2020-06-29 DIAGNOSIS — G609 Hereditary and idiopathic neuropathy, unspecified: Secondary | ICD-10-CM | POA: Diagnosis not present

## 2020-06-29 DIAGNOSIS — F419 Anxiety disorder, unspecified: Secondary | ICD-10-CM | POA: Diagnosis not present

## 2020-06-29 DIAGNOSIS — E11319 Type 2 diabetes mellitus with unspecified diabetic retinopathy without macular edema: Secondary | ICD-10-CM | POA: Diagnosis not present

## 2020-06-29 DIAGNOSIS — Z9641 Presence of insulin pump (external) (internal): Secondary | ICD-10-CM | POA: Diagnosis not present

## 2020-06-29 DIAGNOSIS — E1165 Type 2 diabetes mellitus with hyperglycemia: Secondary | ICD-10-CM | POA: Diagnosis not present

## 2020-06-29 DIAGNOSIS — E7800 Pure hypercholesterolemia, unspecified: Secondary | ICD-10-CM | POA: Diagnosis not present

## 2020-06-29 DIAGNOSIS — Z139 Encounter for screening, unspecified: Secondary | ICD-10-CM | POA: Diagnosis not present

## 2020-06-30 DIAGNOSIS — E119 Type 2 diabetes mellitus without complications: Secondary | ICD-10-CM | POA: Diagnosis not present

## 2020-06-30 DIAGNOSIS — E1165 Type 2 diabetes mellitus with hyperglycemia: Secondary | ICD-10-CM | POA: Diagnosis not present

## 2020-07-15 ENCOUNTER — Ambulatory Visit: Payer: Medicare PPO | Admitting: Podiatry

## 2020-07-15 ENCOUNTER — Other Ambulatory Visit: Payer: Self-pay

## 2020-07-15 DIAGNOSIS — Z79899 Other long term (current) drug therapy: Secondary | ICD-10-CM

## 2020-07-15 DIAGNOSIS — M79676 Pain in unspecified toe(s): Secondary | ICD-10-CM | POA: Diagnosis not present

## 2020-07-15 DIAGNOSIS — B351 Tinea unguium: Secondary | ICD-10-CM | POA: Diagnosis not present

## 2020-07-15 DIAGNOSIS — L603 Nail dystrophy: Secondary | ICD-10-CM | POA: Diagnosis not present

## 2020-07-15 NOTE — Progress Notes (Signed)
  Subjective:  Patient ID: David Mcdowell, male    DOB: [birth year],  MRN: [id number]  Chief Complaint  Patient presents with   Nail Problem    Pt states he has nail fungus 35+ years duration and has tried Lamisil in the past but had to discontinue it due to cost.    65 y.o. male presents with the above complaint. History confirmed with patient.  Objective:  Physical Exam: warm, good capillary refill, no trophic changes or ulcerative lesions, normal DP and PT pulses, and normal sensory exam. Bilateral spoon shaped nails with thickening, dystrophy   Assessment:   1. Onychomycosis   2. Nail dystrophy   3. Pain around toenail   4. Encounter for long-term (current) use of high-risk medication   5. Pain due to onychomycosis of nail    Plan:  Patient was evaluated and treated and all questions answered.   Onychomycosis; does have spoon appearance that may be a systemic cause -Nails debrided x10 -Educated on etiology of nail fungus. -Nail sample taken for microbiology and histology. -Baseline liver function studies ordered. Will hold of starting terbinafine until back and culture back  Return in about 1 month (around 08/15/2020).

## 2020-07-16 LAB — HEPATIC FUNCTION PANEL
AG Ratio: 1.6 (calc) (ref 1.0–2.5)
ALT: 14 U/L (ref 9–46)
AST: 14 U/L (ref 10–35)
Albumin: 4.1 g/dL (ref 3.6–5.1)
Alkaline phosphatase (APISO): 55 U/L (ref 35–144)
Bilirubin, Direct: 0.1 mg/dL (ref 0.0–0.2)
Globulin: 2.5 g/dL (calc) (ref 1.9–3.7)
Indirect Bilirubin: 0.4 mg/dL (calc) (ref 0.2–1.2)
Total Bilirubin: 0.5 mg/dL (ref 0.2–1.2)
Total Protein: 6.6 g/dL (ref 6.1–8.1)

## 2020-07-25 DIAGNOSIS — E119 Type 2 diabetes mellitus without complications: Secondary | ICD-10-CM | POA: Diagnosis not present

## 2020-07-25 DIAGNOSIS — E1165 Type 2 diabetes mellitus with hyperglycemia: Secondary | ICD-10-CM | POA: Diagnosis not present

## 2020-08-01 ENCOUNTER — Telehealth: Payer: Self-pay | Admitting: Cardiology

## 2020-08-01 ENCOUNTER — Telehealth: Payer: Self-pay | Admitting: Nurse Practitioner

## 2020-08-01 ENCOUNTER — Other Ambulatory Visit: Payer: Self-pay

## 2020-08-01 MED ORDER — METOPROLOL SUCCINATE ER 50 MG PO TB24: 50.0000 mg | ORAL_TABLET | Freq: Every day | ORAL | 3 refills | Status: DC

## 2020-08-01 MED ORDER — OMEPRAZOLE 20 MG PO CPDR
20.0000 mg | DELAYED_RELEASE_CAPSULE | Freq: Every day | ORAL | 3 refills | Status: DC
Start: 2020-08-01 — End: 2021-05-30

## 2020-08-01 NOTE — Telephone Encounter (Signed)
*  STAT* If patient is at the pharmacy, call can be transferred to refill team.   1. Which medications need to be refilled? (please list name of each medication and dose if known) metoprolol succinate (TOPROL-XL) 50 MG 24 hr tablet   2. Which pharmacy/location (including street and city if local pharmacy) is medication to be sent to?[place]   3. Do they need a 30 day or 90 day supply? [place]

## 2020-08-01 NOTE — Telephone Encounter (Signed)
Patient is calling for a refill on Omeprazole however he is requesting for a 90 day supply if possible.

## 2020-08-01 NOTE — Telephone Encounter (Signed)
Called patient and let him know Refill of Omeprazole was sent to his pharmacy.

## 2020-08-18 DIAGNOSIS — Z1331 Encounter for screening for depression: Secondary | ICD-10-CM | POA: Diagnosis not present

## 2020-08-18 DIAGNOSIS — Z0001 Encounter for general adult medical examination with abnormal findings: Secondary | ICD-10-CM | POA: Diagnosis not present

## 2020-08-18 DIAGNOSIS — I10 Essential (primary) hypertension: Secondary | ICD-10-CM | POA: Diagnosis not present

## 2020-08-18 DIAGNOSIS — E7849 Other hyperlipidemia: Secondary | ICD-10-CM | POA: Diagnosis not present

## 2020-08-18 DIAGNOSIS — Z6837 Body mass index (BMI) 37.0-37.9, adult: Secondary | ICD-10-CM | POA: Diagnosis not present

## 2020-08-18 DIAGNOSIS — E1140 Type 2 diabetes mellitus with diabetic neuropathy, unspecified: Secondary | ICD-10-CM | POA: Diagnosis not present

## 2020-08-18 DIAGNOSIS — G894 Chronic pain syndrome: Secondary | ICD-10-CM | POA: Diagnosis not present

## 2020-08-18 DIAGNOSIS — G589 Mononeuropathy, unspecified: Secondary | ICD-10-CM | POA: Diagnosis not present

## 2020-08-19 ENCOUNTER — Ambulatory Visit: Payer: Medicare PPO | Admitting: Podiatry

## 2020-08-19 ENCOUNTER — Other Ambulatory Visit: Payer: Self-pay

## 2020-08-19 DIAGNOSIS — G609 Hereditary and idiopathic neuropathy, unspecified: Secondary | ICD-10-CM | POA: Insufficient documentation

## 2020-08-19 DIAGNOSIS — E1165 Type 2 diabetes mellitus with hyperglycemia: Secondary | ICD-10-CM | POA: Insufficient documentation

## 2020-08-19 DIAGNOSIS — L603 Nail dystrophy: Secondary | ICD-10-CM | POA: Diagnosis not present

## 2020-08-19 DIAGNOSIS — M79676 Pain in unspecified toe(s): Secondary | ICD-10-CM

## 2020-08-19 DIAGNOSIS — Z9641 Presence of insulin pump (external) (internal): Secondary | ICD-10-CM | POA: Insufficient documentation

## 2020-08-19 DIAGNOSIS — B351 Tinea unguium: Secondary | ICD-10-CM

## 2020-08-19 DIAGNOSIS — E11319 Type 2 diabetes mellitus with unspecified diabetic retinopathy without macular edema: Secondary | ICD-10-CM | POA: Insufficient documentation

## 2020-08-19 DIAGNOSIS — F419 Anxiety disorder, unspecified: Secondary | ICD-10-CM | POA: Insufficient documentation

## 2020-08-19 MED ORDER — TERBINAFINE HCL 250 MG PO TABS: 250.0000 mg | ORAL_TABLET | Freq: Every day | ORAL | 2 refills | Status: DC

## 2020-08-19 NOTE — Progress Notes (Signed)
  Subjective:  Patient ID: David Mcdowell, male    DOB: [birth year],  MRN: [id number]  Chief Complaint  Patient presents with   Nail Problem    F/U naili fungus and review nail fungus results with pt -pt denies any improvement -Tx: none   65 y.o. male presents with the above complaint. History confirmed with patient.  Objective:  Physical Exam: warm, good capillary refill, no trophic changes or ulcerative lesions, normal DP and PT pulses, and normal sensory exam. Bilateral spoon shaped nails with thickening, dystrophy   Assessment:   1. Onychomycosis   2. Nail dystrophy   3. Pain around toenail    Plan:  Patient was evaluated and treated and all questions answered.   Onychomycosis; does have spoon appearance that may be a systemic cause -LFTs reviewed - normal -Reviewed nail histology and culture - should be susceptible to lamisil. -F/u in 1 month for recheck labs  Return in about 4 weeks (around 09/16/2020) for Nail Fungus.

## 2020-08-25 DIAGNOSIS — E119 Type 2 diabetes mellitus without complications: Secondary | ICD-10-CM | POA: Diagnosis not present

## 2020-08-25 DIAGNOSIS — E1165 Type 2 diabetes mellitus with hyperglycemia: Secondary | ICD-10-CM | POA: Diagnosis not present

## 2020-08-31 DIAGNOSIS — M5416 Radiculopathy, lumbar region: Secondary | ICD-10-CM | POA: Diagnosis not present

## 2020-08-31 DIAGNOSIS — Z6837 Body mass index (BMI) 37.0-37.9, adult: Secondary | ICD-10-CM | POA: Diagnosis not present

## 2020-08-31 DIAGNOSIS — I10 Essential (primary) hypertension: Secondary | ICD-10-CM | POA: Diagnosis not present

## 2020-08-31 DIAGNOSIS — M961 Postlaminectomy syndrome, not elsewhere classified: Secondary | ICD-10-CM | POA: Diagnosis not present

## 2020-09-16 ENCOUNTER — Ambulatory Visit: Payer: Medicare PPO | Admitting: Podiatry

## 2020-09-22 DIAGNOSIS — E7800 Pure hypercholesterolemia, unspecified: Secondary | ICD-10-CM | POA: Diagnosis not present

## 2020-09-22 DIAGNOSIS — G609 Hereditary and idiopathic neuropathy, unspecified: Secondary | ICD-10-CM | POA: Diagnosis not present

## 2020-09-22 DIAGNOSIS — Z139 Encounter for screening, unspecified: Secondary | ICD-10-CM | POA: Diagnosis not present

## 2020-09-22 DIAGNOSIS — Z125 Encounter for screening for malignant neoplasm of prostate: Secondary | ICD-10-CM | POA: Diagnosis not present

## 2020-09-22 DIAGNOSIS — E11319 Type 2 diabetes mellitus with unspecified diabetic retinopathy without macular edema: Secondary | ICD-10-CM | POA: Diagnosis not present

## 2020-09-22 DIAGNOSIS — I10 Essential (primary) hypertension: Secondary | ICD-10-CM | POA: Diagnosis not present

## 2020-09-22 DIAGNOSIS — F419 Anxiety disorder, unspecified: Secondary | ICD-10-CM | POA: Diagnosis not present

## 2020-09-22 DIAGNOSIS — Z9641 Presence of insulin pump (external) (internal): Secondary | ICD-10-CM | POA: Diagnosis not present

## 2020-09-22 DIAGNOSIS — E1165 Type 2 diabetes mellitus with hyperglycemia: Secondary | ICD-10-CM | POA: Diagnosis not present

## 2020-09-23 ENCOUNTER — Ambulatory Visit: Payer: Medicare PPO | Admitting: Podiatry

## 2020-09-23 ENCOUNTER — Other Ambulatory Visit: Payer: Self-pay

## 2020-09-23 DIAGNOSIS — L603 Nail dystrophy: Secondary | ICD-10-CM | POA: Diagnosis not present

## 2020-09-23 DIAGNOSIS — B351 Tinea unguium: Secondary | ICD-10-CM

## 2020-09-23 NOTE — Progress Notes (Signed)
  Subjective:  Patient ID: David Mcdowell, male    DOB: [birth year],  MRN: [id number]  Chief Complaint  Patient presents with   Nail Problem    Nails thick and painful, requests trim.    65 y.o. male presents with the above complaint. History confirmed with patient. Thinks the nails are looking a little different. Had his blood work done a few days ago and has them for review.  Objective:  Physical Exam: warm, good capillary refill, no trophic changes or ulcerative lesions, normal DP and PT pulses, and normal sensory exam. Bilateral spoon shaped nails with thickening, dystrophy but some early proximal clearing.   Assessment:   1. Onychomycosis   2. Nail dystrophy    Plan:  Patient was evaluated and treated and all questions answered.   Onychomycosis; does have spoon appearance that may be a systemic cause -Patient provided repeat LFTs today - no elevations of liver function. Ok to continue lamisil -Nails debrided today - some clear growth noted. -F/u in 2 months for recheck  No follow-ups on file.

## 2020-09-25 DIAGNOSIS — E119 Type 2 diabetes mellitus without complications: Secondary | ICD-10-CM | POA: Diagnosis not present

## 2020-09-25 DIAGNOSIS — E1165 Type 2 diabetes mellitus with hyperglycemia: Secondary | ICD-10-CM | POA: Diagnosis not present

## 2020-09-29 DIAGNOSIS — G609 Hereditary and idiopathic neuropathy, unspecified: Secondary | ICD-10-CM | POA: Diagnosis not present

## 2020-09-29 DIAGNOSIS — I10 Essential (primary) hypertension: Secondary | ICD-10-CM | POA: Diagnosis not present

## 2020-09-29 DIAGNOSIS — E11319 Type 2 diabetes mellitus with unspecified diabetic retinopathy without macular edema: Secondary | ICD-10-CM | POA: Diagnosis not present

## 2020-09-29 DIAGNOSIS — Z9641 Presence of insulin pump (external) (internal): Secondary | ICD-10-CM | POA: Diagnosis not present

## 2020-09-29 DIAGNOSIS — Z23 Encounter for immunization: Secondary | ICD-10-CM | POA: Diagnosis not present

## 2020-09-29 DIAGNOSIS — E7800 Pure hypercholesterolemia, unspecified: Secondary | ICD-10-CM | POA: Diagnosis not present

## 2020-09-29 DIAGNOSIS — E1165 Type 2 diabetes mellitus with hyperglycemia: Secondary | ICD-10-CM | POA: Diagnosis not present

## 2020-09-29 DIAGNOSIS — F419 Anxiety disorder, unspecified: Secondary | ICD-10-CM | POA: Diagnosis not present

## 2020-10-04 ENCOUNTER — Other Ambulatory Visit: Payer: Self-pay | Admitting: Podiatry

## 2020-10-04 NOTE — Telephone Encounter (Signed)
Please advise 

## 2020-10-25 DIAGNOSIS — E1165 Type 2 diabetes mellitus with hyperglycemia: Secondary | ICD-10-CM | POA: Diagnosis not present

## 2020-10-25 DIAGNOSIS — E119 Type 2 diabetes mellitus without complications: Secondary | ICD-10-CM | POA: Diagnosis not present

## 2020-11-04 ENCOUNTER — Ambulatory Visit (INDEPENDENT_AMBULATORY_CARE_PROVIDER_SITE_OTHER): Payer: Medicare PPO

## 2020-11-04 ENCOUNTER — Other Ambulatory Visit: Payer: Self-pay

## 2020-11-04 ENCOUNTER — Ambulatory Visit: Payer: Medicare PPO | Admitting: Podiatry

## 2020-11-04 DIAGNOSIS — D2122 Benign neoplasm of connective and other soft tissue of left lower limb, including hip: Secondary | ICD-10-CM | POA: Diagnosis not present

## 2020-11-04 DIAGNOSIS — M722 Plantar fascial fibromatosis: Secondary | ICD-10-CM | POA: Diagnosis not present

## 2020-11-04 MED ORDER — BETAMETHASONE SOD PHOS & ACET 6 (3-3) MG/ML IJ SUSP: 3.0000 mg | Freq: Once | INTRAMUSCULAR | Status: DC

## 2020-11-04 NOTE — Progress Notes (Signed)
  Subjective:  Patient ID: David Mcdowell, male    DOB: 1955-07-23,  MRN: [id number]  Chief Complaint  Patient presents with   Plantar Fasciitis    Knot noted to the bottom of his left foot. 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 male presents with the above complaint. History confirmed with patient.   Objective:  Physical Exam: warm, good capillary refill, no trophic changes or ulcerative lesions, normal DP and PT pulses, and normal sensory exam. Left Foot: firm painful nodule medial arch without warmth, erythema. Immobile. NO pain proximal medial calc tuber.    No images are attached to the encounter.  Radiographs: X-ray of the left foot: no fracture, dislocation, swelling or degenerative changes noted Assessment:   1. Plantar fasciitis   2. Fibroma of lower extremity, left     Plan:  Patient was evaluated and treated and all questions answered.  Left medial plantar fascia -XR reviewed with patient -Educated patient on stretching and icing of the affected limb -Injection delivered to the fibroma of the plantar fascia of the left foot.  Procedure: Fibroma injection Location: left medial plantar fascia  Skin Prep: Alcohol. Injectate: 0.5 cc 0.5 cc marcaine plain, 0.5 betamethasone acetate-betamethasone sodium phosphate Disposition: Patient tolerated procedure well. Injection site dressed with a band-aid.   No follow-ups on file.

## 2020-11-21 DIAGNOSIS — M5416 Radiculopathy, lumbar region: Secondary | ICD-10-CM | POA: Diagnosis not present

## 2020-11-21 DIAGNOSIS — Z9689 Presence of other specified functional implants: Secondary | ICD-10-CM | POA: Diagnosis not present

## 2020-11-21 DIAGNOSIS — M961 Postlaminectomy syndrome, not elsewhere classified: Secondary | ICD-10-CM | POA: Diagnosis not present

## 2020-11-23 DIAGNOSIS — H35372 Puckering of macula, left eye: Secondary | ICD-10-CM | POA: Diagnosis not present

## 2020-11-23 DIAGNOSIS — E113513 Type 2 diabetes mellitus with proliferative diabetic retinopathy with macular edema, bilateral: Secondary | ICD-10-CM | POA: Diagnosis not present

## 2020-11-23 DIAGNOSIS — H43811 Vitreous degeneration, right eye: Secondary | ICD-10-CM | POA: Diagnosis not present

## 2020-11-23 DIAGNOSIS — H3582 Retinal ischemia: Secondary | ICD-10-CM | POA: Diagnosis not present

## 2020-11-28 DIAGNOSIS — E119 Type 2 diabetes mellitus without complications: Secondary | ICD-10-CM | POA: Diagnosis not present

## 2020-11-28 DIAGNOSIS — E1165 Type 2 diabetes mellitus with hyperglycemia: Secondary | ICD-10-CM | POA: Diagnosis not present

## 2020-12-05 ENCOUNTER — Other Ambulatory Visit: Payer: Self-pay | Admitting: *Deleted

## 2020-12-05 MED ORDER — PRAVASTATIN SODIUM 80 MG PO TABS: 80.0000 mg | ORAL_TABLET | Freq: Every day | ORAL | 3 refills | Status: DC

## 2020-12-06 ENCOUNTER — Other Ambulatory Visit: Payer: Self-pay

## 2020-12-06 ENCOUNTER — Ambulatory Visit: Payer: Medicare PPO | Admitting: Podiatry

## 2020-12-06 DIAGNOSIS — D2122 Benign neoplasm of connective and other soft tissue of left lower limb, including hip: Secondary | ICD-10-CM | POA: Diagnosis not present

## 2020-12-06 DIAGNOSIS — B351 Tinea unguium: Secondary | ICD-10-CM | POA: Diagnosis not present

## 2020-12-06 DIAGNOSIS — L603 Nail dystrophy: Secondary | ICD-10-CM | POA: Diagnosis not present

## 2020-12-06 MED ORDER — BETAMETHASONE SOD PHOS & ACET 6 (3-3) MG/ML IJ SUSP
6.0000 mg | Freq: Once | INTRAMUSCULAR | Status: AC
  Administered 2020-12-06: 6 mg

## 2020-12-06 NOTE — Progress Notes (Addendum)
  Subjective:  Patient ID: David Mcdowell, male    DOB: March 26, 1955,  MRN: [id number]  Chief Complaint  Patient presents with   Plantar Fasciitis    Patient states the pain has improved a great deal since having the injection at last visit.    Nail Problem    Requesting a toenail trim. Patient unable to trim own nails due to back issues. Nails are long, thick, painful and difficult to trim   65 y.o. male presents with the above complaint. History confirmed with patient.   Objective:  Physical Exam: warm, good capillary refill, no trophic changes or ulcerative lesions, normal DP and PT pulses, and normal sensory exam. Nails thickened dystrophic slight spoon appearance Left Foot: firm painful nodule medial arch without warmth, erythema. Immobile. NO pain proximal medial calc tuber.   Assessment:   1. Fibroma of lower extremity, left    Plan:  Patient was evaluated and treated and all questions answered.  Left medial plantar fascia -Repeat injection to the left medial plantar fibroma  Procedure: Fibroma injection Location: left medial plantar fascia  Skin Prep: Alcohol. Injectate: 0.5 cc 0.5 cc marcaine plain, 0.5 betamethasone acetate-betamethasone sodium phosphate Disposition: Patient tolerated procedure well. Injection site dressed with a band-aid.  Onychomycosis -Nails debrided. Continue current regimen  Return if symptoms worsen or fail to improve.

## 2020-12-06 NOTE — Addendum Note (Signed)
Addended by: Hardie Pulley on: 12/06/2020 01:33 PM   Modules accepted: Level of Service

## 2020-12-26 DIAGNOSIS — E1165 Type 2 diabetes mellitus with hyperglycemia: Secondary | ICD-10-CM | POA: Diagnosis not present

## 2020-12-26 DIAGNOSIS — Z9641 Presence of insulin pump (external) (internal): Secondary | ICD-10-CM | POA: Diagnosis not present

## 2020-12-26 DIAGNOSIS — E7800 Pure hypercholesterolemia, unspecified: Secondary | ICD-10-CM | POA: Diagnosis not present

## 2020-12-26 DIAGNOSIS — F419 Anxiety disorder, unspecified: Secondary | ICD-10-CM | POA: Diagnosis not present

## 2020-12-26 DIAGNOSIS — I10 Essential (primary) hypertension: Secondary | ICD-10-CM | POA: Diagnosis not present

## 2020-12-26 DIAGNOSIS — E11319 Type 2 diabetes mellitus with unspecified diabetic retinopathy without macular edema: Secondary | ICD-10-CM | POA: Diagnosis not present

## 2020-12-26 DIAGNOSIS — G609 Hereditary and idiopathic neuropathy, unspecified: Secondary | ICD-10-CM | POA: Diagnosis not present

## 2021-01-12 DIAGNOSIS — E1165 Type 2 diabetes mellitus with hyperglycemia: Secondary | ICD-10-CM | POA: Diagnosis not present

## 2021-01-17 ENCOUNTER — Other Ambulatory Visit: Payer: Self-pay | Admitting: Podiatry

## 2021-01-17 DIAGNOSIS — B351 Tinea unguium: Secondary | ICD-10-CM

## 2021-02-21 DIAGNOSIS — M961 Postlaminectomy syndrome, not elsewhere classified: Secondary | ICD-10-CM | POA: Diagnosis not present

## 2021-02-21 DIAGNOSIS — Z9689 Presence of other specified functional implants: Secondary | ICD-10-CM | POA: Diagnosis not present

## 2021-02-21 DIAGNOSIS — Z6833 Body mass index (BMI) 33.0-33.9, adult: Secondary | ICD-10-CM | POA: Diagnosis not present

## 2021-02-21 DIAGNOSIS — M5416 Radiculopathy, lumbar region: Secondary | ICD-10-CM | POA: Diagnosis not present

## 2021-03-08 ENCOUNTER — Other Ambulatory Visit: Payer: Self-pay

## 2021-03-08 ENCOUNTER — Ambulatory Visit: Payer: Medicare PPO | Admitting: Podiatry

## 2021-03-08 DIAGNOSIS — M722 Plantar fascial fibromatosis: Secondary | ICD-10-CM

## 2021-03-09 DIAGNOSIS — E113513 Type 2 diabetes mellitus with proliferative diabetic retinopathy with macular edema, bilateral: Secondary | ICD-10-CM | POA: Diagnosis not present

## 2021-03-09 DIAGNOSIS — H35372 Puckering of macula, left eye: Secondary | ICD-10-CM | POA: Diagnosis not present

## 2021-03-09 DIAGNOSIS — H3582 Retinal ischemia: Secondary | ICD-10-CM | POA: Diagnosis not present

## 2021-03-09 DIAGNOSIS — H43811 Vitreous degeneration, right eye: Secondary | ICD-10-CM | POA: Diagnosis not present

## 2021-03-10 DIAGNOSIS — L821 Other seborrheic keratosis: Secondary | ICD-10-CM | POA: Diagnosis not present

## 2021-03-10 DIAGNOSIS — D229 Melanocytic nevi, unspecified: Secondary | ICD-10-CM | POA: Diagnosis not present

## 2021-03-10 DIAGNOSIS — L603 Nail dystrophy: Secondary | ICD-10-CM | POA: Diagnosis not present

## 2021-03-10 DIAGNOSIS — L814 Other melanin hyperpigmentation: Secondary | ICD-10-CM | POA: Diagnosis not present

## 2021-03-14 NOTE — Progress Notes (Signed)
?Subjective:  ?Patient ID: David Mcdowell, male    DOB: September 17, 1955,  MRN: [id number] ? ?Chief Complaint  ?Patient presents with  ? Plantar Fasciitis  ?  Patient states for about 2 weeks the pain in his heels have been unbearable. The left foot hurts more than the right does at this time.   ? Nail Problem  ?  Patient reports elongated. Thick, brittle and painful toenails that are difficult to trim.   ? ? ?66 y.o. male presents with the above complaint.  Patient presents with primary complaint of left plantar fibroma.  Patient states the left side is much worse because of the more pain that he is experiencing.  Is been almost unbearable pain for 2 weeks.  He states that he is known to Dr. Prudence Davidson for routine foot care.  He is here to discuss surgical options for the plantar foot.  He has not seen and was prior to seeing me.  He is not able to undergo the surgery right now would like to discuss other treatment options if any.  He has tried some offloading and shoe gear modification none of which has helped. ? ? ?Review of Systems: Negative except as noted in the HPI. Denies N/V/F/Ch. ? ?Past Medical History:  ?Diagnosis Date  ? Abscess   ? Anxiety   ? Arthritis   ? Chronic back pain   ? spinal implant  ? Coronary atherosclerosis   ? Mild at cardiac catheterization December 2020  ? Essential hypertension   ? GERD (gastroesophageal reflux disease)   ? History of kidney stones   ? Mixed hyperlipidemia   ? Myositis   ? Neuropathy   ? Recurrent kidney stones   ? Type 1 diabetes mellitus ([place])   ? Insulin pump  ? ? ?Current Outpatient Medications:  ?  aspirin EC 81 MG tablet, Take 81 mg by mouth daily., Disp: , Rfl:  ?  Carboxymethylcellul-Glycerin (LUBRICATING EYE DROPS OP), Place 1 drop into both eyes daily as needed (dry eyes)., Disp: , Rfl:  ?  clobetasol cream (TEMOVATE) 7.34 %, 1 application, Disp: , Rfl:  ?  colestipol (COLESTID) 1 g tablet, TAKE ONE TABLET BY MOUTH TWICE DAILY., Disp: 180 tablet, Rfl: 3 ?  Continuous  Blood Gluc Receiver ([place]) DEVI, See admin instructions., Disp: , Rfl:  ?  gabapentin (NEURONTIN) 300 MG capsule, Take 300-600 mg by mouth See admin instructions. Take 300 mg in the morning and 600 mg at night, Disp: , Rfl:  ?  HUMULIN R 500 UNIT/ML injection, by Continuous infusion (non-IV) route continuous. , Disp: , Rfl:  ?  Insulin Human (INSULIN PUMP) SOLN, Inject into the skin daily. Insulin Concentrated (Humulin R-500) unit/ml, Disp: , Rfl:  ?  JARDIANCE 25 MG TABS tablet, Take 25 mg by mouth daily., Disp: , Rfl:  ?  lidocaine (XYLOCAINE) 5 % ointment, APPLY TO THE AFFECTED AREA 1-4 TIMES DAILY AS NEEDED, Disp: , Rfl:  ?  Lidocaine 5 % CREA, Apply 1 application topically daily as needed (muscle pain)., Disp: , Rfl:  ?  lisinopril (ZESTRIL) 40 MG tablet, TAKE ONE TABLET BY MOUTH ONCE DAILY, Disp: 90 tablet, Rfl: 3 ?  lisinopril (ZESTRIL) 40 MG tablet, 1 tablet, Disp: , Rfl:  ?  metFORMIN (GLUCOPHAGE) 1000 MG tablet, Take 1 tablet (1,000 mg total) by mouth 2 (two) times daily., Disp: , Rfl: 3 ?  metFORMIN (GLUCOPHAGE) 1000 MG tablet, Take 1 tablet by mouth 2 (two) times daily., Disp: , Rfl:  ?  metoprolol succinate (TOPROL XL) 50 MG 24 hr tablet, Take 1 tablet (50 mg total) by mouth daily. Take with or immediately following a meal., Disp: 90 tablet, Rfl: 3 ?  montelukast (SINGULAIR) 10 MG tablet, Take 10 mg by mouth daily as needed (allergies). , Disp: , Rfl: 2 ?  omeprazole (PRILOSEC) 20 MG capsule, Take 1 capsule (20 mg total) by mouth daily., Disp: 90 capsule, Rfl: 3 ?  ondansetron (ZOFRAN) 4 MG tablet, TAKE ONE TABLET BY MOUTH EVERY 6 HOURS AS NEEDED FOR NAUSEA AND VOMITING, Disp: 30 tablet, Rfl: 1 ?  ondansetron (ZOFRAN) 4 MG tablet, Take by mouth., Disp: , Rfl:  ?  Oxycodone HCl 10 MG TABS, take 1 tablet by oral route  every 6-8 hours for severe pain, Disp: , Rfl:  ?  pravastatin (PRAVACHOL) 80 MG tablet, Take 1 tablet (80 mg total) by mouth daily., Disp: 90 tablet, Rfl: 3 ?  terbinafine  (LAMISIL) 250 MG tablet, TAKE ONE TABLET BY MOUTH EVERY DAY, Disp: 30 tablet, Rfl: 2 ?  traZODone (DESYREL) 150 MG tablet, Take by mouth at bedtime., Disp: , Rfl:  ? ?Social History  ? ?Tobacco Use  ?Smoking Status Never  ?Smokeless Tobacco Never  ? ? ?No Known Allergies ?Objective:  ?There were no vitals filed for this visit. ?There is no height or weight on file to calculate BMI. ?Constitutional Well developed. ?Well nourished.  ?Vascular Dorsalis pedis pulses palpable bilaterally. ?Posterior tibial pulses palpable bilaterally. ?Capillary refill normal to all digits.  ?No cyanosis or clubbing noted. ?Pedal hair growth normal.  ?Neurologic Normal speech. ?Oriented to person, place, and time. ?Epicritic sensation to light touch grossly present bilaterally.  ?Dermatologic Left plantar fibroma but noted single lobulated clinically able to appreciate induration of the fibroma.  Negative transilluminates.  ?Orthopedic: Normal joint ROM without pain or crepitus bilaterally. ?No visible deformities. ?No bony tenderness.  ? ?Radiographs: None ?Assessment:  ? ?1. Plantar fibromatosis   ? ?Plan:  ?Patient was evaluated and treated and all questions answered. ? ?Left plantar fibroma/fibromatosis ?-All questions and concerns were discussed with the patient in extensive detail.  Patient will benefit from surgical excision as he has failed all conservative treatment options.  For now patient would like to have the surgery done in the future would like to think about the procedure will come back and see me when he is ready to undergo surgery.  I discussed shoe gear modification once again.  He states understanding. ? ?No follow-ups on file. ?

## 2021-03-24 ENCOUNTER — Ambulatory Visit: Payer: Medicare PPO | Admitting: Podiatry

## 2021-03-24 ENCOUNTER — Other Ambulatory Visit: Payer: Self-pay

## 2021-03-24 DIAGNOSIS — Z01818 Encounter for other preprocedural examination: Secondary | ICD-10-CM | POA: Diagnosis not present

## 2021-03-24 DIAGNOSIS — M722 Plantar fascial fibromatosis: Secondary | ICD-10-CM

## 2021-03-29 ENCOUNTER — Encounter: Payer: Self-pay | Admitting: Podiatry

## 2021-03-29 DIAGNOSIS — E1165 Type 2 diabetes mellitus with hyperglycemia: Secondary | ICD-10-CM | POA: Diagnosis not present

## 2021-03-29 DIAGNOSIS — G609 Hereditary and idiopathic neuropathy, unspecified: Secondary | ICD-10-CM | POA: Diagnosis not present

## 2021-03-29 DIAGNOSIS — E11319 Type 2 diabetes mellitus with unspecified diabetic retinopathy without macular edema: Secondary | ICD-10-CM | POA: Diagnosis not present

## 2021-03-29 DIAGNOSIS — F419 Anxiety disorder, unspecified: Secondary | ICD-10-CM | POA: Diagnosis not present

## 2021-03-29 DIAGNOSIS — E7800 Pure hypercholesterolemia, unspecified: Secondary | ICD-10-CM | POA: Diagnosis not present

## 2021-03-29 DIAGNOSIS — I10 Essential (primary) hypertension: Secondary | ICD-10-CM | POA: Diagnosis not present

## 2021-03-29 DIAGNOSIS — Z9641 Presence of insulin pump (external) (internal): Secondary | ICD-10-CM | POA: Diagnosis not present

## 2021-03-29 NOTE — Progress Notes (Signed)
?Subjective:  ?Patient ID: David Mcdowell, male    DOB: [birth year],  MRN: [id number] ? ?Chief Complaint  ?Patient presents with  ? surgical consult   ?  Pt is here to schedule surgery   ? ? ?66 y.o. male presents with the above complaint.  Patient presents with primary complaint of left plantar fibroma.  Patient states it a lot more painful now.  He would like to discuss surgical options.  He he has tried shoe gear modification offloading none of which has helped. ? ? ?Review of Systems: Negative except as noted in the HPI. Denies N/V/F/Ch. ? ?Past Medical History:  ?Diagnosis Date  ? Abscess   ? Anxiety   ? Arthritis   ? Chronic back pain   ? spinal implant  ? Coronary atherosclerosis   ? Mild at cardiac catheterization December 2020  ? Essential hypertension   ? GERD (gastroesophageal reflux disease)   ? History of kidney stones   ? Mixed hyperlipidemia   ? Myositis   ? Neuropathy   ? Recurrent kidney stones   ? Type 1 diabetes mellitus ([place])   ? Insulin pump  ? ? ?Current Outpatient Medications:  ?  aspirin EC 81 MG tablet, Take 81 mg by mouth daily., Disp: , Rfl:  ?  Carboxymethylcellul-Glycerin (LUBRICATING EYE DROPS OP), Place 1 drop into both eyes daily as needed (dry eyes)., Disp: , Rfl:  ?  clobetasol cream (TEMOVATE) 3.01 %, 1 application, Disp: , Rfl:  ?  colestipol (COLESTID) 1 g tablet, TAKE ONE TABLET BY MOUTH TWICE DAILY., Disp: 180 tablet, Rfl: 3 ?  Continuous Blood Gluc Receiver ([place]) DEVI, See admin instructions., Disp: , Rfl:  ?  gabapentin (NEURONTIN) 300 MG capsule, Take 300-600 mg by mouth See admin instructions. Take 300 mg in the morning and 600 mg at night, Disp: , Rfl:  ?  HUMULIN R 500 UNIT/ML injection, by Continuous infusion (non-IV) route continuous. , Disp: , Rfl:  ?  Insulin Human (INSULIN PUMP) SOLN, Inject into the skin daily. Insulin Concentrated (Humulin R-500) unit/ml, Disp: , Rfl:  ?  JARDIANCE 25 MG TABS tablet, Take 25 mg by mouth daily., Disp: , Rfl:  ?   lidocaine (XYLOCAINE) 5 % ointment, APPLY TO THE AFFECTED AREA 1-4 TIMES DAILY AS NEEDED, Disp: , Rfl:  ?  Lidocaine 5 % CREA, Apply 1 application topically daily as needed (muscle pain)., Disp: , Rfl:  ?  lisinopril (ZESTRIL) 40 MG tablet, TAKE ONE TABLET BY MOUTH ONCE DAILY, Disp: 90 tablet, Rfl: 3 ?  lisinopril (ZESTRIL) 40 MG tablet, 1 tablet, Disp: , Rfl:  ?  metFORMIN (GLUCOPHAGE) 1000 MG tablet, Take 1 tablet (1,000 mg total) by mouth 2 (two) times daily., Disp: , Rfl: 3 ?  metFORMIN (GLUCOPHAGE) 1000 MG tablet, Take 1 tablet by mouth 2 (two) times daily., Disp: , Rfl:  ?  metoprolol succinate (TOPROL XL) 50 MG 24 hr tablet, Take 1 tablet (50 mg total) by mouth daily. Take with or immediately following a meal., Disp: 90 tablet, Rfl: 3 ?  montelukast (SINGULAIR) 10 MG tablet, Take 10 mg by mouth daily as needed (allergies). , Disp: , Rfl: 2 ?  omeprazole (PRILOSEC) 20 MG capsule, Take 1 capsule (20 mg total) by mouth daily., Disp: 90 capsule, Rfl: 3 ?  ondansetron (ZOFRAN) 4 MG tablet, TAKE ONE TABLET BY MOUTH EVERY 6 HOURS AS NEEDED FOR NAUSEA AND VOMITING, Disp: 30 tablet, Rfl: 1 ?  ondansetron (ZOFRAN) 4 MG tablet,  Take by mouth., Disp: , Rfl:  ?  Oxycodone HCl 10 MG TABS, take 1 tablet by oral route  every 6-8 hours for severe pain, Disp: , Rfl:  ?  pravastatin (PRAVACHOL) 80 MG tablet, Take 1 tablet (80 mg total) by mouth daily., Disp: 90 tablet, Rfl: 3 ?  terbinafine (LAMISIL) 250 MG tablet, TAKE ONE TABLET BY MOUTH EVERY DAY, Disp: 30 tablet, Rfl: 2 ?  traZODone (DESYREL) 150 MG tablet, Take by mouth at bedtime., Disp: , Rfl:  ? ?Social History  ? ?Tobacco Use  ?Smoking Status Never  ?Smokeless Tobacco Never  ? ? ?No Known Allergies ?Objective:  ?There were no vitals filed for this visit. ?There is no height or weight on file to calculate BMI. ?Constitutional Well developed. ?Well nourished.  ?Vascular Dorsalis pedis pulses palpable bilaterally. ?Posterior tibial pulses palpable bilaterally. ?Capillary  refill normal to all digits.  ?No cyanosis or clubbing noted. ?Pedal hair growth normal.  ?Neurologic Normal speech. ?Oriented to person, place, and time. ?Epicritic sensation to light touch grossly present bilaterally.  ?Dermatologic Left plantar fibroma but noted single lobulated clinically able to appreciate induration of the fibroma.  Negative transilluminates.  ?Orthopedic: Normal joint ROM without pain or crepitus bilaterally. ?No visible deformities. ?No bony tenderness.  ? ?Radiographs: None ?Assessment:  ? ?1. Plantar fibromatosis   ?2. Encounter for preoperative examination for general surgical procedure   ? ? ?Plan:  ?Patient was evaluated and treated and all questions answered. ? ?Left plantar fibroma/fibromatosis ?-All questions and concerns were discussed with the patient in extensive detail.  Patient will benefit from surgical excision as he has failed all conservative treatment options.   ?-Clinically his soft tissue mass has continued to get worse at this time patient will benefit from surgical excision of the plantar fibroma.  I discussed my surgical planning as well as plantar incision in extensive detail.  Given that he is a diabetic with last A1c of 6.6 per patient I discussed with the patient that he is still at high risk for wound complication.  However patient would like to proceed with the surgery despite all the risks.   ?-He will be nonweightbearing to the left lower extremity after the excision given that this will be a plantar incision. ?-Informed surgical risk consent was reviewed and read aloud to the patient.  I reviewed the films.  I have discussed my findings with the patient in great detail.  I have discussed all risks including but not limited to infection, stiffness, scarring, limp, disability, deformity, damage to blood vessels and nerves, numbness, poor healing, need for braces, arthritis, chronic pain, amputation, death.  All benefits and realistic expectations discussed in  great detail.  I have made no promises as to the outcome.  I have provided realistic expectations.  I have offered the patient a 2nd opinion, which they have declined and assured me they preferred to proceed despite the risks ? ? ?No follow-ups on file. ?

## 2021-04-06 ENCOUNTER — Other Ambulatory Visit: Payer: Self-pay | Admitting: Cardiology

## 2021-04-10 ENCOUNTER — Ambulatory Visit: Payer: Medicare PPO | Admitting: Podiatry

## 2021-04-11 DIAGNOSIS — E1165 Type 2 diabetes mellitus with hyperglycemia: Secondary | ICD-10-CM | POA: Diagnosis not present

## 2021-04-18 ENCOUNTER — Encounter: Payer: Self-pay | Admitting: Podiatry

## 2021-04-18 ENCOUNTER — Ambulatory Visit (INDEPENDENT_AMBULATORY_CARE_PROVIDER_SITE_OTHER): Payer: Medicare PPO | Admitting: Podiatry

## 2021-04-18 DIAGNOSIS — E1142 Type 2 diabetes mellitus with diabetic polyneuropathy: Secondary | ICD-10-CM

## 2021-04-18 DIAGNOSIS — L603 Nail dystrophy: Secondary | ICD-10-CM

## 2021-04-18 DIAGNOSIS — E119 Type 2 diabetes mellitus without complications: Secondary | ICD-10-CM

## 2021-04-18 DIAGNOSIS — M722 Plantar fascial fibromatosis: Secondary | ICD-10-CM

## 2021-04-18 DIAGNOSIS — B351 Tinea unguium: Secondary | ICD-10-CM

## 2021-04-18 DIAGNOSIS — M79676 Pain in unspecified toe(s): Secondary | ICD-10-CM | POA: Diagnosis not present

## 2021-04-23 NOTE — Progress Notes (Signed)
ANNUAL DIABETIC FOOT EXAM ? ?Subjective: ?David Mcdowell presents today for annual diabetic foot examination. ? ?Patient relates 66 year h/o diabetes. ? ?Patient denies any h/o foot wounds. ? ?Patient has c/o thick toenails which are tender with pressure from shoe gear. Patient states nails get caught on  and tore their bedsheets. He states he has gone through three dremel tools. His wife has been filing the toenails over the years and feels he should see a professional now. ? ?Patient is also seeing Dr. Posey Pronto for painful plantar fibromatotis of left foot. ? ?Patient's blood sugar was 102 mg/dl today. Last HgA1c was 6.2 ? ?Risk factors: diabetes, neuropathy, HTN, hyperlipidemia. ? ?Jake Samples, PA-C is patient's PCP. Last visit was 2022. ? ?Past Medical History:  ?Diagnosis Date  ? Abscess   ? Anxiety   ? Arthritis   ? Chronic back pain   ? spinal implant  ? Coronary atherosclerosis   ? Mild at cardiac catheterization December 2020  ? Essential hypertension   ? GERD (gastroesophageal reflux disease)   ? History of kidney stones   ? Mixed hyperlipidemia   ? Myositis   ? Neuropathy   ? Recurrent kidney stones   ? Type 1 diabetes mellitus ([place])   ? Insulin pump  ? ?Patient Active Problem List  ? Diagnosis Date Noted  ? Anxiety disorder 08/19/2020  ? Diabetic retinopathy ([place]) 08/19/2020  ? Hereditary and idiopathic neuropathy, unspecified 08/19/2020  ? Hyperglycemia due to type 2 diabetes mellitus ([place]) 08/19/2020  ? Presence of insulin pump (external) (internal) 08/19/2020  ? Spinal cord stimulator status 06/15/2019  ? Lumbar radicular pain 05/12/2019  ? Lumbar post-laminectomy syndrome 05/07/2019  ? Trochanteric bursitis of right hip 02/25/2019  ? Body mass index (BMI) 38.0-38.9, adult 01/13/2019  ? Progressive angina ([place])   ? Chronic low back pain 05/29/2017  ? S/P lumbar spinal fusion 07/25/2016  ? DJD (degenerative joint disease) of knee 01/12/2015  ? Groin abscess 02/06/2014  ? Abscess of groin, right  02/06/2014  ? 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 Cyst of buttocks 11/25/2012  ? Mixed hyperlipidemia 07/08/2012  ? Dyspepsia 07/08/2012  ? Precordial pain 05/04/2010  ? Type 2 diabetes mellitus, uncontrolled, with neuropathy 05/04/2010  ? Essential hypertension, benign 05/04/2010  ? ?Past Surgical History:  ?Procedure Laterality Date  ? ABDOMINAL EXPOSURE N/A 07/25/2016  ? Procedure: ABDOMINAL EXPOSURE;  Surgeon: Rosetta Posner, MD;  Location: [facility] OR;  Service: Vascular;  Laterality: N/A;  ? ANTERIOR LUMBAR FUSION N/A 07/25/2016  ? Procedure: LUMBAR FOUR-FIVE ANTERIOR LUMBAR INTERBODY FUSION;  Surgeon: Eustace Moore, MD;  Location: [facility];  Service: Neurosurgery;  Laterality: N/A;  ? BACK SURGERY    ? CARPAL TUNNEL RELEASE    ? DORSAL COMPARTMENT RELEASE Left 12/22/2015  ? Procedure: RELEASE DORSAL COMPARTMENT ([place]), left wrist;  Surgeon: [name], MD;  Location: [facility];  Service: Orthopedics;  Laterality: Left;  block  ? ELBOW SURGERY    ? both tendon repairs( both elbows)  ? INCISION AND DRAINAGE ABSCESS Right 02/06/2014  ? Procedure: INCISION AND DRAINAGE ABSCESS right groin abcess debridement skin and subqutaneous tissue;  Surgeon: Excell Seltzer, MD;  Location: [facility];  Service: General;  Laterality: Right;  ? LEFT HEART CATH AND CORONARY ANGIOGRAPHY N/A 12/16/2018  ? Procedure: LEFT HEART CATH AND CORONARY ANGIOGRAPHY;  Surgeon: Jettie Booze, MD;  Location: [facility];  Service: Cardiovascular;  Laterality: N/A;  ? STERIOD INJECTION Right 12/22/2015  ?  Procedure: STEROID INJECTION;  Surgeon: [name], MD;  Location: [facility];  Service: Orthopedics;  Laterality: Right;  ? TOTAL KNEE ARTHROPLASTY Left 01/12/2015  ? Procedure: LEFT TOTAL KNEE ARTHROPLASTY;  Surgeon: [name], MD;  Location: [facility];  Service: Orthopedics;  Laterality: Left;  ? ?Current Outpatient Medications on File Prior to Visit  ?Medication Sig Dispense Refill  ? Insulin Pen  Needle (BD PEN NEEDLE NANO 2ND GEN) 32G X 4 MM MISC See admin instructions.    ? lidocaine-prilocaine (EMLA) cream Apply to the affected area 1-4 times daily as needed    ? lisinopril (ZESTRIL) 40 MG tablet Take 1 tablet by mouth daily.    ? metFORMIN (GLUCOPHAGE) 1000 MG tablet Take by mouth.    ? omeprazole (PRILOSEC) 20 MG capsule Take by mouth.    ? aspirin EC 81 MG tablet Take 81 mg by mouth daily.    ? Carboxymethylcellul-Glycerin (LUBRICATING EYE DROPS OP) Place 1 drop into both eyes daily as needed (dry eyes).    ? clobetasol cream (TEMOVATE) 5.46 % 1 application    ? colestipol (COLESTID) 1 g tablet TAKE ONE TABLET BY MOUTH TWICE DAILY. 180 tablet 3  ? Continuous Blood Gluc Receiver ([place]) DEVI See admin instructions.    ? gabapentin (NEURONTIN) 300 MG capsule Take 300-600 mg by mouth See admin instructions. Take 300 mg in the morning and 600 mg at night    ? Insulin Human (INSULIN PUMP) SOLN Inject into the skin daily. Insulin Concentrated (Humulin R-500) unit/ml    ? JARDIANCE 25 MG TABS tablet Take 25 mg by mouth daily.    ? lidocaine (XYLOCAINE) 5 % ointment APPLY TO THE AFFECTED AREA 1-4 TIMES DAILY AS NEEDED    ? Lidocaine 5 % CREA Apply 1 application topically daily as needed (muscle pain).    ? lisinopril (ZESTRIL) 40 MG tablet 1 tablet    ? lisinopril (ZESTRIL) 40 MG tablet TAKE ONE TABLET BY MOUTH ONCE DAILY 90 tablet 1  ? metFORMIN (GLUCOPHAGE) 1000 MG tablet Take 1 tablet (1,000 mg total) by mouth 2 (two) times daily.  3  ? metFORMIN (GLUCOPHAGE) 1000 MG tablet Take 1 tablet by mouth 2 (two) times daily.    ? metoprolol succinate (TOPROL XL) 50 MG 24 hr tablet Take 1 tablet (50 mg total) by mouth daily. Take with or immediately following a meal. 90 tablet 3  ? montelukast (SINGULAIR) 10 MG tablet Take 10 mg by mouth daily as needed (allergies).   2  ? MOUNJARO 10 MG/0.5ML Pen Inject into the skin.    ? omeprazole (PRILOSEC) 20 MG capsule Take 1 capsule (20 mg total) by mouth daily.  90 capsule 3  ? ondansetron (ZOFRAN) 4 MG tablet TAKE ONE TABLET BY MOUTH EVERY 6 HOURS AS NEEDED FOR NAUSEA AND VOMITING 30 tablet 1  ? ondansetron (ZOFRAN) 4 MG tablet Take by mouth.    ? Oxycodone HCl 10 MG TABS take 1 tablet by oral route  every 6-8 hours for severe pain    ? pravastatin (PRAVACHOL) 80 MG tablet Take 1 tablet (80 mg total) by mouth daily. 90 tablet 3  ? terbinafine (LAMISIL) 250 MG tablet TAKE ONE TABLET BY MOUTH EVERY DAY 30 tablet 2  ? terbinafine (LAMISIL) 250 MG tablet 1 tablet    ? traZODone (DESYREL) 150 MG tablet Take by mouth at bedtime.    ? TRESIBA FLEXTOUCH 100 UNIT/ML FlexTouch Pen Inject into the skin.    ? ?  No current facility-administered medications on file prior to visit.  ?  ?No Known Allergies ?Social History  ? ?Occupational History  ? Occupation: retired  ?Tobacco Use  ? Smoking status: Never  ? Smokeless tobacco: Never  ?Vaping Use  ? Vaping Use: Never used  ?Substance and Sexual Activity  ? Alcohol use: No  ?  Alcohol/week: 0.0 standard drinks  ? Drug use: No  ? Sexual activity: Not on file  ? ?Family History  ?Problem Relation Age of Onset  ? Asthma Mother   ? Heart disease Father   ? Diabetes Father   ? Asthma Sister   ? Healthy Brother   ? Coronary artery disease Other   ? Leukemia Paternal Grandfather   ? Cirrhosis Maternal Aunt   ? Colon cancer Neg Hx   ? Rectal cancer Neg Hx   ? Esophageal cancer Neg Hx   ? Stomach cancer Neg Hx   ? Liver cancer Neg Hx   ? ?Immunization History  ?Administered Date(s) Administered  ? Influenza Inj Mdck Quad With Preservative 10/20/2018  ? Influenza-Unspecified 09/08/2017  ? Moderna Sars-Covid-2 Vaccination 03/10/2019, 04/07/2019, 01/18/2020  ?  ? ?Review of Systems: Negative except as noted in the HPI.  ? ?Objective: ?There were no vitals filed for this visit. ? ?JEFFEREY LIPPMANN is a pleasant 66 y.o. male in NAD. AAO X 3. ? ?Vascular Examination: ?CFT <3 seconds b/l LE. Palpable DP pulse(s) b/l LE. Palpable PT pulse(s) b/l LE. Pedal  hair absent. No pain with calf compression b/l. Trace edema noted BLE. No cyanosis or clubbing noted b/l LE. ? ?Dermatological Examination: ?Pedal integument with normal turgor, texture and tone BLE. No op

## 2021-05-03 DIAGNOSIS — Z9689 Presence of other specified functional implants: Secondary | ICD-10-CM | POA: Diagnosis not present

## 2021-05-03 DIAGNOSIS — M961 Postlaminectomy syndrome, not elsewhere classified: Secondary | ICD-10-CM | POA: Diagnosis not present

## 2021-05-10 NOTE — Progress Notes (Signed)
? ? ?Cardiology Office Note ? ?Date: 05/11/2021  ? ?ID: David Mcdowell, DOB 08-27-1955, MRN [id number] ? ?PCP:  Jake Samples, PA-C  ?Cardiologist:  Rozann Lesches, MD ?Electrophysiologist:  None  ? ?Chief Complaint  ?Patient presents with  ? Cardiac follow-up  ? ? ?History of Present Illness: ?David Mcdowell is a 66 y.o. male last seen in March 2022.  He is here for a follow-up visit.  He has lost a significant amount of weight since I last saw him.  Currently on Mounjaro with other adjustments in his diabetic regimen and close follow-up with Dr. Chalmers Cater.  He will be having follow-up lab work in June.  States that he feels better overall.  No exertional chest pain. ? ?I reviewed his medications.  ECG today shows normal sinus rhythm.  Cardiac catheterization from 2020 showed only mild, nonobstructive coronary atherosclerosis. ? ?He does mention that he has had intermittent episodes of lightheadedness that sounds postural.  Blood pressure low normal range. ? ?Past Medical History:  ?Diagnosis Date  ? Abscess   ? Anxiety   ? Arthritis   ? Chronic back pain   ? spinal implant  ? Coronary atherosclerosis   ? Mild at cardiac catheterization December 2020  ? Essential hypertension   ? GERD (gastroesophageal reflux disease)   ? History of kidney stones   ? Mixed hyperlipidemia   ? Myositis   ? Neuropathy   ? Recurrent kidney stones   ? Type 1 diabetes mellitus ([place])   ? Insulin pump  ? ? ?Past Surgical History:  ?Procedure Laterality Date  ? ABDOMINAL EXPOSURE N/A 07/25/2016  ? Procedure: ABDOMINAL EXPOSURE;  Surgeon: Rosetta Posner, MD;  Location: [facility] OR;  Service: Vascular;  Laterality: N/A;  ? ANTERIOR LUMBAR FUSION N/A 07/25/2016  ? Procedure: LUMBAR FOUR-FIVE ANTERIOR LUMBAR INTERBODY FUSION;  Surgeon: Eustace Moore, MD;  Location: [place];  Service: Neurosurgery;  Laterality: N/A;  ? BACK SURGERY    ? CARPAL TUNNEL RELEASE    ? DORSAL COMPARTMENT RELEASE Left 12/22/2015  ? Procedure: RELEASE DORSAL COMPARTMENT  (DEQUERVAIN), left wrist;  Surgeon: [name], MD;  Location: [place];  Service: Orthopedics;  Laterality: Left;  block  ? ELBOW SURGERY    ? both tendon repairs( both elbows)  ? INCISION AND DRAINAGE ABSCESS Right 02/06/2014  ? Procedure: INCISION AND DRAINAGE ABSCESS right groin abcess debridement skin and subqutaneous tissue;  Surgeon: Excell Seltzer, MD;  Location: [facility];  Service: General;  Laterality: Right;  ? LEFT HEART CATH AND CORONARY ANGIOGRAPHY N/A 12/16/2018  ? Procedure: LEFT HEART CATH AND CORONARY ANGIOGRAPHY;  Surgeon: Jettie Booze, MD;  Location: [facility];  Service: Cardiovascular;  Laterality: N/A;  ? STERIOD INJECTION Right 12/22/2015  ? Procedure: STEROID INJECTION;  Surgeon: [name], MD;  Location: [place];  Service: Orthopedics;  Laterality: Right;  ? TOTAL KNEE ARTHROPLASTY Left 01/12/2015  ? Procedure: LEFT TOTAL KNEE ARTHROPLASTY;  Surgeon: [name], MD;  Location: [place];  Service: Orthopedics;  Laterality: Left;  ? ? ?Current Outpatient Medications  ?Medication Sig Dispense Refill  ? aspirin EC 81 MG tablet Take 81 mg by mouth daily.    ? Carboxymethylcellul-Glycerin (LUBRICATING EYE DROPS OP) Place 1 drop into both eyes daily as needed (dry eyes).    ? clobetasol cream (TEMOVATE) 0.05 % as needed.    ? colestipol (COLESTID) 1 g tablet TAKE ONE TABLET BY MOUTH TWICE DAILY.  180 tablet 3  ? Continuous Blood Gluc Sensor (DEXCOM G7 SENSOR) MISC     ? gabapentin (NEURONTIN) 300 MG capsule Take 300-600 mg by mouth See admin instructions. Take 300 mg in the morning and 600 mg at night    ? Insulin Pen Needle (BD PEN NEEDLE NANO 2ND GEN) 32G X 4 MM MISC See admin instructions.    ? JARDIANCE 25 MG TABS tablet Take 25 mg by mouth daily.    ? lidocaine (XYLOCAINE) 5 % ointment APPLY TO THE AFFECTED AREA 1-4 TIMES DAILY AS NEEDED    ? Lidocaine 5 % CREA Apply 1 application topically daily as needed (muscle pain).    ?  metFORMIN (GLUCOPHAGE) 1000 MG tablet Take 1 tablet (1,000 mg total) by mouth 2 (two) times daily.  3  ? metoprolol succinate (TOPROL XL) 50 MG 24 hr tablet Take 1 tablet (50 mg total) by mouth daily. Take with or immediately following a meal. 90 tablet 3  ? montelukast (SINGULAIR) 10 MG tablet Take 10 mg by mouth daily as needed (allergies).   2  ? MOUNJARO 10 MG/0.5ML Pen Inject 10 mg into the skin once a week.    ? omeprazole (PRILOSEC) 20 MG capsule Take 1 capsule (20 mg total) by mouth daily. 90 capsule 3  ? ondansetron (ZOFRAN) 4 MG tablet Take 4 mg by mouth as needed.    ? Oxycodone HCl 10 MG TABS take 1 tablet by oral route  every 6-8 hours for severe pain    ? pravastatin (PRAVACHOL) 80 MG tablet Take 1 tablet (80 mg total) by mouth daily. 90 tablet 3  ? terbinafine (LAMISIL) 250 MG tablet Take 250 mg by mouth daily.    ? traZODone (DESYREL) 150 MG tablet Take by mouth at bedtime.    ? TRESIBA FLEXTOUCH 100 UNIT/ML FlexTouch Pen Inject 25 Units into the skin daily.    ? lisinopril (ZESTRIL) 20 MG tablet Take 1 tablet (20 mg total) by mouth daily. 90 tablet 3  ? ?No current facility-administered medications for this visit.  ? ?Allergies:  Patient has no known allergies.  ? ?ROS: Chronic back pain.  No syncope. ? ?Physical Exam: ?VS:  BP 122/80   Pulse 75   Ht 6' (1.829 m)   Wt 240 lb (108.9 kg)   SpO2 98%   BMI 32.55 kg/m? , BMI Body mass index is 32.55 kg/m?. ? ?Wt Readings from Last 3 Encounters:  ?05/11/21 240 lb (108.9 kg)  ?05/11/20 273 lb (123.8 kg)  ?03/17/20 285 lb (129.3 kg)  ?  ?General: Patient appears comfortable at rest. ?HEENT: Conjunctiva and lids normal. ?Neck: Supple, no elevated JVP or carotid bruits. ?Lungs: Clear to auscultation, nonlabored breathing at rest. ?Cardiac: Regular rate and rhythm, no S3 or significant systolic murmur, no pericardial rub. ?Extremities: No pitting edema. ? ?ECG:  An ECG dated 03/17/2020 was personally reviewed today and demonstrated:  Sinus tachycardia with  PVC, nonspecific T wave changes. ? ?Recent Labwork: ?07/15/2020: ALT 14; AST 14  ? ?Other Studies Reviewed Today: ? ?Cardiac catheterization 12/16/2018: ?1st Mrg lesion is 25% stenosed. ?Mid RCA lesion is 20% stenosed. ?The left ventricular systolic function is normal. ?LV end diastolic pressure is mildly elevated. ?The left ventricular ejection fraction is 50-55% by visual estimate. ?There is no aortic valve stenosis. ?  ?Nonobstructive, mild CAD.  ?  ?May benefit from diuretic given elevated LVEDP.  Stressed importance of risk factor modification to prevent progression of CAD.  ?  ?Echocardiogram  03/30/2013: ?Study Conclusions  ? ?- Left ventricle: The cavity size was normal. Systolic  ?  function was normal. The estimated ejection fraction was  ?  in the range of 55% to 60%. Doppler parameters are  ?  consistent with abnormal left ventricular relaxation  ?  (grade 1 diastolic dysfunction). Mild to moderate left  ?  ventricular hypertrophy.  ?- Aortic valve: Mildly thickened, mildly calcified leaflets.  ?  There was no stenosis.  ?- Mitral valve: Calcified annulus. Mildly thickened leaflets  ?  Marland Kitchen No significant regurgitation.  ? ?Assessment and Plan: ? ?1.  Mild coronary atherosclerosis being managed medically.  No angina symptoms.  ECG is normal today.  Continue aspirin, Toprol-XL, lisinopril and Pravachol. ? ?2.  Diabetes mellitus, doing well with close follow-up by Dr. Chalmers Cater.  Regimen has been adjusted and he is lost a significant amount of weight on Mounjaro.  His last hemoglobin A1c was under 6%.  We will have follow-up lab work soon for his yearly check at which point lipids will also be obtained. ? ?3.  History of essential hypertension.  He has had some postural lightheadedness.  Plan to reduce lisinopril to 20 mg daily for now.  Blood pressure trend likely down with significant weight loss. ? ?Medication Adjustments/Labs and Tests Ordered: ?Current medicines are reviewed at length with the patient today.   Concerns regarding medicines are outlined above.  ? ?Tests Ordered: ?Orders Placed This Encounter  ?Procedures  ? EKG 12-Lead  ? ? ?Medication Changes: ?Meds ordered this encounter  ?Medications  ? lisinopril (ZESTRIL) 20

## 2021-05-11 ENCOUNTER — Ambulatory Visit: Payer: Medicare PPO | Admitting: Cardiology

## 2021-05-11 ENCOUNTER — Encounter: Payer: Self-pay | Admitting: Cardiology

## 2021-05-11 VITALS — BP 122/80 | HR 75 | Ht 72.0 in | Wt 240.0 lb

## 2021-05-11 DIAGNOSIS — I10 Essential (primary) hypertension: Secondary | ICD-10-CM

## 2021-05-11 DIAGNOSIS — I2510 Atherosclerotic heart disease of native coronary artery without angina pectoris: Secondary | ICD-10-CM

## 2021-05-11 MED ORDER — LISINOPRIL 20 MG PO TABS: 20.0000 mg | ORAL_TABLET | Freq: Every day | ORAL | 3 refills | Status: DC

## 2021-05-11 NOTE — Patient Instructions (Addendum)
Medication Instructions:  ?Your physician has recommended you make the following change in your medication:  ?Take lisinopril 20 mg once a day ?Continue all other medications as prescribed ? ?Labwork: ?none ? ?Testing/Procedures: ?none ? ?Follow-Up: ?Your physician recommends that you schedule a follow-up appointment in: 1 year ? ?Any Other Special Instructions Will Be Listed Below (If Applicable). ? ?You will receive a reminder call in about 10 months reminding you to schedule your appointment. If you don't receive this call, please contact our office. ? ? ?If you need a refill on your cardiac medications before your next appointment, please call your pharmacy.  ?

## 2021-05-12 DIAGNOSIS — E1165 Type 2 diabetes mellitus with hyperglycemia: Secondary | ICD-10-CM | POA: Diagnosis not present

## 2021-05-18 ENCOUNTER — Ambulatory Visit: Payer: Medicare PPO | Admitting: Student

## 2021-05-22 ENCOUNTER — Telehealth: Payer: Self-pay

## 2021-05-22 NOTE — Telephone Encounter (Signed)
DOS 06/19/2021 ? ?PLANTAR FIBROMA LT - 28062 ? ?[employer] ? ?The following codes do not require a pre-authorization ?Created on 05/22/2021 ? ?Payer ?[employer] ? ?Plan Year ?01/09/2019 - 01/07/9998 ? ?Service info ?[phone] ?Fasciectomy, plantar fascia; radical (separate procedure) ?

## 2021-05-25 DIAGNOSIS — R69 Illness, unspecified: Secondary | ICD-10-CM | POA: Diagnosis not present

## 2021-05-30 ENCOUNTER — Other Ambulatory Visit: Payer: Self-pay | Admitting: Nurse Practitioner

## 2021-06-02 DIAGNOSIS — H25013 Cortical age-related cataract, bilateral: Secondary | ICD-10-CM | POA: Diagnosis not present

## 2021-06-09 DIAGNOSIS — J069 Acute upper respiratory infection, unspecified: Secondary | ICD-10-CM | POA: Diagnosis not present

## 2021-06-13 ENCOUNTER — Other Ambulatory Visit: Payer: Self-pay | Admitting: Cardiology

## 2021-06-15 DIAGNOSIS — H2513 Age-related nuclear cataract, bilateral: Secondary | ICD-10-CM | POA: Diagnosis not present

## 2021-06-19 ENCOUNTER — Encounter: Payer: Self-pay | Admitting: Podiatry

## 2021-06-19 DIAGNOSIS — M722 Plantar fascial fibromatosis: Secondary | ICD-10-CM | POA: Diagnosis not present

## 2021-06-19 DIAGNOSIS — D2122 Benign neoplasm of connective and other soft tissue of left lower limb, including hip: Secondary | ICD-10-CM | POA: Diagnosis not present

## 2021-06-19 DIAGNOSIS — D1724 Benign lipomatous neoplasm of skin and subcutaneous tissue of left leg: Secondary | ICD-10-CM | POA: Diagnosis not present

## 2021-06-26 ENCOUNTER — Encounter: Payer: Self-pay | Admitting: Podiatry

## 2021-06-28 ENCOUNTER — Ambulatory Visit (INDEPENDENT_AMBULATORY_CARE_PROVIDER_SITE_OTHER): Payer: Medicare PPO | Admitting: Podiatry

## 2021-06-28 ENCOUNTER — Telehealth: Payer: Self-pay | Admitting: *Deleted

## 2021-06-28 DIAGNOSIS — M722 Plantar fascial fibromatosis: Secondary | ICD-10-CM

## 2021-06-28 DIAGNOSIS — Z9889 Other specified postprocedural states: Secondary | ICD-10-CM

## 2021-06-28 NOTE — Telephone Encounter (Signed)
Patient is calling to ask if he can remove bandages and take a shower, then re wrap. Please advise.

## 2021-06-29 DIAGNOSIS — I10 Essential (primary) hypertension: Secondary | ICD-10-CM | POA: Diagnosis not present

## 2021-06-29 DIAGNOSIS — Z9641 Presence of insulin pump (external) (internal): Secondary | ICD-10-CM | POA: Diagnosis not present

## 2021-06-29 DIAGNOSIS — G609 Hereditary and idiopathic neuropathy, unspecified: Secondary | ICD-10-CM | POA: Diagnosis not present

## 2021-06-29 DIAGNOSIS — F419 Anxiety disorder, unspecified: Secondary | ICD-10-CM | POA: Diagnosis not present

## 2021-06-29 DIAGNOSIS — E11319 Type 2 diabetes mellitus with unspecified diabetic retinopathy without macular edema: Secondary | ICD-10-CM | POA: Diagnosis not present

## 2021-06-29 DIAGNOSIS — E1165 Type 2 diabetes mellitus with hyperglycemia: Secondary | ICD-10-CM | POA: Diagnosis not present

## 2021-06-29 DIAGNOSIS — E7800 Pure hypercholesterolemia, unspecified: Secondary | ICD-10-CM | POA: Diagnosis not present

## 2021-07-05 DIAGNOSIS — H2512 Age-related nuclear cataract, left eye: Secondary | ICD-10-CM | POA: Diagnosis not present

## 2021-07-05 DIAGNOSIS — H269 Unspecified cataract: Secondary | ICD-10-CM | POA: Diagnosis not present

## 2021-07-05 DIAGNOSIS — H25012 Cortical age-related cataract, left eye: Secondary | ICD-10-CM | POA: Diagnosis not present

## 2021-07-05 DIAGNOSIS — H268 Other specified cataract: Secondary | ICD-10-CM | POA: Diagnosis not present

## 2021-07-10 ENCOUNTER — Other Ambulatory Visit: Payer: Self-pay | Admitting: Nurse Practitioner

## 2021-07-12 ENCOUNTER — Ambulatory Visit (INDEPENDENT_AMBULATORY_CARE_PROVIDER_SITE_OTHER): Payer: Medicare PPO | Admitting: Podiatry

## 2021-07-12 DIAGNOSIS — M722 Plantar fascial fibromatosis: Secondary | ICD-10-CM

## 2021-07-12 DIAGNOSIS — Z9889 Other specified postprocedural states: Secondary | ICD-10-CM

## 2021-07-12 NOTE — Progress Notes (Signed)
Subjective:  Patient ID: David Mcdowell, male    DOB: 03-15-1955,  MRN: [id number]  Chief Complaint  Patient presents with   Routine Post Op    POV #2 DOS 06/19/2021 LT EXCISION OF PLANTAR FIBROMA    DOS: 06/19/2021 Procedure: Left excision of plantar fibroma  66 y.o. male returns for post-op check.  Patient presents with follow-up of above listed procedure.  He states is doing okay.  He has been partial weightbearing to the.  He denies any other acute distress  Review of Systems: Negative except as noted in the HPI. Denies N/V/F/Ch.  Past Medical History:  Diagnosis Date   Abscess    Anxiety    Arthritis    Chronic back pain    spinal implant   Coronary atherosclerosis    Mild at cardiac catheterization December 2020   Essential hypertension    GERD (gastroesophageal reflux disease)    History of kidney stones    Mixed hyperlipidemia    Myositis    Neuropathy    Recurrent kidney stones    Type 1 diabetes mellitus (HCC)    Insulin pump    Current Outpatient Medications:    aspirin EC 81 MG tablet, Take 81 mg by mouth daily., Disp: , Rfl:    Carboxymethylcellul-Glycerin (LUBRICATING EYE DROPS OP), Place 1 drop into both eyes daily as needed (dry eyes)., Disp: , Rfl:    clobetasol cream (TEMOVATE) 0.05 %, as needed., Disp: , Rfl:    colestipol (COLESTID) 1 g tablet, TAKE ONE TABLET BY MOUTH TWICE DAILY. **PLEASE CALL OFFICE TO SCHEDULE FOLLOW UP, Disp: 180 tablet, Rfl: 0   Continuous Blood Gluc Sensor (DEXCOM G7 SENSOR) MISC, , Disp: , Rfl:    gabapentin (NEURONTIN) 300 MG capsule, Take 300-600 mg by mouth See admin instructions. Take 300 mg in the morning and 600 mg at night, Disp: , Rfl:    Insulin Pen Needle (BD PEN NEEDLE NANO 2ND GEN) 32G X 4 MM MISC, See admin instructions., Disp: , Rfl:    JARDIANCE 25 MG TABS tablet, Take 25 mg by mouth daily., Disp: , Rfl:    lidocaine (XYLOCAINE) 5 % ointment, APPLY TO THE AFFECTED AREA 1-4 TIMES DAILY AS NEEDED, Disp: , Rfl:     Lidocaine 5 % CREA, Apply 1 application topically daily as needed (muscle pain)., Disp: , Rfl:    lisinopril (ZESTRIL) 20 MG tablet, Take 1 tablet (20 mg total) by mouth daily., Disp: 90 tablet, Rfl: 3   metFORMIN (GLUCOPHAGE) 1000 MG tablet, Take 1 tablet (1,000 mg total) by mouth 2 (two) times daily., Disp: , Rfl: 3   metoprolol succinate (TOPROL-XL) 50 MG 24 hr tablet, TAKE ONE TABLET BY MOUTH DAILY. TAKE WITH OR IMMEDIATELY AFTER A MEAL, Disp: 90 tablet, Rfl: 3   montelukast (SINGULAIR) 10 MG tablet, Take 10 mg by mouth daily as needed (allergies). , Disp: , Rfl: 2   MOUNJARO 10 MG/0.5ML Pen, Inject 10 mg into the skin once a week., Disp: , Rfl:    omeprazole (PRILOSEC) 20 MG capsule, Take 1 capsule (20 mg total) by mouth daily. **PATIENT NEEDS TO SCHEDULE FOLLOW UP, Disp: 90 capsule, Rfl: 0   ondansetron (ZOFRAN) 4 MG tablet, Take 4 mg by mouth as needed., Disp: , Rfl:    Oxycodone HCl 10 MG TABS, take 1 tablet by oral route  every 6-8 hours for severe pain, Disp: , Rfl:    pravastatin (PRAVACHOL) 80 MG tablet, Take 1 tablet (80 mg total) by  mouth daily., Disp: 90 tablet, Rfl: 3   terbinafine (LAMISIL) 250 MG tablet, Take 250 mg by mouth daily., Disp: , Rfl:    traZODone (DESYREL) 150 MG tablet, Take by mouth at bedtime., Disp: , Rfl:    TRESIBA FLEXTOUCH 100 UNIT/ML FlexTouch Pen, Inject 25 Units into the skin daily., Disp: , Rfl:   Social History   Tobacco Use  Smoking Status Never  Smokeless Tobacco Never    No Known Allergies Objective:  There were no vitals filed for this visit. There is no height or weight on file to calculate BMI. Constitutional Well developed. Well nourished.  Vascular Foot warm and well perfused. Capillary refill normal to all digits.   Neurologic Normal speech. Oriented to person, place, and time. Epicritic sensation to light touch grossly present bilaterally.  Dermatologic S skin completely epithelialized.  No clinical signs of Deis is noted no  complication noted.  Orthopedic: No further tenderness to palpation noted about the surgical site.   Radiographs: None Assessment:   1. Plantar fibromatosis   2. Status post foot surgery     Plan:  Patient was evaluated and treated and all questions answered.  S/p foot surgery left -Clinically healed incision completely reepithelialized.  No clinical signs of recurrence noted.  Discussed shoe gear modification briefly.  He states understanding if any foot and ankle issues on future advised him come back and see me  No follow-ups on file.

## 2021-07-18 DIAGNOSIS — M961 Postlaminectomy syndrome, not elsewhere classified: Secondary | ICD-10-CM | POA: Diagnosis not present

## 2021-07-18 DIAGNOSIS — Z9689 Presence of other specified functional implants: Secondary | ICD-10-CM | POA: Diagnosis not present

## 2021-07-19 ENCOUNTER — Ambulatory Visit (INDEPENDENT_AMBULATORY_CARE_PROVIDER_SITE_OTHER): Payer: Medicare PPO | Admitting: Podiatry

## 2021-07-19 DIAGNOSIS — B351 Tinea unguium: Secondary | ICD-10-CM | POA: Diagnosis not present

## 2021-07-19 DIAGNOSIS — E1142 Type 2 diabetes mellitus with diabetic polyneuropathy: Secondary | ICD-10-CM | POA: Diagnosis not present

## 2021-07-19 DIAGNOSIS — L603 Nail dystrophy: Secondary | ICD-10-CM

## 2021-07-23 ENCOUNTER — Encounter: Payer: Self-pay | Admitting: Podiatry

## 2021-07-23 NOTE — Progress Notes (Signed)
  Subjective:  Patient ID: David Mcdowell, male    DOB: 1955/11/08,  MRN: [id number]  Fanny Dance presents to clinic today for at risk foot care with history of diabetic neuropathy and he is seen for painful koilonychia of toenails. Patient c/o pressure of pitted toenails which is relieved with debulking of toenails.  He recently had surgery with Dr. Posey Pronto for removal of plantar fibromas of the left foot. He is extremely pleased stating he has been able to resume his walking regimen. He walked 1.9 miles on his last walk.  Patient states blood glucose was 164 mg/dl today.  Last A1c was 5.3%.  New problem(s): None.   PCP is Jake Samples, PA-C , and last visit was 2022  No Known Allergies  Review of Systems: Negative except as noted in the HPI.  Objective: No changes noted in today's physical examination. There were no vitals filed for this visit.  RAIHAN KIMMEL is a pleasant 66 y.o. male in NAD. AAO X 3.  Vascular Examination: CFT <3 seconds b/l LE. Palpable DP pulse(s) b/l LE. Palpable PT pulse(s) b/l LE. Pedal hair absent. No pain with calf compression b/l. Trace edema noted BLE. No cyanosis or clubbing noted b/l LE.  Dermatological Examination: Pedal integument with normal turgor, texture and tone BLE. No open wounds b/l LE. No interdigital macerations noted b/l LE. Spoon shaped nails 1.5 b/l with centrally depressed nailplate and elevated periphery of mycotic nailplates. There is tenderness to palpation. Well healed longitudinal surgical scar from removal of plantar fibroma of left foot.  Neurological Examination: Protective sensation intact 5/5 intact bilaterally with 10g monofilament b/l. Vibratory sensation intact b/l.  Musculoskeletal Examination: Muscle strength 5/5 to all lower extremity muscle groups bilaterally.   Assessment/Plan: 1. Onychomycosis   2. Koilonychia   3. Diabetic peripheral neuropathy associated with type 2 diabetes mellitus ([place])      -Examined patient. -Continue foot and shoe inspections daily. Monitor blood glucose per PCP/Endocrinologist's recommendations. -Toenails 1-5 bilaterally were debulked in girth with dremel. Pinpoint bleeding of R 3rd toe addressed with Lumicain Hemostatic Solution, cleansed with alcohol. triple antibiotic ointment applied. No further treatment required by patient/caregiver. -Patient/POA to call should there be question/concern in the interim.   Return in about 9 weeks (around 09/20/2021).  Marzetta Board, DPM

## 2021-07-25 ENCOUNTER — Ambulatory Visit: Payer: Medicare PPO | Admitting: Podiatry

## 2021-08-02 DIAGNOSIS — E1165 Type 2 diabetes mellitus with hyperglycemia: Secondary | ICD-10-CM | POA: Diagnosis not present

## 2021-08-16 DIAGNOSIS — H269 Unspecified cataract: Secondary | ICD-10-CM | POA: Diagnosis not present

## 2021-08-16 DIAGNOSIS — H2511 Age-related nuclear cataract, right eye: Secondary | ICD-10-CM | POA: Diagnosis not present

## 2021-09-04 DIAGNOSIS — H35372 Puckering of macula, left eye: Secondary | ICD-10-CM | POA: Diagnosis not present

## 2021-09-04 DIAGNOSIS — H43813 Vitreous degeneration, bilateral: Secondary | ICD-10-CM | POA: Diagnosis not present

## 2021-09-04 DIAGNOSIS — H3582 Retinal ischemia: Secondary | ICD-10-CM | POA: Diagnosis not present

## 2021-09-04 DIAGNOSIS — E113513 Type 2 diabetes mellitus with proliferative diabetic retinopathy with macular edema, bilateral: Secondary | ICD-10-CM | POA: Diagnosis not present

## 2021-09-18 ENCOUNTER — Other Ambulatory Visit: Payer: Self-pay | Admitting: Cardiology

## 2021-09-27 DIAGNOSIS — Z125 Encounter for screening for malignant neoplasm of prostate: Secondary | ICD-10-CM | POA: Diagnosis not present

## 2021-09-27 DIAGNOSIS — E1165 Type 2 diabetes mellitus with hyperglycemia: Secondary | ICD-10-CM | POA: Diagnosis not present

## 2021-09-27 DIAGNOSIS — E7800 Pure hypercholesterolemia, unspecified: Secondary | ICD-10-CM | POA: Diagnosis not present

## 2021-09-27 DIAGNOSIS — G609 Hereditary and idiopathic neuropathy, unspecified: Secondary | ICD-10-CM | POA: Diagnosis not present

## 2021-10-04 DIAGNOSIS — I10 Essential (primary) hypertension: Secondary | ICD-10-CM | POA: Diagnosis not present

## 2021-10-04 DIAGNOSIS — Z23 Encounter for immunization: Secondary | ICD-10-CM | POA: Diagnosis not present

## 2021-10-04 DIAGNOSIS — E538 Deficiency of other specified B group vitamins: Secondary | ICD-10-CM | POA: Diagnosis not present

## 2021-10-04 DIAGNOSIS — E7800 Pure hypercholesterolemia, unspecified: Secondary | ICD-10-CM | POA: Diagnosis not present

## 2021-10-04 DIAGNOSIS — F419 Anxiety disorder, unspecified: Secondary | ICD-10-CM | POA: Diagnosis not present

## 2021-10-04 DIAGNOSIS — E1165 Type 2 diabetes mellitus with hyperglycemia: Secondary | ICD-10-CM | POA: Diagnosis not present

## 2021-10-04 DIAGNOSIS — G609 Hereditary and idiopathic neuropathy, unspecified: Secondary | ICD-10-CM | POA: Diagnosis not present

## 2021-10-04 DIAGNOSIS — E11319 Type 2 diabetes mellitus with unspecified diabetic retinopathy without macular edema: Secondary | ICD-10-CM | POA: Diagnosis not present

## 2021-10-10 DIAGNOSIS — Z9689 Presence of other specified functional implants: Secondary | ICD-10-CM | POA: Diagnosis not present

## 2021-10-10 DIAGNOSIS — Z6831 Body mass index (BMI) 31.0-31.9, adult: Secondary | ICD-10-CM | POA: Diagnosis not present

## 2021-10-10 DIAGNOSIS — M5416 Radiculopathy, lumbar region: Secondary | ICD-10-CM | POA: Diagnosis not present

## 2021-10-10 DIAGNOSIS — M961 Postlaminectomy syndrome, not elsewhere classified: Secondary | ICD-10-CM | POA: Diagnosis not present

## 2021-10-18 DIAGNOSIS — M7711 Lateral epicondylitis, right elbow: Secondary | ICD-10-CM | POA: Diagnosis not present

## 2021-10-19 DIAGNOSIS — E782 Mixed hyperlipidemia: Secondary | ICD-10-CM | POA: Diagnosis not present

## 2021-10-19 DIAGNOSIS — Z6831 Body mass index (BMI) 31.0-31.9, adult: Secondary | ICD-10-CM | POA: Diagnosis not present

## 2021-10-19 DIAGNOSIS — I10 Essential (primary) hypertension: Secondary | ICD-10-CM | POA: Diagnosis not present

## 2021-10-19 DIAGNOSIS — E11319 Type 2 diabetes mellitus with unspecified diabetic retinopathy without macular edema: Secondary | ICD-10-CM | POA: Diagnosis not present

## 2021-10-19 DIAGNOSIS — Z0001 Encounter for general adult medical examination with abnormal findings: Secondary | ICD-10-CM | POA: Diagnosis not present

## 2021-10-19 DIAGNOSIS — E6609 Other obesity due to excess calories: Secondary | ICD-10-CM | POA: Diagnosis not present

## 2021-10-19 DIAGNOSIS — G589 Mononeuropathy, unspecified: Secondary | ICD-10-CM | POA: Diagnosis not present

## 2021-10-19 DIAGNOSIS — E1140 Type 2 diabetes mellitus with diabetic neuropathy, unspecified: Secondary | ICD-10-CM | POA: Diagnosis not present

## 2021-10-19 DIAGNOSIS — G894 Chronic pain syndrome: Secondary | ICD-10-CM | POA: Diagnosis not present

## 2021-10-21 DIAGNOSIS — E1165 Type 2 diabetes mellitus with hyperglycemia: Secondary | ICD-10-CM | POA: Diagnosis not present

## 2021-10-23 ENCOUNTER — Ambulatory Visit: Payer: Medicare PPO | Admitting: Podiatry

## 2021-10-23 ENCOUNTER — Encounter: Payer: Self-pay | Admitting: Podiatry

## 2021-10-23 DIAGNOSIS — B351 Tinea unguium: Secondary | ICD-10-CM

## 2021-10-23 DIAGNOSIS — E1142 Type 2 diabetes mellitus with diabetic polyneuropathy: Secondary | ICD-10-CM | POA: Diagnosis not present

## 2021-10-23 DIAGNOSIS — L84 Corns and callosities: Secondary | ICD-10-CM

## 2021-10-23 DIAGNOSIS — L603 Nail dystrophy: Secondary | ICD-10-CM

## 2021-10-27 NOTE — Progress Notes (Signed)
  Subjective:  Patient ID: David Mcdowell, male    DOB: [birth year],  MRN: [id number]  Fanny Dance presents to clinic today for:  Chief Complaint  Patient presents with   Nail Problem    Diabetic foot care BS-128 A1C-6.3 Delaine Lame PCP VST-2 weeks ago   New problem(s): None.   PCP is Jake Samples, PA-C.  No Known Allergies  Review of Systems: Negative except as noted in the HPI.  Objective:   NORIEL GUTHRIE is a pleasant 66 y.o. male in NAD. AAO x 3.  Vascular Examination: CFT <3 seconds b/l LE. Palpable DP pulse(s) b/l LE. Palpable PT pulse(s) b/l LE. Pedal hair absent. No pain with calf compression b/l. Trace edema noted BLE. No cyanosis or clubbing noted b/l LE.  Dermatological Examination: Pedal integument with normal turgor, texture and tone BLE. No open wounds b/l LE. No interdigital macerations noted b/l LE. Spoon shaped nails 1.5 b/l with centrally depressed nailplate and elevated periphery of mycotic nailplates. There is tenderness to palpation.   Well healed longitudinal surgical scar from removal of plantar fibroma of left foot. He has a small amount of hyperkeratotic tissue at the distal aspect of the scar. No erythema, no edema, no drainage, no fluctuance.   Neurological Examination: Protective sensation intact 5/5 intact bilaterally with 10g monofilament b/l. Vibratory sensation intact b/l.  Musculoskeletal Examination: Muscle strength 5/5 to all lower extremity muscle groups bilaterally.  Assessment/Plan: 1. Onychomycosis   2. Koilonychia   3. Plantar callus   4. Diabetic peripheral neuropathy associated with type 2 diabetes mellitus ([place])     No orders of the defined types were placed in this encounter.   -Consent given for treatment as described below: -Examined patient. -Patient informed about small hyperkeratotic lesion distal aspect of the surgical scar. I was able to gently file the area smooth without incident. Callus pared x  1 plantar aspect left foot. If scar should become symptomatic, he will see Dr. Posey Pronto. -Patient to continue soft, supportive shoe gear daily. -Mycotic toenails 1-5 bilaterally were debrided in length and girth withdremel without incident. Patient noted relief after today's treatment. -Patient/POA to call should there be question/concern in the interim.   Return in about 3 months (around 01/23/2022).  Marzetta Board, DPM

## 2021-11-15 ENCOUNTER — Ambulatory Visit: Payer: Medicare PPO | Admitting: Podiatry

## 2021-11-15 ENCOUNTER — Other Ambulatory Visit: Payer: Self-pay | Admitting: Nurse Practitioner

## 2021-11-15 DIAGNOSIS — Q828 Other specified congenital malformations of skin: Secondary | ICD-10-CM

## 2021-11-15 DIAGNOSIS — M778 Other enthesopathies, not elsewhere classified: Secondary | ICD-10-CM

## 2021-11-15 NOTE — Progress Notes (Signed)
Subjective:  Patient ID: David Mcdowell, male    DOB: 06-07-1955,  MRN: [id number]  Chief Complaint  Patient presents with   Injections    66 y.o. male presents with the above complaint.  Patient presents with complaint of left plantar midfoot benign skin lesion/porokeratotic lesion patient states pain for touch is progressive gotten worse.  Hurts with ambulation there is tenderness better core.  Has not tried anything for it.  He would like to to help address this.  Hurts with pressure hurts with ambulation.  She was followed at 10   Review of Systems: Negative except as noted in the HPI. Denies N/V/F/Ch.  Past Medical History:  Diagnosis Date   Abscess    Anxiety    Arthritis    Chronic back pain    spinal implant   Coronary atherosclerosis    Mild at cardiac catheterization December 2020   Essential hypertension    GERD (gastroesophageal reflux disease)    History of kidney stones    Mixed hyperlipidemia    Myositis    Neuropathy    Recurrent kidney stones    Type 1 diabetes mellitus ([facility])    Insulin pump    Current Outpatient Medications:    amoxicillin (AMOXIL) 500 MG capsule, SMARTSIG:4 Capsule(s) By Mouth, Disp: , Rfl:    amoxicillin-clavulanate (AUGMENTIN) 875-125 MG tablet, Take 1 tablet by mouth 2 (two) times daily., Disp: , Rfl:    aspirin EC 81 MG tablet, Take 81 mg by mouth daily., Disp: , Rfl:    Carboxymethylcellul-Glycerin (LUBRICATING EYE DROPS OP), Place 1 drop into both eyes daily as needed (dry eyes)., Disp: , Rfl:    clobetasol cream (TEMOVATE) 0.05 %, as needed., Disp: , Rfl:    colestipol (COLESTID) 1 g tablet, TAKE ONE TABLET BY MOUTH TWICE DAILY. **PLEASE CALL OFFICE TO SCHEDULE FOLLOW UP, Disp: 180 tablet, Rfl: 0   Continuous Blood Gluc Sensor (DEXCOM G7 SENSOR) MISC, , Disp: , Rfl:    gabapentin (NEURONTIN) 300 MG capsule, Take 300-600 mg by mouth See admin instructions. Take 300 mg in the morning and 600 mg at night, Disp: , Rfl:    Insulin Pen  Needle (BD PEN NEEDLE NANO 2ND GEN) 32G X 4 MM MISC, See admin instructions., Disp: , Rfl:    JARDIANCE 25 MG TABS tablet, Take 25 mg by mouth daily., Disp: , Rfl:    lidocaine (XYLOCAINE) 5 % ointment, APPLY TO THE AFFECTED AREA 1-4 TIMES DAILY AS NEEDED, Disp: , Rfl:    Lidocaine 5 % CREA, Apply 1 application topically daily as needed (muscle pain)., Disp: , Rfl:    lisinopril (ZESTRIL) 2.5 MG tablet, Take 2.5 mg by mouth daily., Disp: , Rfl:    metFORMIN (GLUCOPHAGE) 1000 MG tablet, Take 1 tablet (1,000 mg total) by mouth 2 (two) times daily., Disp: , Rfl: 3   metoprolol succinate (TOPROL-XL) 50 MG 24 hr tablet, TAKE ONE TABLET BY MOUTH DAILY. TAKE WITH OR IMMEDIATELY AFTER A MEAL, Disp: 90 tablet, Rfl: 3   montelukast (SINGULAIR) 10 MG tablet, Take 10 mg by mouth daily as needed (allergies). , Disp: , Rfl: 2   MOUNJARO 10 MG/0.5ML Pen, Inject 10 mg into the skin once a week., Disp: , Rfl:    omeprazole (PRILOSEC) 20 MG capsule, Take 1 capsule (20 mg total) by mouth daily. **PATIENT NEEDS TO SCHEDULE FOLLOW UP, Disp: 90 capsule, Rfl: 0   ondansetron (ZOFRAN) 4 MG tablet, Take 4 mg by mouth as needed., Disp: ,  Rfl:    Oxycodone HCl 10 MG TABS, take 1 tablet by oral route  every 6-8 hours for severe pain, Disp: , Rfl:    pravastatin (PRAVACHOL) 80 MG tablet, TAKE ONE TABLET BY MOUTH DAILY, Disp: 90 tablet, Rfl: 3   traZODone (DESYREL) 150 MG tablet, Take by mouth at bedtime., Disp: , Rfl:    TRESIBA FLEXTOUCH 100 UNIT/ML FlexTouch Pen, Inject 25 Units into the skin daily., Disp: , Rfl:   Social History   Tobacco Use  Smoking Status Never  Smokeless Tobacco Never    No Known Allergies Objective:  There were no vitals filed for this visit. There is no height or weight on file to calculate BMI. Constitutional Well developed. Well nourished.  Vascular Dorsalis pedis pulses palpable bilaterally. Posterior tibial pulses palpable bilaterally. Capillary refill normal to all digits.  No  cyanosis or clubbing noted. Pedal hair growth normal.  Neurologic Normal speech. Oriented to person, place, and time. Epicritic sensation to light touch grossly present bilaterally.  Dermatologic Hyperkeratotic lesion with central nucleated core noted to the left plantar midfoot.  Pain on palpation pain with underlying capsulitis.  No other abnormalities clinically appreciated no signs of recurrence plantar fibroma noted.  Orthopedic: Normal joint ROM without pain or crepitus bilaterally. No visible deformities. No bony tenderness.   Radiographs: None Assessment:   1. Capsulitis of left foot   2. Porokeratosis    Plan:  Patient was evaluated and treated and all questions answered.  Left plantar midfoot porokeratosis with underlying capsulitis -I explained the patient the etiology of porokeratosis and capsulitis and worse treatment options were discussed.  Given the amount of pain that he is having a benefit from steroid injection of decrease inflammatory component associate with pain upon debridement of the lesion.  Patient agrees with plan to proceed with that. -A steroid injection was performed at left plantar midfoot using 1% plain Lidocaine and 10 mg of Kenalog. This was well tolerated. -As a courtesy of the lesion was debrided down to healthy scar tissue followed by excision of central nucleated core.  No complication noted no pinpoint bleeding noted.  No follow-ups on file.

## 2021-11-20 ENCOUNTER — Other Ambulatory Visit: Payer: Self-pay | Admitting: Nurse Practitioner

## 2021-11-20 NOTE — Telephone Encounter (Signed)
Please advise 

## 2021-12-06 DIAGNOSIS — M7711 Lateral epicondylitis, right elbow: Secondary | ICD-10-CM | POA: Diagnosis not present

## 2021-12-11 DIAGNOSIS — M5416 Radiculopathy, lumbar region: Secondary | ICD-10-CM | POA: Diagnosis not present

## 2021-12-21 DIAGNOSIS — H33311 Horseshoe tear of retina without detachment, right eye: Secondary | ICD-10-CM | POA: Diagnosis not present

## 2021-12-21 DIAGNOSIS — E113513 Type 2 diabetes mellitus with proliferative diabetic retinopathy with macular edema, bilateral: Secondary | ICD-10-CM | POA: Diagnosis not present

## 2021-12-21 DIAGNOSIS — H4311 Vitreous hemorrhage, right eye: Secondary | ICD-10-CM | POA: Diagnosis not present

## 2021-12-25 DIAGNOSIS — M961 Postlaminectomy syndrome, not elsewhere classified: Secondary | ICD-10-CM | POA: Diagnosis not present

## 2021-12-25 DIAGNOSIS — Z9689 Presence of other specified functional implants: Secondary | ICD-10-CM | POA: Diagnosis not present

## 2021-12-25 DIAGNOSIS — M5416 Radiculopathy, lumbar region: Secondary | ICD-10-CM | POA: Diagnosis not present

## 2021-12-26 ENCOUNTER — Ambulatory Visit: Payer: Medicare PPO | Admitting: Nurse Practitioner

## 2021-12-26 ENCOUNTER — Encounter: Payer: Self-pay | Admitting: Nurse Practitioner

## 2021-12-26 VITALS — BP 122/68 | HR 94 | Ht 72.0 in | Wt 222.8 lb

## 2021-12-26 DIAGNOSIS — K219 Gastro-esophageal reflux disease without esophagitis: Secondary | ICD-10-CM

## 2021-12-26 MED ORDER — OMEPRAZOLE 20 MG PO CPDR: 20.0000 mg | DELAYED_RELEASE_CAPSULE | Freq: Every day | ORAL | 3 refills | Status: DC

## 2021-12-26 MED ORDER — COLESTIPOL HCL 1 G PO TABS: ORAL_TABLET | ORAL | 3 refills | Status: DC

## 2021-12-26 NOTE — Progress Notes (Signed)
12/26/2021 David Mcdowell [id number] 1955/04/08   Chief Complaint: Medication refill   History of Present Illness: David Mcdowell. [name] is a 66 year old male with a past medical history of arthritis, chronic back pain, coronary atherosclerosis, hypertension, hyperlipidemia, kidney stones, vitamin B12 deficiency, diabetes mellitus type II and diarrhea predominant IBS. He is followed by Dr. Havery Moros. He presents to our office today for his annual review.  He reports feeling quite well.  He lost 71 pounds on Mounjaro which she started 1-1/2 years ago and he longer requires insulin.  His blood pressure improved and he is no longer needing antihypertensive medication.  His reflux symptoms have also significantly improved after losing weight.  He remains on omeprazole 20 mg once daily and questions if he can wean off this medication.  He has a history of IBS-D for which she takes Colestid 1 gram p.o. twice daily.  He underwent a colonoscopy 01/11/2017 which identified 2 3 mm hyperplastic polyps which were removed from the sigmoid and rectum. Diverticulosis to the sigmoid colon was present. Biopsies were negative for IBD or microscopic colitis. He was advised to repeat a colonoscopy in 10 years.  He denies having any complaints today.  He is scheduled to have labs done by his endocrinologist 01/2022.   Labs 09/27/2021: Glucose 150.  BUN 9.  Creatinine 0.72.  Sodium 142.  Potassium 4.2.  Total bili 0.5.  Alk phos 59.  AST 15.  ALT 10.  Hemoglobin A1c 6.3.  TSH 2.550.  Vitamin B12 162.  Current Outpatient Medications on File Prior to Visit  Medication Sig Dispense Refill   amoxicillin (AMOXIL) 500 MG capsule SMARTSIG:4 Capsule(s) By Mouth     aspirin EC 81 MG tablet Take 81 mg by mouth daily.     clobetasol cream (TEMOVATE) 0.05 % as needed.     Continuous Blood Gluc Sensor (DEXCOM G7 SENSOR) MISC      gabapentin (NEURONTIN) 300 MG capsule Take 300-600 mg by mouth See admin instructions. Take 300 mg in the  morning and 600 mg at night     Insulin Pen Needle (BD PEN NEEDLE NANO 2ND GEN) 32G X 4 MM MISC See admin instructions.     lidocaine (XYLOCAINE) 5 % ointment APPLY TO THE AFFECTED AREA 1-4 TIMES DAILY AS NEEDED     lisinopril (ZESTRIL) 2.5 MG tablet Take 2.5 mg by mouth daily.     metFORMIN (GLUCOPHAGE) 1000 MG tablet Take 1 tablet (1,000 mg total) by mouth 2 (two) times daily.  3   metoprolol succinate (TOPROL-XL) 50 MG 24 hr tablet TAKE ONE TABLET BY MOUTH DAILY. TAKE WITH OR IMMEDIATELY AFTER A MEAL 90 tablet 3   montelukast (SINGULAIR) 10 MG tablet Take 10 mg by mouth daily as needed (allergies).   2   MOUNJARO 10 MG/0.5ML Pen Inject 10 mg into the skin once a week.     ondansetron (ZOFRAN) 4 MG tablet Take 4 mg by mouth as needed.     pravastatin (PRAVACHOL) 80 MG tablet TAKE ONE TABLET BY MOUTH DAILY 90 tablet 3   traZODone (DESYREL) 150 MG tablet Take by mouth at bedtime.     TRESIBA FLEXTOUCH 100 UNIT/ML FlexTouch Pen Inject 25 Units into the skin daily.     amoxicillin-clavulanate (AUGMENTIN) 875-125 MG tablet Take 1 tablet by mouth 2 (two) times daily. (Patient not taking: Reported on 12/26/2021)     Carboxymethylcellul-Glycerin (LUBRICATING EYE DROPS OP) Place 1 drop into both eyes daily as needed (dry eyes).  JARDIANCE 25 MG TABS tablet Take 25 mg by mouth daily. (Patient not taking: Reported on 12/26/2021)     Lidocaine 5 % CREA Apply 1 application topically daily as needed (muscle pain). (Patient not taking: Reported on 12/26/2021)     Oxycodone HCl 10 MG TABS take 1 tablet by oral route  every 6-8 hours for severe pain (Patient not taking: Reported on 12/26/2021)     No current facility-administered medications on file prior to visit.   No Known Allergies  Current Medications, Allergies, Past Medical History, Past Surgical History, Family History and Social History were reviewed in [employer] record.  Review of Systems:   Constitutional: See HPI.   Respiratory: Negative for shortness of breath.   Cardiovascular: Negative for chest pain, palpitations and leg swelling.  Gastrointestinal: See HPI.  Musculoskeletal: Negative for back pain or muscle aches.  Neurological: Negative for dizziness, headaches or paresthesias.    Physical Exam: BP 122/68   Pulse 94   Ht 6' (1.829 m)   Wt 222 lb 12.8 oz (101.1 kg)   SpO2 97%   BMI 30.22 kg/m   General: 66 year old male in no acute distress. Head: Normocephalic and atraumatic. Eyes: No scleral icterus. Conjunctiva pink . Ears: Normal auditory acuity. Mouth: Dentition intact. No ulcers or lesions.  Lungs: Clear throughout to auscultation. Heart: Regular rate and rhythm, no murmur. Abdomen: Soft, nontender and nondistended. No masses or hepatomegaly. Normal bowel sounds x 4 quadrants.  Rectal: Deferred. Musculoskeletal: Symmetrical with no gross deformities. Extremities: No edema. Neurological: Alert oriented x 4. No focal deficits.  Psychological: Alert and cooperative. Normal mood and affect  Assessment and Recommendations:  68) 66 year-old male with diarrhea predominant IBS well controlled on Colestid 1 g once or twice daily as needed. -Refill Colestid 1 g p.o. twice daily as needed -Patient to call our office if his symptoms worsen   2) History of hyperplastic colon and rectal polyps. -Next colonoscopy due 01/2027   3) GERD symptoms are stable on Omeprazole 20 mg once daily -Omeprazole 20 mg every other day for 2 weeks then every third day for 2 weeks then off as tolerated.  Patient to restart if he develops reflux symptoms. -Follow-up in the office in 1 year and as needed  4) Vitamin B12 deficiency -B12 injections and follow-up B12 levels with his endocrinologist Dr. Chalmers Cater. Recommend checking a CBC at the time of next lab draw 01/2022  5) DM type II, successful weight loss on [facility]

## 2021-12-26 NOTE — Patient Instructions (Addendum)
Decrease Omeprazole '20mg'$  to one capsule by mouth every other day x 2 weeks. If you do not experience any reflux symptoms or upper abdominal pain then reduce Omeprazole '20mg'$  one capsule every 3rd day x 2 weeks then stop it.  Follow up in one year and as needed.  Thank you for trusting me with your gastrointestinal care!   Carl Best, CRNP

## 2021-12-26 NOTE — Progress Notes (Signed)
Agree with assessment / plan as outlined.  

## 2022-01-04 DIAGNOSIS — H4311 Vitreous hemorrhage, right eye: Secondary | ICD-10-CM | POA: Diagnosis not present

## 2022-01-04 DIAGNOSIS — E113513 Type 2 diabetes mellitus with proliferative diabetic retinopathy with macular edema, bilateral: Secondary | ICD-10-CM | POA: Diagnosis not present

## 2022-01-04 DIAGNOSIS — H3582 Retinal ischemia: Secondary | ICD-10-CM | POA: Diagnosis not present

## 2022-01-04 DIAGNOSIS — H43813 Vitreous degeneration, bilateral: Secondary | ICD-10-CM | POA: Diagnosis not present

## 2022-01-04 DIAGNOSIS — H35411 Lattice degeneration of retina, right eye: Secondary | ICD-10-CM | POA: Diagnosis not present

## 2022-01-04 DIAGNOSIS — H35033 Hypertensive retinopathy, bilateral: Secondary | ICD-10-CM | POA: Diagnosis not present

## 2022-01-04 DIAGNOSIS — H35372 Puckering of macula, left eye: Secondary | ICD-10-CM | POA: Diagnosis not present

## 2022-01-09 DIAGNOSIS — E1165 Type 2 diabetes mellitus with hyperglycemia: Secondary | ICD-10-CM | POA: Diagnosis not present

## 2022-01-24 DIAGNOSIS — E538 Deficiency of other specified B group vitamins: Secondary | ICD-10-CM | POA: Diagnosis not present

## 2022-01-31 ENCOUNTER — Ambulatory Visit: Payer: Medicare PPO | Admitting: Podiatry

## 2022-01-31 VITALS — BP 143/78

## 2022-01-31 DIAGNOSIS — B351 Tinea unguium: Secondary | ICD-10-CM

## 2022-01-31 DIAGNOSIS — E1142 Type 2 diabetes mellitus with diabetic polyneuropathy: Secondary | ICD-10-CM | POA: Diagnosis not present

## 2022-01-31 DIAGNOSIS — Q828 Other specified congenital malformations of skin: Secondary | ICD-10-CM

## 2022-01-31 NOTE — Progress Notes (Signed)
  Subjective:  Patient ID: David Mcdowell, male    DOB: 1955/12/13,  MRN: [id number]  David Mcdowell presents to clinic today for at risk foot care with history of diabetic neuropathy and painful porokeratotic lesion(s) left foot and painful mycotic toenails that limit ambulation. Painful toenails interfere with ambulation. Aggravating factors include wearing enclosed shoe gear. Pain is relieved with periodic professional debridement. Painful porokeratotic lesions are aggravated when weightbearing with and without shoegear. Pain is relieved with periodic professional debridement.  Chief Complaint  Patient presents with   Nail Problem    [facility] BS-133 A1C-6.5 PCP-Samantha Jackson PCP VST-Do not remember    New problem(s): None.   PCP is Jake Samples, PA-C.  No Known Allergies  Review of Systems: Negative except as noted in the HPI.  Objective: No changes noted in today's physical examination. Vitals:   01/31/22 0914  BP: (!) 143/78   David Mcdowell is a pleasant 67 y.o. male obese in NAD. AAO x 3.  Vascular Examination: CFT <3 seconds b/l LE. Palpable DP pulse(s) b/l LE. Palpable PT pulse(s) b/l LE. Pedal hair absent. No pain with calf compression b/l. Trace edema noted BLE. No cyanosis or clubbing noted b/l LE.  Dermatological Examination: Pedal integument with normal turgor, texture and tone BLE. No open wounds b/l LE. No interdigital macerations noted b/l LE.   Spoon shaped nails 1.5 b/l with centrally depressed nailplate and elevated periphery of mycotic nailplates. There is tenderness to palpation.   Well healed longitudinal surgical scar from removal of plantar fibroma of left foot. He has a small area of porokeratosis along the course of the scar. No erythema, no edema, no drainage, no fluctuance.   Neurological Examination: Protective sensation intact 5/5 intact bilaterally with 10g monofilament b/l. Vibratory sensation intact b/l.  Musculoskeletal  Examination: Muscle strength 5/5 to all lower extremity muscle groups bilaterally.  Assessment/Plan: 1. Onychomycosis   2. Porokeratosis   3. Diabetic peripheral neuropathy associated with type 2 diabetes mellitus (David Mcdowell)     -Patient was evaluated and treated. All patient's and/or POA's questions/concerns answered on today's visit. -Recommended Urea 40% Cream to toenails with q-tip applicator three times weekly. -Continue foot and shoe inspections daily. Monitor blood glucose per PCP/Endocrinologist's recommendations. -Continue supportive shoe gear daily. -Toenails 1-5 b/l were debrided in length and girth with sterile nail nippers and dremel without iatrogenic bleeding.  -Porokeratotic lesion(s) plantar arch left foot pared and enucleated with sterile currette without incident. Total number of lesions debrided=1. -Patient/POA to call should there be question/concern in the interim.   Return in about 9 weeks (around 04/04/2022).  Marzetta Board, DPM

## 2022-01-31 NOTE — Patient Instructions (Signed)
Apply Urea 40% cream to nails three times weekly. Do not apply to normal skin.

## 2022-02-02 DIAGNOSIS — H35033 Hypertensive retinopathy, bilateral: Secondary | ICD-10-CM | POA: Diagnosis not present

## 2022-02-02 DIAGNOSIS — E113593 Type 2 diabetes mellitus with proliferative diabetic retinopathy without macular edema, bilateral: Secondary | ICD-10-CM | POA: Diagnosis not present

## 2022-02-02 DIAGNOSIS — H3582 Retinal ischemia: Secondary | ICD-10-CM | POA: Diagnosis not present

## 2022-02-02 DIAGNOSIS — H4312 Vitreous hemorrhage, left eye: Secondary | ICD-10-CM | POA: Diagnosis not present

## 2022-02-03 ENCOUNTER — Encounter: Payer: Self-pay | Admitting: Podiatry

## 2022-03-19 DIAGNOSIS — H26493 Other secondary cataract, bilateral: Secondary | ICD-10-CM | POA: Diagnosis not present

## 2022-03-20 DIAGNOSIS — Z9689 Presence of other specified functional implants: Secondary | ICD-10-CM | POA: Diagnosis not present

## 2022-03-20 DIAGNOSIS — M961 Postlaminectomy syndrome, not elsewhere classified: Secondary | ICD-10-CM | POA: Diagnosis not present

## 2022-03-20 DIAGNOSIS — M5416 Radiculopathy, lumbar region: Secondary | ICD-10-CM | POA: Diagnosis not present

## 2022-03-20 DIAGNOSIS — Z6830 Body mass index (BMI) 30.0-30.9, adult: Secondary | ICD-10-CM | POA: Diagnosis not present

## 2022-04-03 DIAGNOSIS — G609 Hereditary and idiopathic neuropathy, unspecified: Secondary | ICD-10-CM | POA: Diagnosis not present

## 2022-04-03 DIAGNOSIS — E7800 Pure hypercholesterolemia, unspecified: Secondary | ICD-10-CM | POA: Diagnosis not present

## 2022-04-03 DIAGNOSIS — I10 Essential (primary) hypertension: Secondary | ICD-10-CM | POA: Diagnosis not present

## 2022-04-03 DIAGNOSIS — E11319 Type 2 diabetes mellitus with unspecified diabetic retinopathy without macular edema: Secondary | ICD-10-CM | POA: Diagnosis not present

## 2022-04-03 DIAGNOSIS — E1165 Type 2 diabetes mellitus with hyperglycemia: Secondary | ICD-10-CM | POA: Diagnosis not present

## 2022-04-03 DIAGNOSIS — F419 Anxiety disorder, unspecified: Secondary | ICD-10-CM | POA: Diagnosis not present

## 2022-04-03 DIAGNOSIS — E538 Deficiency of other specified B group vitamins: Secondary | ICD-10-CM | POA: Diagnosis not present

## 2022-04-04 ENCOUNTER — Ambulatory Visit: Payer: Medicare PPO | Admitting: Podiatrist

## 2022-04-04 DIAGNOSIS — L603 Nail dystrophy: Secondary | ICD-10-CM

## 2022-04-04 DIAGNOSIS — M722 Plantar fascial fibromatosis: Secondary | ICD-10-CM

## 2022-04-04 DIAGNOSIS — E1142 Type 2 diabetes mellitus with diabetic polyneuropathy: Secondary | ICD-10-CM

## 2022-04-04 NOTE — Progress Notes (Signed)
  Subjective:  Patient ID: David Mcdowell, male    DOB: December 05, 1955,  MRN: [id number]  David Mcdowell presents to clinic today for at risk foot care.  He has a painful plantar fibroma which he has previously had removed. He relates it is giving him discomfort and he receives injections from time to time which seem to help.  He would like an injection today if possible.    Chief Complaint  Patient presents with   Nail Problem    [facility] BS-150 A1C-6.2 Delaine Lame PCP VST-Last year    New problem(s): None.   PCP is Jake Samples, PA-C.  No Known Allergies  Review of Systems: Negative except as noted in the HPI.  Objective: No changes noted in today's physical examination. There were no vitals filed for this visit.  David Mcdowell is a pleasant 67 y.o. male obese in NAD. AAO x 3.  Vascular Examination: CFT <3 seconds b/l LE. Palpable DP pulse(s) b/l LE. Palpable PT pulse(s) b/l LE. Pedal hair absent. No pain with calf compression b/l. Trace edema noted BLE. No cyanosis or clubbing noted b/l LE.  Dermatological Examination: Pedal integument with normal turgor, texture and tone BLE. No open wounds b/l LE. No interdigital macerations noted b/l LE.   Spoon shaped nails 1.5 b/l with centrally depressed nailplate and elevated periphery of mycotic nailplates. There is tenderness to palpation.   Well healed longitudinal surgical scar from removal of plantar fibroma of left foot. Pain on palpation along the palpable fibroma noted.  Neurological Examination: Protective sensation intact 5/5 intact bilaterally with 10g monofilament b/l. Vibratory sensation intact b/l.  Musculoskeletal Examination: Muscle strength 5/5 to all lower extremity muscle groups bilaterally.  Assessment/Plan:   ICD-10-CM   1. Diabetic peripheral neuropathy associated with type 2 diabetes mellitus (HCC)  E11.42     2. 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  Onychoreduction of symptomatic toenails was performed via nail nipper and power burr without iatrogenic incident.  Patient was instructed on signs and symptoms of infection and was told to call immediately should any of these arise.  Recommended follow up in 3 months or instructed to call sooner if any pedal concerns arise.  Injection of plantar fibroma carried out today with [id number] and marcaine plain without complication. He will be seen back for recheck with Dr. Posey Pronto in 2-3 months.    Procedure: Injection  Discussed alternatives, risks, complications and verbal consent was obtained.  Location: plantar fibroma left foot Skin Prep: Alcohol. Injectate: 1cc 0.5% marcaine plain, 1 cc 10 mg Kenalog Disposition: Patient tolerated procedure well. Injection site dressed with a band-aid.  Post-injection care was discussed and return precautions discussed.

## 2022-04-09 DIAGNOSIS — E1165 Type 2 diabetes mellitus with hyperglycemia: Secondary | ICD-10-CM | POA: Diagnosis not present

## 2022-04-26 DIAGNOSIS — H3582 Retinal ischemia: Secondary | ICD-10-CM | POA: Diagnosis not present

## 2022-04-26 DIAGNOSIS — H35033 Hypertensive retinopathy, bilateral: Secondary | ICD-10-CM | POA: Diagnosis not present

## 2022-04-26 DIAGNOSIS — E113511 Type 2 diabetes mellitus with proliferative diabetic retinopathy with macular edema, right eye: Secondary | ICD-10-CM | POA: Diagnosis not present

## 2022-04-26 DIAGNOSIS — E113592 Type 2 diabetes mellitus with proliferative diabetic retinopathy without macular edema, left eye: Secondary | ICD-10-CM | POA: Diagnosis not present

## 2022-04-26 DIAGNOSIS — H43813 Vitreous degeneration, bilateral: Secondary | ICD-10-CM | POA: Diagnosis not present

## 2022-04-26 DIAGNOSIS — H4312 Vitreous hemorrhage, left eye: Secondary | ICD-10-CM | POA: Diagnosis not present

## 2022-05-18 ENCOUNTER — Ambulatory Visit: Payer: Medicare PPO | Admitting: Podiatry

## 2022-05-18 DIAGNOSIS — Z01818 Encounter for other preprocedural examination: Secondary | ICD-10-CM

## 2022-05-18 DIAGNOSIS — D2372 Other benign neoplasm of skin of left lower limb, including hip: Secondary | ICD-10-CM | POA: Diagnosis not present

## 2022-05-18 DIAGNOSIS — M79672 Pain in left foot: Secondary | ICD-10-CM | POA: Diagnosis not present

## 2022-05-18 DIAGNOSIS — L989 Disorder of the skin and subcutaneous tissue, unspecified: Secondary | ICD-10-CM | POA: Diagnosis not present

## 2022-05-18 NOTE — Progress Notes (Signed)
Subjective:  Patient ID: David Mcdowell, male    DOB: [birth year],  MRN: [id number]  Chief Complaint  Patient presents with   Foot Pain    Patient reports left foot pain that has not improved. Stated his foot has felt the same even after surgery and it is progressively getting worse.    67 y.o. male presents with the above complaint.  Patient presents with left midfoot benign skin lesion.  Patient states that is happening right at the previous surgical incision to remove plantar fibroma.  Patient states painful to touch she would like to have it removed he is tried offloading padding protecting Shavini done none of which has helped.  He denies any other acute complaints.   Review of Systems: Negative except as noted in the HPI. Denies N/V/F/Ch.  Past Medical History:  Diagnosis Date   Abscess    Anxiety    Arthritis    Chronic back pain    spinal implant   Coronary atherosclerosis    Mild at cardiac catheterization December 2020   Essential hypertension    GERD (gastroesophageal reflux disease)    History of kidney stones    Mixed hyperlipidemia    Myositis    Neuropathy    Recurrent kidney stones    Type 1 diabetes mellitus (HCC)    Insulin pump    Current Outpatient Medications:    amoxicillin (AMOXIL) 500 MG capsule, SMARTSIG:4 Capsule(s) By Mouth, Disp: , Rfl:    amoxicillin-clavulanate (AUGMENTIN) 875-125 MG tablet, Take 1 tablet by mouth 2 (two) times daily. (Patient not taking: Reported on 12/26/2021), Disp: , Rfl:    aspirin EC 81 MG tablet, Take 81 mg by mouth daily., Disp: , Rfl:    Carboxymethylcellul-Glycerin (LUBRICATING EYE DROPS OP), Place 1 drop into both eyes daily as needed (dry eyes)., Disp: , Rfl:    clobetasol cream (TEMOVATE) 0.05 %, as needed., Disp: , Rfl:    colestipol (COLESTID) 1 g tablet, TAKE ONE TABLET BY MOUTH TWICE DAILY., Disp: 180 tablet, Rfl: 3   Continuous Blood Gluc Sensor (DEXCOM G7 SENSOR) MISC, , Disp: , Rfl:    gabapentin (NEURONTIN)  300 MG capsule, Take 300-600 mg by mouth See admin instructions. Take 300 mg in the morning and 600 mg at night, Disp: , Rfl:    Insulin Pen Needle (BD PEN NEEDLE NANO 2ND GEN) 32G X 4 MM MISC, See admin instructions., Disp: , Rfl:    JARDIANCE 25 MG TABS tablet, Take 25 mg by mouth daily. (Patient not taking: Reported on 12/26/2021), Disp: , Rfl:    lidocaine (XYLOCAINE) 5 % ointment, APPLY TO THE AFFECTED AREA 1-4 TIMES DAILY AS NEEDED, Disp: , Rfl:    Lidocaine 5 % CREA, Apply 1 application topically daily as needed (muscle pain). (Patient not taking: Reported on 12/26/2021), Disp: , Rfl:    lisinopril (ZESTRIL) 2.5 MG tablet, Take 2.5 mg by mouth daily., Disp: , Rfl:    metFORMIN (GLUCOPHAGE) 1000 MG tablet, Take 1 tablet (1,000 mg total) by mouth 2 (two) times daily., Disp: , Rfl: 3   metoprolol succinate (TOPROL-XL) 50 MG 24 hr tablet, TAKE ONE TABLET BY MOUTH DAILY. TAKE WITH OR IMMEDIATELY AFTER A MEAL, Disp: 90 tablet, Rfl: 3   montelukast (SINGULAIR) 10 MG tablet, Take 10 mg by mouth daily as needed (allergies). , Disp: , Rfl: 2   MOUNJARO 10 MG/0.5ML Pen, Inject 10 mg into the skin once a week., Disp: , Rfl:    omeprazole (PRILOSEC) 20  MG capsule, Take 1 capsule (20 mg total) by mouth daily., Disp: 90 capsule, Rfl: 3   ondansetron (ZOFRAN) 4 MG tablet, Take 4 mg by mouth as needed., Disp: , Rfl:    Oxycodone HCl 10 MG TABS, take 1 tablet by oral route  every 6-8 hours for severe pain (Patient not taking: Reported on 12/26/2021), Disp: , Rfl:    pravastatin (PRAVACHOL) 80 MG tablet, TAKE ONE TABLET BY MOUTH DAILY, Disp: 90 tablet, Rfl: 3   traZODone (DESYREL) 150 MG tablet, Take by mouth at bedtime., Disp: , Rfl:    TRESIBA FLEXTOUCH 100 UNIT/ML FlexTouch Pen, Inject 25 Units into the skin daily., Disp: , Rfl:   Social History   Tobacco Use  Smoking Status Never  Smokeless Tobacco Never    No Known Allergies Objective:  There were no vitals filed for this visit. There is no  height or weight on file to calculate BMI. Constitutional Well developed. Well nourished.  Vascular Dorsalis pedis pulses palpable bilaterally. Posterior tibial pulses palpable bilaterally. Capillary refill normal to all digits.  No cyanosis or clubbing noted. Pedal hair growth normal.  Neurologic Normal speech. Oriented to person, place, and time. Epicritic sensation to light touch grossly present bilaterally.  Dermatologic Hyperkeratotic lesion with central nucleated core noted to left plantar midfoot at previous surgical scarring.  Pain on palpation to the lesion.  Orthopedic: Normal joint ROM without pain or crepitus bilaterally. No visible deformities. No bony tenderness.   Radiographs: None Assessment:   1. Benign skin lesion   2. Encounter for preoperative examination for general surgical procedure    Plan:  Patient was evaluated and treated and all questions answered.  Left Planter medial benign skin lesion -All questions and concerns were discussed with the patient extensive detail given the amount of pain that is having he will benefit from surgical excision of the skin lesion with a possible resection of the plantar fascia/plantar fasciectomy.  Clinically unable to appreciate the recurrence of plantar fibroma.  We will primarily only focus on the skin lesion where the previous incision was made -I discussed my preoperative intra postop plan in extensive detail he states understanding like to proceed with surgery -Informed surgical risk consent was reviewed and read aloud to the patient.  I reviewed the films.  I have discussed my findings with the patient in great detail.  I have discussed all risks including but not limited to infection, stiffness, scarring, limp, disability, deformity, damage to blood vessels and nerves, numbness, poor healing, need for braces, arthritis, chronic pain, amputation, death.  All benefits and realistic expectations discussed in great detail.  I  have made no promises as to the outcome.  I have provided realistic expectations.  I have offered the patient a 2nd opinion, which they have declined and assured me they preferred to proceed despite the risks

## 2022-06-12 ENCOUNTER — Telehealth: Payer: Self-pay | Admitting: Urology

## 2022-06-12 NOTE — Telephone Encounter (Signed)
DOS - 07/09/22  The following codes do not require a pre-authorization All services are subject to members benefits, exclusions, limitations and other applicable conditions. Created on 06/12/2022  [employer] Membership type Medicare Plan Year 01/09/2019 - 01/07/9998   Service info 16109 Fasciectomy, plantar fascia; radical (separate procedure

## 2022-06-13 ENCOUNTER — Ambulatory Visit: Payer: Medicare PPO | Admitting: Podiatry

## 2022-06-13 VITALS — BP 132/79

## 2022-06-13 DIAGNOSIS — Q828 Other specified congenital malformations of skin: Secondary | ICD-10-CM | POA: Diagnosis not present

## 2022-06-13 DIAGNOSIS — E119 Type 2 diabetes mellitus without complications: Secondary | ICD-10-CM

## 2022-06-13 DIAGNOSIS — E1142 Type 2 diabetes mellitus with diabetic polyneuropathy: Secondary | ICD-10-CM

## 2022-06-13 DIAGNOSIS — L603 Nail dystrophy: Secondary | ICD-10-CM

## 2022-06-13 NOTE — Progress Notes (Signed)
ANNUAL DIABETIC FOOT EXAM  Subjective: David Mcdowell presents today annual diabetic foot exam.  Chief Complaint  Patient presents with   Nail Problem    DFC,Jackson, [name], PA-C,BS ;184,A1C:8.6       Patient confirms h/o diabetes.  Patient denies any h/o foot wounds.  Patient has been diagnosed with neuropathy.  Risk factors: diabetes, diabetic neuropathy, HTN, hyperlipidemia.  Avis Epley, PA-C is patient's PCP.  Past Medical History:  Diagnosis Date   Abscess    Anxiety    Arthritis    Chronic back pain    spinal implant   Coronary atherosclerosis    Mild at cardiac catheterization December 2020   Essential hypertension    GERD (gastroesophageal reflux disease)    History of kidney stones    Mixed hyperlipidemia    Myositis    Neuropathy    Recurrent kidney stones    Type 1 diabetes mellitus (HCC)    Insulin pump   Patient Active Problem List   Diagnosis Date Noted   Anxiety disorder 08/19/2020   Diabetic retinopathy (HCC) 08/19/2020   Hereditary and idiopathic neuropathy, unspecified 08/19/2020   Hyperglycemia due to type 2 diabetes mellitus (HCC) 08/19/2020   Presence of insulin pump (external) (internal) 08/19/2020   Spinal cord stimulator status 06/15/2019   Lumbar radicular pain 05/12/2019   Lumbar post-laminectomy syndrome 05/07/2019   Trochanteric bursitis of right hip 02/25/2019   Body mass index (BMI) 38.0-38.9, adult 01/13/2019   Progressive angina (HCC)    Chronic low back pain 05/29/2017   S/P lumbar spinal fusion 07/25/2016   DJD (degenerative joint disease) of knee 01/12/2015   Groin abscess 02/06/2014   Abscess of groin, right 02/06/2014   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   Cyst of buttocks 11/25/2012   Mixed hyperlipidemia 07/08/2012   Dyspepsia 07/08/2012   Precordial pain 05/04/2010   Type 2 diabetes mellitus, uncontrolled, with neuropathy 05/04/2010   Essential hypertension, benign 05/04/2010   Past Surgical History:   Procedure Laterality Date   ABDOMINAL EXPOSURE N/A 07/25/2016   Procedure: ABDOMINAL EXPOSURE;  Surgeon: Larina Earthly, MD;  Location: [facility] OR;  Service: Vascular;  Laterality: N/A;   ANTERIOR LUMBAR FUSION N/A 07/25/2016   Procedure: LUMBAR FOUR-FIVE ANTERIOR LUMBAR INTERBODY FUSION;  Surgeon: Tia Alert, MD;  Location: [facility] OR;  Service: Neurosurgery;  Laterality: N/A;   BACK SURGERY     CARPAL TUNNEL RELEASE     DORSAL COMPARTMENT RELEASE Left 12/22/2015   Procedure: RELEASE DORSAL COMPARTMENT (DEQUERVAIN), left wrist;  Surgeon: Loreta Ave, MD;  Location: [facility];  Service: Orthopedics;  Laterality: Left;  block   ELBOW SURGERY     both tendon repairs( both elbows)   INCISION AND DRAINAGE ABSCESS Right 02/06/2014   Procedure: INCISION AND DRAINAGE ABSCESS right groin abcess debridement skin and subqutaneous tissue;  Surgeon: Glenna Fellows, MD;  Location: [facility] ORS;  Service: General;  Laterality: Right;   LEFT HEART CATH AND CORONARY ANGIOGRAPHY N/A 12/16/2018   Procedure: LEFT HEART CATH AND CORONARY ANGIOGRAPHY;  Surgeon: Corky Crafts, MD;  Location: [facility] INVASIVE CV LAB;  Service: Cardiovascular;  Laterality: N/A;   STERIOD INJECTION Right 12/22/2015   Procedure: STEROID INJECTION;  Surgeon: Loreta Ave, MD;  Location: [facility];  Service: Orthopedics;  Laterality: Right;   TOTAL KNEE ARTHROPLASTY Left 01/12/2015   Procedure: LEFT TOTAL KNEE ARTHROPLASTY;  Surgeon: Loreta Ave, MD;  Location: [facility] OR;  Service: Orthopedics;  Laterality: Left;  Current Outpatient Medications on File Prior to Visit  Medication Sig Dispense Refill   amoxicillin (AMOXIL) 500 MG capsule SMARTSIG:4 Capsule(s) By Mouth     amoxicillin-clavulanate (AUGMENTIN) 875-125 MG tablet Take 1 tablet by mouth 2 (two) times daily.     aspirin EC 81 MG tablet Take 81 mg by mouth daily.     clobetasol cream (TEMOVATE) 0.05 % as needed.     Continuous Blood Gluc Sensor  (DEXCOM G7 SENSOR) MISC      Insulin Pen Needle (BD PEN NEEDLE NANO 2ND GEN) 32G X 4 MM MISC See admin instructions.     lidocaine (XYLOCAINE) 5 % ointment APPLY TO THE AFFECTED AREA 1-4 TIMES DAILY AS NEEDED     lisinopril (ZESTRIL) 2.5 MG tablet Take 2.5 mg by mouth daily.     metFORMIN (GLUCOPHAGE) 500 MG tablet Take 500 mg by mouth 2 (two) times daily with a meal.     metoprolol succinate (TOPROL-XL) 25 MG 24 hr tablet Take 25 mg by mouth daily.     montelukast (SINGULAIR) 10 MG tablet Take 10 mg by mouth daily as needed (allergies).   2   MOUNJARO 10 MG/0.5ML Pen Inject 10 mg into the skin once a week.     ondansetron (ZOFRAN) 4 MG tablet Take 4 mg by mouth as needed.     pravastatin (PRAVACHOL) 80 MG tablet TAKE ONE TABLET BY MOUTH DAILY 90 tablet 3   pregabalin (LYRICA) 100 MG capsule Take 100 mg by mouth 2 (two) times daily.     TRESIBA FLEXTOUCH 100 UNIT/ML FlexTouch Pen Inject 25 Units into the skin daily.     Carboxymethylcellul-Glycerin (LUBRICATING EYE DROPS OP) Place 1 drop into both eyes daily as needed (dry eyes).     Lidocaine 5 % CREA Apply 1 application topically daily as needed (muscle pain). (Patient not taking: Reported on 12/26/2021)     omeprazole (PRILOSEC) 20 MG capsule Take 1 capsule (20 mg total) by mouth daily. 90 capsule 3   Oxycodone HCl 10 MG TABS take 1 tablet by oral route  every 6-8 hours for severe pain (Patient not taking: Reported on 12/26/2021)     No current facility-administered medications on file prior to visit.    No Known Allergies Social History   Occupational History   Occupation: retired  Tobacco Use   Smoking status: Never   Smokeless tobacco: Never  Vaping Use   Vaping Use: Never used  Substance and Sexual Activity   Alcohol use: No    Alcohol/week: 0.0 standard drinks of alcohol   Drug use: No   Sexual activity: Not on file   Family History  Problem Relation Age of Onset   Asthma Mother    Heart disease Father    Diabetes  Father    Asthma Sister    Healthy Brother    Coronary artery disease Other    Leukemia Paternal Grandfather    Cirrhosis Maternal Aunt    Colon cancer Neg Hx    Rectal cancer Neg Hx    Esophageal cancer Neg Hx    Stomach cancer Neg Hx    Liver cancer Neg Hx    Immunization History  Administered Date(s) Administered   Influenza Inj Mdck Quad With Preservative 10/20/2018   Influenza-Unspecified 09/08/2017   Moderna Sars-Covid-2 Vaccination 03/10/2019, 04/07/2019, 01/18/2020     Review of Systems: Negative except as noted in the HPI.   Objective: Vitals:   06/13/22 0832  BP: 132/79    Molly Maduro  K Camargo is a pleasant 68 y.o. male in NAD. AAO X 3.  Vascular Examination: Capillary refill time immediate b/l. Vascular status intact b/l with palpable pedal pulses. Pedal hair absent b/l. No pain with calf compression b/l. Skin temperature gradient WNL b/l. No cyanosis or clubbing b/l. No ischemia or gangrene noted b/l. Trace edema noted BLE.  Neurological Examination: Pt has subjective symptoms of neuropathy. Sensation grossly intact b/l with 10 gram monofilament. Vibratory sensation intact b/l.   Dermatological Examination: Pedal skin with normal turgor, texture and tone b/l.  No open wounds. No interdigital macerations.   Spoon shaped nails 1.5 b/l with centrally depressed nailplate and elevated periphery of mycotic nailplates. There is tenderness to palpation.   Porokeratotic longitudinal scar midfoot with tenderness to palpation.  Musculoskeletal Examination: Muscle strength 5/5 to all lower extremity muscle groups bilaterally.  Radiographs: None  Lab Results  Component Value Date   HGBA1C 8.6 (H) 12/31/2014   ADA Risk Categorization: Low Risk :  Patient has all of the following: Intact protective sensation No prior foot ulcer  No severe deformity Pedal pulses present  Assessment: 1. Nail dystrophy   2. Porokeratosis   3. Diabetic peripheral neuropathy  associated with type 2 diabetes mellitus (HCC)   4. Encounter for diabetic foot exam (HCC)     Plan: -Consent given for treatment as described below: -Examined patient. -Patient to continue soft, supportive shoe gear daily. -Toenails 1-5 bilaterally debrided in length and girth without iatrogenic bleeding with sterile nail nipper and dremel.  -Porokeratotic lesion(s) left foot pared and enucleated with sterile currette without incident. Total number of lesions debrided=1. -Patient/POA to call should there be question/concern in the interim. Return in about 9 weeks (around 08/15/2022).  Freddie Breech, DPM

## 2022-06-18 ENCOUNTER — Encounter: Payer: Self-pay | Admitting: Podiatry

## 2022-06-18 DIAGNOSIS — Z9689 Presence of other specified functional implants: Secondary | ICD-10-CM | POA: Diagnosis not present

## 2022-06-18 DIAGNOSIS — M961 Postlaminectomy syndrome, not elsewhere classified: Secondary | ICD-10-CM | POA: Diagnosis not present

## 2022-06-18 DIAGNOSIS — Z6831 Body mass index (BMI) 31.0-31.9, adult: Secondary | ICD-10-CM | POA: Diagnosis not present

## 2022-07-08 DIAGNOSIS — E1165 Type 2 diabetes mellitus with hyperglycemia: Secondary | ICD-10-CM | POA: Diagnosis not present

## 2022-07-09 ENCOUNTER — Encounter: Payer: Self-pay | Admitting: Podiatry

## 2022-07-09 ENCOUNTER — Other Ambulatory Visit: Payer: Self-pay | Admitting: Podiatry

## 2022-07-09 DIAGNOSIS — D492 Neoplasm of unspecified behavior of bone, soft tissue, and skin: Secondary | ICD-10-CM | POA: Diagnosis not present

## 2022-07-09 DIAGNOSIS — M722 Plantar fascial fibromatosis: Secondary | ICD-10-CM | POA: Diagnosis not present

## 2022-07-09 DIAGNOSIS — L905 Scar conditions and fibrosis of skin: Secondary | ICD-10-CM | POA: Diagnosis not present

## 2022-07-09 MED ORDER — IBUPROFEN 800 MG PO TABS: 800.0000 mg | ORAL_TABLET | Freq: Four times a day (QID) | ORAL | 1 refills | Status: DC | PRN

## 2022-07-09 MED ORDER — OXYCODONE-ACETAMINOPHEN 5-325 MG PO TABS: 1.0000 | ORAL_TABLET | ORAL | 0 refills | Status: DC | PRN

## 2022-07-18 ENCOUNTER — Ambulatory Visit (INDEPENDENT_AMBULATORY_CARE_PROVIDER_SITE_OTHER): Payer: Medicare PPO | Admitting: Podiatry

## 2022-07-18 DIAGNOSIS — Z9889 Other specified postprocedural states: Secondary | ICD-10-CM

## 2022-07-18 NOTE — Progress Notes (Signed)
Subjective:  Patient ID: David Mcdowell, male    DOB: [birth year],  MRN: [id number]  Chief Complaint  Patient presents with   Routine Post Op    POV #1 DOS 07/09/2022 LT EXCISION OF BENIGN SKIN LESION W/EXCISION OF PLANTAR FIBROMA    DOS: 07/09/2022 Procedure: Left excision of plantar fibroma  67 y.o. male returns for post-op check.  Patient states that he is doing well denies any other acute complaints.  Bandages clean dry and intact patient is ambulating with surgical shoe  Review of Systems: Negative except as noted in the HPI. Denies N/V/F/Ch.  Past Medical History:  Diagnosis Date   Abscess    Anxiety    Arthritis    Chronic back pain    spinal implant   Coronary atherosclerosis    Mild at cardiac catheterization December 2020   Essential hypertension    GERD (gastroesophageal reflux disease)    History of kidney stones    Mixed hyperlipidemia    Myositis    Neuropathy    Recurrent kidney stones    Type 1 diabetes mellitus (HCC)    Insulin pump    Current Outpatient Medications:    amoxicillin (AMOXIL) 500 MG capsule, SMARTSIG:4 Capsule(s) By Mouth, Disp: , Rfl:    amoxicillin-clavulanate (AUGMENTIN) 875-125 MG tablet, Take 1 tablet by mouth 2 (two) times daily., Disp: , Rfl:    aspirin EC 81 MG tablet, Take 81 mg by mouth daily., Disp: , Rfl:    Carboxymethylcellul-Glycerin (LUBRICATING EYE DROPS OP), Place 1 drop into both eyes daily as needed (dry eyes)., Disp: , Rfl:    clobetasol cream (TEMOVATE) 0.05 %, as needed., Disp: , Rfl:    Continuous Blood Gluc Sensor (DEXCOM G7 SENSOR) MISC, , Disp: , Rfl:    ibuprofen (ADVIL) 800 MG tablet, Take 1 tablet (800 mg total) by mouth every 6 (six) hours as needed., Disp: 60 tablet, Rfl: 1   Insulin Pen Needle (BD PEN NEEDLE NANO 2ND GEN) 32G X 4 MM MISC, See admin instructions., Disp: , Rfl:    lidocaine (XYLOCAINE) 5 % ointment, APPLY TO THE AFFECTED AREA 1-4 TIMES DAILY AS NEEDED, Disp: , Rfl:    Lidocaine 5 % CREA, Apply 1  application topically daily as needed (muscle pain). (Patient not taking: Reported on 12/26/2021), Disp: , Rfl:    lisinopril (ZESTRIL) 2.5 MG tablet, Take 2.5 mg by mouth daily., Disp: , Rfl:    metFORMIN (GLUCOPHAGE) 500 MG tablet, Take 500 mg by mouth 2 (two) times daily with a meal., Disp: , Rfl:    metoprolol succinate (TOPROL-XL) 25 MG 24 hr tablet, Take 25 mg by mouth daily., Disp: , Rfl:    montelukast (SINGULAIR) 10 MG tablet, Take 10 mg by mouth daily as needed (allergies). , Disp: , Rfl: 2   MOUNJARO 10 MG/0.5ML Pen, Inject 10 mg into the skin once a week., Disp: , Rfl:    omeprazole (PRILOSEC) 20 MG capsule, Take 1 capsule (20 mg total) by mouth daily., Disp: 90 capsule, Rfl: 3   ondansetron (ZOFRAN) 4 MG tablet, Take 4 mg by mouth as needed., Disp: , Rfl:    Oxycodone HCl 10 MG TABS, take 1 tablet by oral route  every 6-8 hours for severe pain (Patient not taking: Reported on 12/26/2021), Disp: , Rfl:    oxyCODONE-acetaminophen (PERCOCET) 5-325 MG tablet, Take 1 tablet by mouth every 4 (four) hours as needed for severe pain., Disp: 30 tablet, Rfl: 0   pravastatin (PRAVACHOL) 80 MG  tablet, TAKE ONE TABLET BY MOUTH DAILY, Disp: 90 tablet, Rfl: 3   pregabalin (LYRICA) 100 MG capsule, Take 100 mg by mouth 2 (two) times daily., Disp: , Rfl:    TRESIBA FLEXTOUCH 100 UNIT/ML FlexTouch Pen, Inject 25 Units into the skin daily., Disp: , Rfl:   Social History   Tobacco Use  Smoking Status Never  Smokeless Tobacco Never    No Known Allergies Objective:  There were no vitals filed for this visit. There is no height or weight on file to calculate BMI. Constitutional Well developed. Well nourished.  Vascular Foot warm and well perfused. Capillary refill normal to all digits.   Neurologic Normal speech. Oriented to person, place, and time. Epicritic sensation to light touch grossly present bilaterally.  Dermatologic Skin healing well without signs of infection. Skin edges well coapted  without signs of infection.  Orthopedic: Tenderness to palpation noted about the surgical site.   Radiographs: None Assessment:   1. S/P foot surgery    Plan:  Patient was evaluated and treated and all questions answered.  S/p foot surgery left -Progressing as expected post-operatively. -XR: See above -WB Status: Weightbearing as tolerated in surgical shoe -Sutures: Intact.  No clinical signs of Deis is noted no complication noted. -Medications: None. -Foot redressed.  No follow-ups on file.

## 2022-08-01 ENCOUNTER — Ambulatory Visit (INDEPENDENT_AMBULATORY_CARE_PROVIDER_SITE_OTHER): Payer: Medicare PPO | Admitting: Podiatry

## 2022-08-01 DIAGNOSIS — Z9889 Other specified postprocedural states: Secondary | ICD-10-CM

## 2022-08-01 NOTE — Progress Notes (Signed)
Subjective:  Patient ID: David Mcdowell, male    DOB: Nov 23, 1955,  MRN: [id number]  Chief Complaint  Patient presents with   Routine Post Op    POV #2 DOS 07/09/2022 LT EXCISION OF BENIGN SKIN LESION W/EXCISION OF PLANTAR FIBROMA    DOS: 07/09/2022 Procedure: Left excision of plantar fibroma  67 y.o. male returns for post-op check.  Patient states that he is doing well denies any other acute complaints.  Bandages clean dry and intact patient is ambulating with surgical shoe  Review of Systems: Negative except as noted in the HPI. Denies N/V/F/Ch.  Past Medical History:  Diagnosis Date   Abscess    Anxiety    Arthritis    Chronic back pain    spinal implant   Coronary atherosclerosis    Mild at cardiac catheterization December 2020   Essential hypertension    GERD (gastroesophageal reflux disease)    History of kidney stones    Mixed hyperlipidemia    Myositis    Neuropathy    Recurrent kidney stones    Type 1 diabetes mellitus (HCC)    Insulin pump    Current Outpatient Medications:    amoxicillin (AMOXIL) 500 MG capsule, SMARTSIG:4 Capsule(s) By Mouth, Disp: , Rfl:    amoxicillin-clavulanate (AUGMENTIN) 875-125 MG tablet, Take 1 tablet by mouth 2 (two) times daily., Disp: , Rfl:    aspirin EC 81 MG tablet, Take 81 mg by mouth daily., Disp: , Rfl:    Carboxymethylcellul-Glycerin (LUBRICATING EYE DROPS OP), Place 1 drop into both eyes daily as needed (dry eyes)., Disp: , Rfl:    clobetasol cream (TEMOVATE) 0.05 %, as needed., Disp: , Rfl:    Continuous Blood Gluc Sensor (DEXCOM G7 SENSOR) MISC, , Disp: , Rfl:    ibuprofen (ADVIL) 800 MG tablet, Take 1 tablet (800 mg total) by mouth every 6 (six) hours as needed., Disp: 60 tablet, Rfl: 1   Insulin Pen Needle (BD PEN NEEDLE NANO 2ND GEN) 32G X 4 MM MISC, See admin instructions., Disp: , Rfl:    lidocaine (XYLOCAINE) 5 % ointment, APPLY TO THE AFFECTED AREA 1-4 TIMES DAILY AS NEEDED, Disp: , Rfl:    Lidocaine 5 % CREA, Apply 1  application topically daily as needed (muscle pain). (Patient not taking: Reported on 12/26/2021), Disp: , Rfl:    lisinopril (ZESTRIL) 2.5 MG tablet, Take 2.5 mg by mouth daily., Disp: , Rfl:    metFORMIN (GLUCOPHAGE) 500 MG tablet, Take 500 mg by mouth 2 (two) times daily with a meal., Disp: , Rfl:    metoprolol succinate (TOPROL-XL) 25 MG 24 hr tablet, Take 25 mg by mouth daily., Disp: , Rfl:    montelukast (SINGULAIR) 10 MG tablet, Take 10 mg by mouth daily as needed (allergies). , Disp: , Rfl: 2   MOUNJARO 10 MG/0.5ML Pen, Inject 10 mg into the skin once a week., Disp: , Rfl:    omeprazole (PRILOSEC) 20 MG capsule, Take 1 capsule (20 mg total) by mouth daily., Disp: 90 capsule, Rfl: 3   ondansetron (ZOFRAN) 4 MG tablet, Take 4 mg by mouth as needed., Disp: , Rfl:    Oxycodone HCl 10 MG TABS, take 1 tablet by oral route  every 6-8 hours for severe pain (Patient not taking: Reported on 12/26/2021), Disp: , Rfl:    oxyCODONE-acetaminophen (PERCOCET) 5-325 MG tablet, Take 1 tablet by mouth every 4 (four) hours as needed for severe pain., Disp: 30 tablet, Rfl: 0   pravastatin (PRAVACHOL) 80 MG  tablet, TAKE ONE TABLET BY MOUTH DAILY, Disp: 90 tablet, Rfl: 3   pregabalin (LYRICA) 100 MG capsule, Take 100 mg by mouth 2 (two) times daily., Disp: , Rfl:    TRESIBA FLEXTOUCH 100 UNIT/ML FlexTouch Pen, Inject 25 Units into the skin daily., Disp: , Rfl:   Social History   Tobacco Use  Smoking Status Never  Smokeless Tobacco Never    No Known Allergies Objective:  There were no vitals filed for this visit. There is no height or weight on file to calculate BMI. Constitutional Well developed. Well nourished.  Vascular Foot warm and well perfused. Capillary refill normal to all digits.   Neurologic Normal speech. Oriented to person, place, and time. Epicritic sensation to light touch grossly present bilaterally.  Dermatologic Skin completely epithelialized.  No signs of Deis is noted no  complication noted.  No recurrence of plantar fibroma noted.  Orthopedic: No tenderness to palpation noted about the surgical site.   Radiographs: None Assessment:   No diagnosis found.  Plan:  Patient was evaluated and treated and all questions answered.  S/p foot surgery left Clinically healed and officially discharged from my care.  If any foot and ankle issues on future he will come back and see me.  At this time he can return to regular shoes without any restrictions.  No follow-ups on file.

## 2022-08-16 DIAGNOSIS — H3582 Retinal ischemia: Secondary | ICD-10-CM | POA: Diagnosis not present

## 2022-08-16 DIAGNOSIS — H43813 Vitreous degeneration, bilateral: Secondary | ICD-10-CM | POA: Diagnosis not present

## 2022-08-16 DIAGNOSIS — H35033 Hypertensive retinopathy, bilateral: Secondary | ICD-10-CM | POA: Diagnosis not present

## 2022-08-16 DIAGNOSIS — H4312 Vitreous hemorrhage, left eye: Secondary | ICD-10-CM | POA: Diagnosis not present

## 2022-08-16 DIAGNOSIS — E113513 Type 2 diabetes mellitus with proliferative diabetic retinopathy with macular edema, bilateral: Secondary | ICD-10-CM | POA: Diagnosis not present

## 2022-08-17 ENCOUNTER — Other Ambulatory Visit (HOSPITAL_COMMUNITY): Payer: Self-pay

## 2022-08-21 ENCOUNTER — Ambulatory Visit: Payer: Medicare PPO | Admitting: Podiatry

## 2022-08-21 ENCOUNTER — Encounter: Payer: Self-pay | Admitting: Podiatry

## 2022-08-21 DIAGNOSIS — E1142 Type 2 diabetes mellitus with diabetic polyneuropathy: Secondary | ICD-10-CM | POA: Diagnosis not present

## 2022-08-21 DIAGNOSIS — L603 Nail dystrophy: Secondary | ICD-10-CM | POA: Diagnosis not present

## 2022-08-21 DIAGNOSIS — M79676 Pain in unspecified toe(s): Secondary | ICD-10-CM

## 2022-08-21 DIAGNOSIS — L989 Disorder of the skin and subcutaneous tissue, unspecified: Secondary | ICD-10-CM

## 2022-08-21 NOTE — Progress Notes (Signed)
  Subjective:  Patient ID: David Mcdowell, male    DOB: [birth year],   MRN: [id number]  Chief Complaint  Patient presents with   Nail Problem    DFC    67 y.o. male presents for concern of thickened elongated and painful nails that are difficult to trim. Requesting to have them trimmed today. Relates burning and tingling in their feet. Patient is diabetic and last A1c was  Lab Results  Component Value Date   HGBA1C 8.6 (H) 12/31/2014   .   PCP:  Avis Epley, PA-C    . Denies any other pedal complaints. Denies n/v/f/c.   Past Medical History:  Diagnosis Date   Abscess    Anxiety    Arthritis    Chronic back pain    spinal implant   Coronary atherosclerosis    Mild at cardiac catheterization December 2020   Essential hypertension    GERD (gastroesophageal reflux disease)    History of kidney stones    Mixed hyperlipidemia    Myositis    Neuropathy    Recurrent kidney stones    Type 1 diabetes mellitus (HCC)    Insulin pump    Objective:  Physical Exam: Vascular: DP/PT pulses 2/4 bilateral. CFT <3 seconds. Absent hair growth on digits. Edema noted to bilateral lower extremities. Xerosis noted bilaterally.  Skin. No lacerations or abrasions bilateral feet. Nails 1-5 bilateral  are thickened discolored and elongated with subungual debris.  Musculoskeletal: MMT 5/5 bilateral lower extremities in DF, PF, Inversion and Eversion. Deceased ROM in DF of ankle joint.  Neurological: Sensation intact to light touch. Protective sensation diminished bilateral.     Assessment:   1. Pain around toenail   2. Nail dystrophy   3. Diabetic peripheral neuropathy associated with type 2 diabetes mellitus (HCC)   4. Benign skin lesion      Plan:  Patient was evaluated and treated and all questions answered. -Discussed and educated patient on diabetic foot care, especially with  regards to the vascular, neurological and musculoskeletal systems.  -Stressed the importance of good  glycemic control and the detriment of not  controlling glucose levels in relation to the foot. -Discussed supportive shoes at all times and checking feet regularly.  -Mechanically debrided all nails 1-5 bilateral using sterile nail nipper and filed with dremel without incident  -Answered all patient questions -Patient to return  in 3 months for at risk foot care -Patient advised to call the office if any problems or questions arise in the meantime.   Louann Sjogren, DPM

## 2022-08-22 ENCOUNTER — Ambulatory Visit: Payer: Medicare PPO | Admitting: Podiatry

## 2022-08-25 DIAGNOSIS — J039 Acute tonsillitis, unspecified: Secondary | ICD-10-CM | POA: Diagnosis not present

## 2022-08-25 DIAGNOSIS — Z6832 Body mass index (BMI) 32.0-32.9, adult: Secondary | ICD-10-CM | POA: Diagnosis not present

## 2022-08-25 DIAGNOSIS — E669 Obesity, unspecified: Secondary | ICD-10-CM | POA: Diagnosis not present

## 2022-08-31 DIAGNOSIS — H26493 Other secondary cataract, bilateral: Secondary | ICD-10-CM | POA: Diagnosis not present

## 2022-09-17 DIAGNOSIS — H26491 Other secondary cataract, right eye: Secondary | ICD-10-CM | POA: Diagnosis not present

## 2022-09-18 DIAGNOSIS — E7800 Pure hypercholesterolemia, unspecified: Secondary | ICD-10-CM | POA: Diagnosis not present

## 2022-09-18 DIAGNOSIS — Z125 Encounter for screening for malignant neoplasm of prostate: Secondary | ICD-10-CM | POA: Diagnosis not present

## 2022-09-18 DIAGNOSIS — E1165 Type 2 diabetes mellitus with hyperglycemia: Secondary | ICD-10-CM | POA: Diagnosis not present

## 2022-09-18 DIAGNOSIS — M961 Postlaminectomy syndrome, not elsewhere classified: Secondary | ICD-10-CM | POA: Diagnosis not present

## 2022-09-18 DIAGNOSIS — G609 Hereditary and idiopathic neuropathy, unspecified: Secondary | ICD-10-CM | POA: Diagnosis not present

## 2022-09-18 DIAGNOSIS — Z6832 Body mass index (BMI) 32.0-32.9, adult: Secondary | ICD-10-CM | POA: Diagnosis not present

## 2022-09-18 DIAGNOSIS — E538 Deficiency of other specified B group vitamins: Secondary | ICD-10-CM | POA: Diagnosis not present

## 2022-09-18 DIAGNOSIS — Z9689 Presence of other specified functional implants: Secondary | ICD-10-CM | POA: Diagnosis not present

## 2022-09-21 ENCOUNTER — Other Ambulatory Visit: Payer: Self-pay | Admitting: Cardiology

## 2022-09-24 DIAGNOSIS — H26492 Other secondary cataract, left eye: Secondary | ICD-10-CM | POA: Diagnosis not present

## 2022-10-15 ENCOUNTER — Ambulatory Visit: Payer: Medicare PPO | Admitting: Podiatry

## 2022-10-15 ENCOUNTER — Encounter: Payer: Self-pay | Admitting: Podiatry

## 2022-10-15 DIAGNOSIS — B351 Tinea unguium: Secondary | ICD-10-CM | POA: Diagnosis not present

## 2022-10-15 DIAGNOSIS — M79675 Pain in left toe(s): Secondary | ICD-10-CM

## 2022-10-15 DIAGNOSIS — E1142 Type 2 diabetes mellitus with diabetic polyneuropathy: Secondary | ICD-10-CM

## 2022-10-15 DIAGNOSIS — L603 Nail dystrophy: Secondary | ICD-10-CM

## 2022-10-15 DIAGNOSIS — M79674 Pain in right toe(s): Secondary | ICD-10-CM

## 2022-10-15 NOTE — Progress Notes (Signed)
  Subjective:  Patient ID: David Mcdowell, male    DOB: 06/05/1955,  MRN: [id number]  67 y.o. male presents at risk foot care with history of diabetic neuropathy and painful, discolored, thick toenails which interfere with daily activities  Patient has had foot surgery of left foot with Dr. Allena Katz and states he is doing fine in that regard.  New problem(s): None   PCP is David Epley, PA-C.  No Known Allergies  Review of Systems: Negative except as noted in the HPI.   Objective:  David Mcdowell is a pleasant 67 y.o. male in NAD.Marland Kitchen AAO x 3.  Vascular Examination: Vascular status intact b/l with palpable pedal pulses. CFT immediate b/l. Pedal hair present. No edema. No pain with calf compression b/l. Skin temperature gradient WNL b/l. No varicosities noted. No cyanosis or clubbing noted.  Neurological Examination: Pt has subjective symptoms of neuropathy. Sensation grossly intact b/l with 10 gram monofilament. Vibratory sensation intact b/l.  Dermatological Examination: Pedal skin with normal turgor, texture and tone b/l. No open wounds nor interdigital macerations noted.   Spoon shaped nails 1.5 b/l with centrally depressed nailplate and elevated periphery of mycotic nailplates. There is tenderness to palpation.   Reduced longitudinal scar midfoot with tenderness to palpation.  No corns, calluses nor porokeratotic lesions noted.  Musculoskeletal Examination: Muscle strength 5/5 to b/l LE.  No pain, crepitus noted b/l. No gross pedal deformities. Patient ambulates independently without assistive aids.   Radiographs: None  Last A1c:       No data to display           Assessment:   1. Pain due to onychomycosis of toenails of both feet   2. Koilonychia   3. Diabetic peripheral neuropathy associated with type 2 diabetes mellitus (HCC)    Plan:  -Patient was evaluated and treated. All patient's and/or POA's questions/concerns answered on today's visit. -Patient  with h/o painful plantar scar left foot which is no longer symptomatic s/p foot surgery. -Continue foot and shoe inspections daily. Monitor blood glucose per PCP/Endocrinologist's recommendations. -Continue supportive shoe gear daily. -Mycotic toenails 1-5 bilaterally were debrided in length/girth with sterile nail nippers and dremel without incident. -Patient/POA to call should there be question/concern in the interim.  Return in about 9 weeks (around 12/17/2022).  Freddie Breech, DPM

## 2022-10-17 ENCOUNTER — Encounter: Payer: Self-pay | Admitting: Cardiology

## 2022-10-17 ENCOUNTER — Ambulatory Visit: Payer: Medicare PPO | Attending: Cardiology | Admitting: Cardiology

## 2022-10-17 VITALS — BP 130/90 | HR 86 | Ht 72.0 in | Wt 246.1 lb

## 2022-10-17 DIAGNOSIS — E1165 Type 2 diabetes mellitus with hyperglycemia: Secondary | ICD-10-CM | POA: Diagnosis not present

## 2022-10-17 DIAGNOSIS — E118 Type 2 diabetes mellitus with unspecified complications: Secondary | ICD-10-CM

## 2022-10-17 DIAGNOSIS — I10 Essential (primary) hypertension: Secondary | ICD-10-CM

## 2022-10-17 DIAGNOSIS — I2510 Atherosclerotic heart disease of native coronary artery without angina pectoris: Secondary | ICD-10-CM | POA: Diagnosis not present

## 2022-10-17 NOTE — Progress Notes (Signed)
    Cardiology Office Note  Date: 10/17/2022   ID: David Mcdowell, DOB 04-12-1955, MRN [id number]  History of Present Illness: David Mcdowell is a 67 y.o. male last seen in May 2023.  He is here for a routine visit.  States that he feels well, no exertional chest pain or unusual shortness of breath, has good energy with activities.  I reviewed his medications.  Current cardiovascular regimen includes aspirin, lisinopril, Toprol-XL, and Pravachol.  He is also on Mounjaro.  Just had recent dose increases in his diabetic regimen per endocrinology.  Blood pressure mildly elevated today.  We discussed tracking this at home as he may need up titration of his lisinopril.  Previously, he actually came off of medication due to lower blood pressure with weight loss.  ECG today shows sinus rhythm.  Physical Exam: VS:  BP (!) 130/90 (BP Location: Right Arm, Patient Position: Sitting, Cuff Size: Normal)   Pulse 86   Ht 6' (1.829 m)   Wt 246 lb 1.6 oz (111.6 kg)   SpO2 93%   BMI 33.38 kg/m , BMI Body mass index is 33.38 kg/m.  Wt Readings from Last 3 Encounters:  10/17/22 246 lb 1.6 oz (111.6 kg)  12/26/21 222 lb 12.8 oz (101.1 kg)  05/11/21 240 lb (108.9 kg)    General: Patient appears comfortable at rest. HEENT: Conjunctiva and lids normal. Neck: Supple, no elevated JVP or carotid bruits. Lungs: Clear to auscultation, nonlabored breathing at rest. Cardiac: Regular rate and rhythm, no S3 or significant systolic murmur, no pericardial rub. Extremities: No pitting edema.  ECG:  An ECG dated 05/11/2021 was personally reviewed today and demonstrated:  Sinus rhythm.  Labwork:  September 2024: BUN 14, creatinine 0.82, potassium 4.3, AST 17, ALT 17, cholesterol 151, triglycerides 108, HDL 61, LDL 71, hemoglobin A1c 7.7%, TSH 2.14  Other Studies Reviewed Today:  No interval cardiac testing for review today.  Assessment and Plan:  1.  Mild coronary atherosclerosis by cardiac catheterization  in December 2020.  He remains asymptomatic, ECG reviewed and stable.  Reports good stamina with exertion.  Continue aspirin and Pravachol.   2.  Primary hypertension.  Stage I hypertension.  Continue lisinopril 2.5 mg daily and Toprol-XL 25 mg daily, check blood pressure trend at home.  May need further up titration.   3.  Type 2 diabetes mellitus, followed by Dr. Talmage Mcdowell.  Currently on Mounjaro and Glucophage.  Doses recently adjusted.  Disposition:  Follow up  1 year.  Signed, Jonelle Sidle, M.D., F.A.C.C. [facility] at [facility]

## 2022-10-17 NOTE — Patient Instructions (Signed)
Medication Instructions:  Your physician recommends that you continue on your current medications as directed. Please refer to the Current Medication list given to you today.   Labwork: None today  Testing/Procedures: None today  Follow-Up: 1 year  Any Other Special Instructions Will Be Listed Below (If Applicable).  If you need a refill on your cardiac medications before your next appointment, please call your pharmacy.  

## 2022-10-22 DIAGNOSIS — E1165 Type 2 diabetes mellitus with hyperglycemia: Secondary | ICD-10-CM | POA: Diagnosis not present

## 2022-11-15 DIAGNOSIS — M5135 Other intervertebral disc degeneration, thoracolumbar region: Secondary | ICD-10-CM | POA: Diagnosis not present

## 2022-11-15 DIAGNOSIS — M545 Low back pain, unspecified: Secondary | ICD-10-CM | POA: Diagnosis not present

## 2022-11-15 DIAGNOSIS — Z0001 Encounter for general adult medical examination with abnormal findings: Secondary | ICD-10-CM | POA: Diagnosis not present

## 2022-11-15 DIAGNOSIS — E782 Mixed hyperlipidemia: Secondary | ICD-10-CM | POA: Diagnosis not present

## 2022-11-15 DIAGNOSIS — E11319 Type 2 diabetes mellitus with unspecified diabetic retinopathy without macular edema: Secondary | ICD-10-CM | POA: Diagnosis not present

## 2022-11-15 DIAGNOSIS — F5101 Primary insomnia: Secondary | ICD-10-CM | POA: Diagnosis not present

## 2022-11-15 DIAGNOSIS — G894 Chronic pain syndrome: Secondary | ICD-10-CM | POA: Diagnosis not present

## 2022-11-15 DIAGNOSIS — E8881 Metabolic syndrome: Secondary | ICD-10-CM | POA: Diagnosis not present

## 2022-11-15 DIAGNOSIS — E1140 Type 2 diabetes mellitus with diabetic neuropathy, unspecified: Secondary | ICD-10-CM | POA: Diagnosis not present

## 2022-12-10 DIAGNOSIS — H4312 Vitreous hemorrhage, left eye: Secondary | ICD-10-CM | POA: Diagnosis not present

## 2022-12-10 DIAGNOSIS — H35033 Hypertensive retinopathy, bilateral: Secondary | ICD-10-CM | POA: Diagnosis not present

## 2022-12-10 DIAGNOSIS — E113513 Type 2 diabetes mellitus with proliferative diabetic retinopathy with macular edema, bilateral: Secondary | ICD-10-CM | POA: Diagnosis not present

## 2022-12-10 DIAGNOSIS — H3582 Retinal ischemia: Secondary | ICD-10-CM | POA: Diagnosis not present

## 2022-12-10 DIAGNOSIS — H43813 Vitreous degeneration, bilateral: Secondary | ICD-10-CM | POA: Diagnosis not present

## 2022-12-12 DIAGNOSIS — Z9689 Presence of other specified functional implants: Secondary | ICD-10-CM | POA: Diagnosis not present

## 2022-12-12 DIAGNOSIS — M961 Postlaminectomy syndrome, not elsewhere classified: Secondary | ICD-10-CM | POA: Diagnosis not present

## 2022-12-12 DIAGNOSIS — F1190 Opioid use, unspecified, uncomplicated: Secondary | ICD-10-CM | POA: Diagnosis not present

## 2022-12-14 DIAGNOSIS — E7800 Pure hypercholesterolemia, unspecified: Secondary | ICD-10-CM | POA: Diagnosis not present

## 2022-12-14 DIAGNOSIS — F419 Anxiety disorder, unspecified: Secondary | ICD-10-CM | POA: Diagnosis not present

## 2022-12-14 DIAGNOSIS — E11319 Type 2 diabetes mellitus with unspecified diabetic retinopathy without macular edema: Secondary | ICD-10-CM | POA: Diagnosis not present

## 2022-12-14 DIAGNOSIS — G609 Hereditary and idiopathic neuropathy, unspecified: Secondary | ICD-10-CM | POA: Diagnosis not present

## 2022-12-14 DIAGNOSIS — I10 Essential (primary) hypertension: Secondary | ICD-10-CM | POA: Diagnosis not present

## 2022-12-14 DIAGNOSIS — R635 Abnormal weight gain: Secondary | ICD-10-CM | POA: Diagnosis not present

## 2022-12-14 DIAGNOSIS — E1165 Type 2 diabetes mellitus with hyperglycemia: Secondary | ICD-10-CM | POA: Diagnosis not present

## 2022-12-17 ENCOUNTER — Ambulatory Visit: Payer: Medicare PPO | Admitting: Podiatry

## 2022-12-20 ENCOUNTER — Other Ambulatory Visit: Payer: Self-pay | Admitting: Cardiology

## 2022-12-21 ENCOUNTER — Encounter: Payer: Self-pay | Admitting: Podiatry

## 2022-12-21 ENCOUNTER — Ambulatory Visit (INDEPENDENT_AMBULATORY_CARE_PROVIDER_SITE_OTHER): Payer: Medicare PPO | Admitting: Podiatry

## 2022-12-21 VITALS — Ht 72.0 in | Wt 246.0 lb

## 2022-12-21 DIAGNOSIS — M79674 Pain in right toe(s): Secondary | ICD-10-CM | POA: Diagnosis not present

## 2022-12-21 DIAGNOSIS — M79675 Pain in left toe(s): Secondary | ICD-10-CM

## 2022-12-21 DIAGNOSIS — B351 Tinea unguium: Secondary | ICD-10-CM | POA: Diagnosis not present

## 2022-12-21 DIAGNOSIS — E1142 Type 2 diabetes mellitus with diabetic polyneuropathy: Secondary | ICD-10-CM

## 2022-12-21 DIAGNOSIS — L603 Nail dystrophy: Secondary | ICD-10-CM | POA: Diagnosis not present

## 2022-12-29 ENCOUNTER — Encounter: Payer: Self-pay | Admitting: Podiatry

## 2022-12-29 NOTE — Progress Notes (Signed)
  Subjective:  Patient ID: David Mcdowell, male    DOB: [birth year],  MRN: [id number]  David Mcdowell presents to clinic today for at risk foot care with history of diabetic neuropathy and thick, elongated toenails of both feet which are tender when wearing enclosed shoe gear.  Chief Complaint  Patient presents with   Nail Problem    Pt is here for David Mcdowell last A1C was 8.1 PCP is Dr Jean Rosenthal and LOV was 3 weeks ago.   New problem(s): None.   PCP is Avis Epley, PA-C.  No Known Allergies  Review of Systems: Negative except as noted in the HPI.  Objective: No changes noted in today's physical examination. There were no vitals filed for this visit. David Mcdowell is a pleasant 67 y.o. male in NAD. AAO x 3.  Vascular Examination: Vascular status intact b/l with palpable pedal pulses. CFT immediate b/l. Pedal hair present. No edema. No pain with calf compression b/l. Skin temperature gradient WNL b/l. No varicosities noted. No cyanosis or clubbing noted.  Neurological Examination: Pt has subjective symptoms of neuropathy. Sensation grossly intact b/l with 10 gram monofilament. Vibratory sensation intact b/l.  Dermatological Examination: Pedal skin with normal turgor, texture and tone b/l. No open wounds nor interdigital macerations noted.   Spoon shaped nails 1.5 b/l with centrally depressed nailplate and elevated periphery of mycotic nailplates. There is tenderness to palpation.   Reduced longitudinal scar midfoot with tenderness to palpation.  No corns, calluses nor porokeratotic lesions noted.  Musculoskeletal Examination: Muscle strength 5/5 to b/l LE.  No pain, crepitus noted b/l. No gross pedal deformities. Patient ambulates independently without assistive aids.   Radiographs: None  Assessment/Plan: 1. Pain due to onychomycosis of toenails of both feet   2. Koilonychia   3. Diabetic peripheral neuropathy associated with type 2 diabetes mellitus (HCC)     -Consent  given for treatment as described below: -Examined patient. -Patient to continue soft, supportive shoe gear daily. -Toenails 1-5 bilaterally were debrided in length and girth with sterile nail nippers and dremel. Pinpoint bleeding of left great toe addressed with Lumicain Hemostatic Solution, cleansed with alcohol. Triple antibiotic ointment applied. No further treatment required by patient/caregiver. -Patient/POA to call should there be question/concern in the interim.   Return in about 9 weeks (around 02/22/2023).  David Mcdowell, DPM      [place] LOCATION: [address] [place] [address]                   Office [phone]   [facility] LOCATION: [address] [place], [place] [address] Office [phone]

## 2023-01-20 DIAGNOSIS — E1165 Type 2 diabetes mellitus with hyperglycemia: Secondary | ICD-10-CM | POA: Diagnosis not present

## 2023-03-12 DIAGNOSIS — F1190 Opioid use, unspecified, uncomplicated: Secondary | ICD-10-CM | POA: Diagnosis not present

## 2023-03-12 DIAGNOSIS — Z9689 Presence of other specified functional implants: Secondary | ICD-10-CM | POA: Diagnosis not present

## 2023-03-12 DIAGNOSIS — M961 Postlaminectomy syndrome, not elsewhere classified: Secondary | ICD-10-CM | POA: Diagnosis not present

## 2023-03-14 DIAGNOSIS — I10 Essential (primary) hypertension: Secondary | ICD-10-CM | POA: Diagnosis not present

## 2023-03-14 DIAGNOSIS — G609 Hereditary and idiopathic neuropathy, unspecified: Secondary | ICD-10-CM | POA: Diagnosis not present

## 2023-03-14 DIAGNOSIS — E1165 Type 2 diabetes mellitus with hyperglycemia: Secondary | ICD-10-CM | POA: Diagnosis not present

## 2023-03-21 DIAGNOSIS — E11319 Type 2 diabetes mellitus with unspecified diabetic retinopathy without macular edema: Secondary | ICD-10-CM | POA: Diagnosis not present

## 2023-03-21 DIAGNOSIS — G609 Hereditary and idiopathic neuropathy, unspecified: Secondary | ICD-10-CM | POA: Diagnosis not present

## 2023-03-21 DIAGNOSIS — F419 Anxiety disorder, unspecified: Secondary | ICD-10-CM | POA: Diagnosis not present

## 2023-03-21 DIAGNOSIS — I10 Essential (primary) hypertension: Secondary | ICD-10-CM | POA: Diagnosis not present

## 2023-03-21 DIAGNOSIS — E1165 Type 2 diabetes mellitus with hyperglycemia: Secondary | ICD-10-CM | POA: Diagnosis not present

## 2023-03-21 DIAGNOSIS — E7800 Pure hypercholesterolemia, unspecified: Secondary | ICD-10-CM | POA: Diagnosis not present

## 2023-03-22 ENCOUNTER — Ambulatory Visit (INDEPENDENT_AMBULATORY_CARE_PROVIDER_SITE_OTHER): Payer: Medicare PPO | Admitting: Podiatry

## 2023-03-22 ENCOUNTER — Encounter: Payer: Self-pay | Admitting: Podiatry

## 2023-03-22 DIAGNOSIS — M79675 Pain in left toe(s): Secondary | ICD-10-CM

## 2023-03-22 DIAGNOSIS — B351 Tinea unguium: Secondary | ICD-10-CM

## 2023-03-22 DIAGNOSIS — E1142 Type 2 diabetes mellitus with diabetic polyneuropathy: Secondary | ICD-10-CM | POA: Diagnosis not present

## 2023-03-22 DIAGNOSIS — L603 Nail dystrophy: Secondary | ICD-10-CM | POA: Diagnosis not present

## 2023-03-22 DIAGNOSIS — M79674 Pain in right toe(s): Secondary | ICD-10-CM

## 2023-03-31 NOTE — Progress Notes (Signed)
  Subjective:  Patient ID: David Mcdowell, male    DOB: 25-Dec-1955,  MRN: [id number]  68 y.o. male presents at risk foot care with history of diabetic neuropathy and painful, elongated thickened toenails x 10 which are symptomatic when wearing enclosed shoe gear. This interferes with his/her daily activities.  Patient states the family will be taking his Mom to [facility] for [facility] in April. Chief Complaint  Patient presents with   Diabetes    "She works on my nails every three months because they grow in."  [name] - 03/21/23, A1c - 8.0    New problem(s): None   PCP is Avis Epley, PA-C.  No Known Allergies  Review of Systems: Negative except as noted in the HPI.   Objective:  David Mcdowell is a pleasant 68 y.o. male obese in NAD. AAO x 3.  Vascular Examination: Vascular status intact b/l with palpable pedal pulses. CFT immediate b/l. Pedal hair present. No edema. No pain with calf compression b/l. Skin temperature gradient WNL b/l. No varicosities noted. No cyanosis or clubbing noted.  Neurological Examination: Sensation grossly intact b/l with 10 gram monofilament. Vibratory sensation intact b/l. Pt has subjective symptoms of neuropathy.  Dermatological Examination: Pedal skin with normal turgor, texture and tone b/l. No open wounds nor interdigital macerations noted. Spoon shaped nails 1.5 b/l with centrally depressed nailplate and elevated periphery of mycotic nailplates. There is tenderness to palpation.   Reduced longitudinal scar midfoot with tenderness to palpation. No hyperkeratotic lesions noted b/l.   Musculoskeletal Examination: Muscle strength 5/5 to b/l LE.  No pain, crepitus noted b/l. No gross pedal deformities. Patient ambulates independently without assistive aids.   Radiographs: None  Last A1c:       No data to display         Assessment:   1. Pain due to onychomycosis of toenails of both feet   2. Koilonychia   3. Diabetic  peripheral neuropathy associated with type 2 diabetes mellitus (HCC)    Plan:  -Patient was evaluated today. All questions/concerns addressed on today's visit. -Continue supportive shoe gear daily. -Mycotic toenails 1-5 bilaterally were debrided in length and girth with sterile nail nippers and dremel. Pinpoint bleeding of right great toe addressed with Lumicain Hemostatic Solution, cleansed with alcohol. Triple antibiotic ointment applied. Patient/caregiver instructed to apply Neosporin Cream once daily for 7 days. -Patient/POA to call should there be question/concern in the interim.  Return in about 9 weeks (around 05/24/2023).  David Mcdowell, DPM      [place] LOCATION: [address] [place] [address]                   Office [phone]   [facility] LOCATION: [address] [place], [place] [address] Office [phone]

## 2023-04-15 DIAGNOSIS — H43813 Vitreous degeneration, bilateral: Secondary | ICD-10-CM | POA: Diagnosis not present

## 2023-04-15 DIAGNOSIS — H4312 Vitreous hemorrhage, left eye: Secondary | ICD-10-CM | POA: Diagnosis not present

## 2023-04-15 DIAGNOSIS — E113592 Type 2 diabetes mellitus with proliferative diabetic retinopathy without macular edema, left eye: Secondary | ICD-10-CM | POA: Diagnosis not present

## 2023-04-15 DIAGNOSIS — E1165 Type 2 diabetes mellitus with hyperglycemia: Secondary | ICD-10-CM | POA: Diagnosis not present

## 2023-04-15 DIAGNOSIS — E113511 Type 2 diabetes mellitus with proliferative diabetic retinopathy with macular edema, right eye: Secondary | ICD-10-CM | POA: Diagnosis not present

## 2023-04-15 DIAGNOSIS — H35033 Hypertensive retinopathy, bilateral: Secondary | ICD-10-CM | POA: Diagnosis not present

## 2023-04-15 DIAGNOSIS — H3582 Retinal ischemia: Secondary | ICD-10-CM | POA: Diagnosis not present

## 2023-04-17 DIAGNOSIS — E1165 Type 2 diabetes mellitus with hyperglycemia: Secondary | ICD-10-CM | POA: Diagnosis not present

## 2023-04-30 DIAGNOSIS — Z6835 Body mass index (BMI) 35.0-35.9, adult: Secondary | ICD-10-CM | POA: Diagnosis not present

## 2023-04-30 DIAGNOSIS — E6609 Other obesity due to excess calories: Secondary | ICD-10-CM | POA: Diagnosis not present

## 2023-04-30 DIAGNOSIS — L309 Dermatitis, unspecified: Secondary | ICD-10-CM | POA: Diagnosis not present

## 2023-05-30 ENCOUNTER — Ambulatory Visit: Admitting: Podiatry

## 2023-05-30 ENCOUNTER — Encounter: Payer: Self-pay | Admitting: Podiatry

## 2023-05-30 DIAGNOSIS — L84 Corns and callosities: Secondary | ICD-10-CM | POA: Diagnosis not present

## 2023-05-30 DIAGNOSIS — L603 Nail dystrophy: Secondary | ICD-10-CM | POA: Diagnosis not present

## 2023-05-30 DIAGNOSIS — B351 Tinea unguium: Secondary | ICD-10-CM

## 2023-05-30 DIAGNOSIS — M79674 Pain in right toe(s): Secondary | ICD-10-CM

## 2023-05-30 DIAGNOSIS — M79675 Pain in left toe(s): Secondary | ICD-10-CM | POA: Diagnosis not present

## 2023-05-30 DIAGNOSIS — E1142 Type 2 diabetes mellitus with diabetic polyneuropathy: Secondary | ICD-10-CM

## 2023-05-30 NOTE — Progress Notes (Unsigned)
  Subjective:  Patient ID: David Mcdowell, male    DOB: Jun 01, 1955,  MRN: [id number]  David Mcdowell presents to clinic today for at risk foot care with history of diabetic neuropathy and painful, discolored, thick toenails which interfere with daily activities  Chief Complaint  Patient presents with   Diabetes    "Do my toenails and do an overall check of my feet. " Dr. Ronelle Coffee - 03/21/2023; A1c - 8?   New problem(s): None.   PCP is Roxene Cora, PA-C.  No Known Allergies  Review of Systems: Negative except as noted in the HPI.  Objective: No changes noted in today's physical examination. There were no vitals filed for this visit. David Mcdowell is a pleasant 68 y.o. male {jgbodyhabitus:[id number]} AAO x 3.  Vascular Examination: Vascular status intact b/l with palpable pedal pulses. CFT immediate b/l. Pedal hair present. No edema. No pain with calf compression b/l. Skin temperature gradient WNL b/l. No varicosities noted. No cyanosis or clubbing noted.  Neurological Examination: Sensation grossly intact b/l with 10 gram monofilament. Vibratory sensation intact b/l. Pt has subjective symptoms of neuropathy.  Dermatological Examination: Pedal skin with normal turgor, texture and tone b/l. No open wounds nor interdigital macerations noted. Spoon shaped nails 1.5 b/l with centrally depressed nailplate and elevated periphery of mycotic nailplates. There is tenderness to palpation.   Reduced longitudinal scar midfoot with tenderness to palpation. No hyperkeratotic lesions noted b/l.   Musculoskeletal Examination: Muscle strength 5/5 to b/l LE.  No pain, crepitus noted b/l. No gross pedal deformities. Patient ambulates independently without assistive aids.   Radiographs: None  Assessment/Plan: 1. Pain due to onychomycosis of toenails of both feet   2. Koilonychia   3. Diabetic peripheral neuropathy associated with type 2 diabetes mellitus (HCC)     No orders of the defined types  were placed in this encounter.   None {Jgplan:[id number]::"-Patient/POA to call should there be question/concern in the interim."}   Return in about 9 weeks (around 08/01/2023).  Luella Sager, DPM      [place] LOCATION: [address] [place] [address]                   Office [phone]   [facility] LOCATION: [address] [place], [place] [address] Office [phone]

## 2023-06-06 DIAGNOSIS — F1190 Opioid use, unspecified, uncomplicated: Secondary | ICD-10-CM | POA: Diagnosis not present

## 2023-06-06 DIAGNOSIS — M961 Postlaminectomy syndrome, not elsewhere classified: Secondary | ICD-10-CM | POA: Diagnosis not present

## 2023-06-06 DIAGNOSIS — Z9689 Presence of other specified functional implants: Secondary | ICD-10-CM | POA: Diagnosis not present

## 2023-07-08 DIAGNOSIS — E7800 Pure hypercholesterolemia, unspecified: Secondary | ICD-10-CM | POA: Diagnosis not present

## 2023-07-08 DIAGNOSIS — E1165 Type 2 diabetes mellitus with hyperglycemia: Secondary | ICD-10-CM | POA: Diagnosis not present

## 2023-07-15 DIAGNOSIS — F419 Anxiety disorder, unspecified: Secondary | ICD-10-CM | POA: Diagnosis not present

## 2023-07-15 DIAGNOSIS — E1165 Type 2 diabetes mellitus with hyperglycemia: Secondary | ICD-10-CM | POA: Diagnosis not present

## 2023-07-15 DIAGNOSIS — I10 Essential (primary) hypertension: Secondary | ICD-10-CM | POA: Diagnosis not present

## 2023-07-15 DIAGNOSIS — E7800 Pure hypercholesterolemia, unspecified: Secondary | ICD-10-CM | POA: Diagnosis not present

## 2023-07-15 DIAGNOSIS — E11319 Type 2 diabetes mellitus with unspecified diabetic retinopathy without macular edema: Secondary | ICD-10-CM | POA: Diagnosis not present

## 2023-07-15 DIAGNOSIS — Z4681 Encounter for fitting and adjustment of insulin pump: Secondary | ICD-10-CM | POA: Diagnosis not present

## 2023-07-15 DIAGNOSIS — G609 Hereditary and idiopathic neuropathy, unspecified: Secondary | ICD-10-CM | POA: Diagnosis not present

## 2023-07-16 DIAGNOSIS — E1165 Type 2 diabetes mellitus with hyperglycemia: Secondary | ICD-10-CM | POA: Diagnosis not present

## 2023-08-01 ENCOUNTER — Ambulatory Visit: Admitting: Podiatry

## 2023-08-01 ENCOUNTER — Encounter: Payer: Self-pay | Admitting: Podiatry

## 2023-08-01 DIAGNOSIS — M79675 Pain in left toe(s): Secondary | ICD-10-CM

## 2023-08-01 DIAGNOSIS — M79674 Pain in right toe(s): Secondary | ICD-10-CM | POA: Diagnosis not present

## 2023-08-01 DIAGNOSIS — E1142 Type 2 diabetes mellitus with diabetic polyneuropathy: Secondary | ICD-10-CM

## 2023-08-01 DIAGNOSIS — E119 Type 2 diabetes mellitus without complications: Secondary | ICD-10-CM

## 2023-08-01 DIAGNOSIS — B351 Tinea unguium: Secondary | ICD-10-CM | POA: Diagnosis not present

## 2023-08-01 DIAGNOSIS — L603 Nail dystrophy: Secondary | ICD-10-CM | POA: Diagnosis not present

## 2023-08-01 DIAGNOSIS — S90222A Contusion of left lesser toe(s) with damage to nail, initial encounter: Secondary | ICD-10-CM

## 2023-08-01 NOTE — Progress Notes (Signed)
 ANNUAL DIABETIC FOOT EXAM  Subjective: David Mcdowell presents today for annual diabetic foot exam. He states his toe pain dissipates when he takes his Lyrica . If he misses a dose, he has discomfort in his toes Chief Complaint  Patient presents with   Nail Problem    Thick painful toenails, 9 week follow up    Patient confirms h/o diabetes.  Patient denies any h/o foot wounds.  Patient has been diagnosed with neuropathy.  David Lucie PARAS, PA-C is patient's PCP.  Past Medical History:  Diagnosis Date   Abscess    Anxiety    Arthritis    Chronic back pain    spinal implant   Coronary atherosclerosis    Mild at cardiac catheterization December 2020   Essential hypertension    GERD (gastroesophageal reflux disease)    History of kidney stones    Mixed hyperlipidemia    Myositis    Neuropathy    Recurrent kidney stones    Type 1 diabetes mellitus (HCC)    Insulin  pump   Patient Active Problem List   Diagnosis Date Noted   Anxiety disorder 08/19/2020   Diabetic retinopathy (HCC) 08/19/2020   Hereditary and idiopathic neuropathy, unspecified 08/19/2020   Hyperglycemia due to type 2 diabetes mellitus (HCC) 08/19/2020   Presence of insulin  pump (external) (internal) 08/19/2020   Spinal cord stimulator status 06/15/2019   Lumbar radicular pain 05/12/2019   Lumbar post-laminectomy syndrome 05/07/2019   Trochanteric bursitis of right hip 02/25/2019   Body mass index (BMI) 38.0-38.9, adult 01/13/2019   Progressive angina (HCC)    Chronic low back pain 05/29/2017   S/P lumbar spinal fusion 07/25/2016   DJD (degenerative joint disease) of knee 01/12/2015   Groin abscess 02/06/2014   Abscess of groin, right 02/06/2014   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   Cyst of buttocks 11/25/2012   Mixed hyperlipidemia 07/08/2012   Dyspepsia 07/08/2012   Precordial pain 05/04/2010   Type 2 diabetes mellitus, uncontrolled, with neuropathy 05/04/2010   Essential hypertension, benign  05/04/2010   Past Surgical History:  Procedure Laterality Date   ABDOMINAL EXPOSURE N/A 07/25/2016   Procedure: ABDOMINAL EXPOSURE;  Surgeon: Oris Krystal FALCON, MD;  Location: [facility] OR;  Service: Vascular;  Laterality: N/A;   ANTERIOR LUMBAR FUSION N/A 07/25/2016   Procedure: LUMBAR FOUR-FIVE ANTERIOR LUMBAR INTERBODY FUSION;  Surgeon: Joshua Alm RAMAN, MD;  Location: [facility] OR;  Service: Neurosurgery;  Laterality: N/A;   BACK SURGERY     CARPAL TUNNEL RELEASE     DORSAL COMPARTMENT RELEASE Left 12/22/2015   Procedure: RELEASE DORSAL COMPARTMENT (DEQUERVAIN), left wrist;  Surgeon: Toribio FALCON Chancy, MD;  Location: [facility];  Service: Orthopedics;  Laterality: Left;  block   ELBOW SURGERY     both tendon repairs( both elbows)   INCISION AND DRAINAGE ABSCESS Right 02/06/2014   Procedure: INCISION AND DRAINAGE ABSCESS right groin abcess debridement skin and subqutaneous tissue;  Surgeon: Morene Olives, MD;  Location: [facility] ORS;  Service: General;  Laterality: Right;   LEFT HEART CATH AND CORONARY ANGIOGRAPHY N/A 12/16/2018   Procedure: LEFT HEART CATH AND CORONARY ANGIOGRAPHY;  Surgeon: Dann Candyce RAMAN, MD;  Location: [facility] INVASIVE CV LAB;  Service: Cardiovascular;  Laterality: N/A;   STERIOD INJECTION Right 12/22/2015   Procedure: STEROID INJECTION;  Surgeon: Toribio FALCON Chancy, MD;  Location: [facility];  Service: Orthopedics;  Laterality: Right;   TOTAL KNEE ARTHROPLASTY Left 01/12/2015   Procedure: LEFT TOTAL KNEE ARTHROPLASTY;  Surgeon: Toribio  JULIANNA Chancy, MD;  Location: [facility] OR;  Service: Orthopedics;  Laterality: Left;   Current Outpatient Medications on File Prior to Visit  Medication Sig Dispense Refill   amoxicillin  (AMOXIL ) 500 MG capsule  (Patient not taking: Reported on 05/30/2023)     amoxicillin -clavulanate (AUGMENTIN ) 875-125 MG tablet Take 1 tablet by mouth 2 (two) times daily. (Patient not taking: Reported on 05/30/2023)     aspirin  EC 81 MG tablet Take 81 mg by mouth  daily.     Continuous Blood Gluc Sensor (DEXCOM G7 SENSOR) MISC      cyanocobalamin  (VITAMIN B12) 1000 MCG/ML injection      HUMALOG 100 UNIT/ML injection 100u/day Injection via pump DX E11.65 for 30 days     Insulin  Pen Needle (BD PEN NEEDLE NANO 2ND GEN) 32G X 4 MM MISC See admin instructions.     lidocaine  (XYLOCAINE ) 5 % ointment APPLY TO THE AFFECTED AREA 1-4 TIMES DAILY AS NEEDED     lisinopril  (ZESTRIL ) 2.5 MG tablet Take 2.5 mg by mouth daily.     metFORMIN  (GLUCOPHAGE ) 500 MG tablet Take 500 mg by mouth 2 (two) times daily with a meal.     metoprolol  succinate (TOPROL -XL) 25 MG 24 hr tablet Take 25 mg by mouth daily.     montelukast (SINGULAIR) 10 MG tablet Take 10 mg by mouth daily as needed (allergies).   2   ondansetron  (ZOFRAN ) 4 MG tablet Take 4 mg by mouth as needed.     pravastatin  (PRAVACHOL ) 80 MG tablet TAKE ONE TABLET BY MOUTH EVERY DAY 90 tablet 3   pregabalin  (LYRICA ) 100 MG capsule Take 100 mg by mouth 2 (two) times daily.     tirzepatide (MOUNJARO) 15 MG/0.5ML Pen Inject 15 mg into the skin once a week.     TRESIBA FLEXTOUCH 100 UNIT/ML FlexTouch Pen Inject 25 Units into the skin daily. (Patient not taking: Reported on 05/30/2023)     No current facility-administered medications on file prior to visit.    No Known Allergies Social History   Occupational History   Occupation: retired  Tobacco Use   Smoking status: Never   Smokeless tobacco: Never  Vaping Use   Vaping status: Never Used  Substance and Sexual Activity   Alcohol use: No    Alcohol/week: 0.0 standard drinks of alcohol   Drug use: No   Sexual activity: Not on file   Family History  Problem Relation Age of Onset   Asthma Mother    Heart disease Father    Diabetes Father    Asthma Sister    Healthy Brother    Coronary artery disease Other    Leukemia Paternal Grandfather    Cirrhosis Maternal Aunt    Colon cancer Neg Hx    Rectal cancer Neg Hx    Esophageal cancer Neg Hx    Stomach cancer  Neg Hx    Liver cancer Neg Hx    Immunization History  Administered Date(s) Administered   Influenza Inj Mdck Quad With Preservative 10/20/2018   Influenza-Unspecified 09/08/2017   Moderna Sars-Covid-2 Vaccination 03/10/2019, 04/07/2019, 01/18/2020     Review of Systems: Negative except as noted in the HPI.   Objective: There were no vitals filed for this visit.  David Mcdowell is a pleasant 68 y.o. male in NAD. AAO X 3.  Diabetic foot exam was performed with the following findings:   Vascular Examination: Capillary refill time immediate b/l. Palpable pedal pulses. Pedal hair present b/l. No pain with calf  compression b/l. Skin temperature gradient WNL b/l. No cyanosis or clubbing b/l. No ischemia or gangrene noted b/l. No edema noted b/l LE.  Neurological Examination: Sensation grossly intact b/l with 10 gram monofilament. Vibratory sensation intact b/l. Pt has subjective symptoms of neuropathy.  Dermatological Examination: Pedal skin with normal turgor, texture and tone b/l.  No open wounds. No interdigital macerations.   Toenails 1-5 b/l thick, discolored, elongated with subungual debris and pain on dorsal palpation.   Spoon shaped toenails 1-5 right, 2-5 left. There is evidence of subungual seroma of the L hallux. There is onycholysis of nailplate. Small amount of serous fluid expressed subungually. There is no  tenderness to palpation. No erythema, no edema..  Musculoskeletal Examination: Muscle strength 5/5 to all lower extremity muscle groups bilaterally. No pain, crepitus or joint limitation noted with ROM b/l LE. No gross bony pedal deformities b/l. Patient ambulates independently without assistive aids.  Radiographs: None      Lab Results  Component Value Date   HGBA1C 8.6 (H) 12/31/2014   ADA Risk Categorization: Low Risk :  Patient has all of the following: Intact protective sensation No prior foot ulcer  No severe deformity Pedal pulses  present  Assessment: 1. Pain due to onychomycosis of toenails of both feet   2. Subungual contusion of toe of left foot, initial encounter   3. Koilonychia   4. Diabetic peripheral neuropathy associated with type 2 diabetes mellitus (HCC)   5. Encounter for diabetic foot exam [facility])     Plan: -Patient was evaluated today. All questions/concerns addressed on today's visit. -Diabetic foot examination performed today. -Continue diabetic foot care principles: inspect feet daily, monitor glucose as recommended by PCP and/or Endocrinologist, and follow prescribed diet per PCP, Endocrinologist and/or dietician. -Patient to continue soft, supportive shoe gear daily. -Toenails were debrided in length and girth 1-5 right foot and 2-5 left foot with sterile nail nippers and dremel without iatrogenic bleeding.  -Loose nailplate left great toe gently debrided to level of adherence. Digit cleansed with alcohol. Betadine Solution applied to nailbed followed by light dressing. Patient/Caregiver/POA instructed to apply Betadine Solution to L hallux once daily for 7 days. -Patient/POA to call should there be question/concern in the interim. Return in about 9 weeks (around 10/03/2023).  Delon LITTIE Merlin, DPM      [place] LOCATION: [address], [place] [address]                   Office [phone]   [facility] LOCATION: [address] [place], [place] [address] Office [phone]

## 2023-08-02 DIAGNOSIS — E1165 Type 2 diabetes mellitus with hyperglycemia: Secondary | ICD-10-CM | POA: Diagnosis not present

## 2023-08-29 DIAGNOSIS — H4312 Vitreous hemorrhage, left eye: Secondary | ICD-10-CM | POA: Diagnosis not present

## 2023-08-29 DIAGNOSIS — H3582 Retinal ischemia: Secondary | ICD-10-CM | POA: Diagnosis not present

## 2023-08-29 DIAGNOSIS — E113592 Type 2 diabetes mellitus with proliferative diabetic retinopathy without macular edema, left eye: Secondary | ICD-10-CM | POA: Diagnosis not present

## 2023-08-29 DIAGNOSIS — E113511 Type 2 diabetes mellitus with proliferative diabetic retinopathy with macular edema, right eye: Secondary | ICD-10-CM | POA: Diagnosis not present

## 2023-08-29 DIAGNOSIS — H43391 Other vitreous opacities, right eye: Secondary | ICD-10-CM | POA: Diagnosis not present

## 2023-08-29 DIAGNOSIS — H35033 Hypertensive retinopathy, bilateral: Secondary | ICD-10-CM | POA: Diagnosis not present

## 2023-08-29 DIAGNOSIS — H31092 Other chorioretinal scars, left eye: Secondary | ICD-10-CM | POA: Diagnosis not present

## 2023-08-29 DIAGNOSIS — H43813 Vitreous degeneration, bilateral: Secondary | ICD-10-CM | POA: Diagnosis not present

## 2023-09-10 DIAGNOSIS — M961 Postlaminectomy syndrome, not elsewhere classified: Secondary | ICD-10-CM | POA: Diagnosis not present

## 2023-09-10 DIAGNOSIS — F1190 Opioid use, unspecified, uncomplicated: Secondary | ICD-10-CM | POA: Diagnosis not present

## 2023-09-10 DIAGNOSIS — Z9689 Presence of other specified functional implants: Secondary | ICD-10-CM | POA: Diagnosis not present

## 2023-09-19 DIAGNOSIS — Z1331 Encounter for screening for depression: Secondary | ICD-10-CM | POA: Diagnosis not present

## 2023-09-19 DIAGNOSIS — Z0001 Encounter for general adult medical examination with abnormal findings: Secondary | ICD-10-CM | POA: Diagnosis not present

## 2023-09-19 DIAGNOSIS — E6609 Other obesity due to excess calories: Secondary | ICD-10-CM | POA: Diagnosis not present

## 2023-09-19 DIAGNOSIS — Z6835 Body mass index (BMI) 35.0-35.9, adult: Secondary | ICD-10-CM | POA: Diagnosis not present

## 2023-09-19 DIAGNOSIS — G894 Chronic pain syndrome: Secondary | ICD-10-CM | POA: Diagnosis not present

## 2023-09-23 DIAGNOSIS — M7711 Lateral epicondylitis, right elbow: Secondary | ICD-10-CM | POA: Diagnosis not present

## 2023-10-04 DIAGNOSIS — E1165 Type 2 diabetes mellitus with hyperglycemia: Secondary | ICD-10-CM | POA: Diagnosis not present

## 2023-10-10 ENCOUNTER — Encounter: Payer: Self-pay | Admitting: Podiatry

## 2023-10-10 ENCOUNTER — Ambulatory Visit: Admitting: Podiatry

## 2023-10-10 DIAGNOSIS — B351 Tinea unguium: Secondary | ICD-10-CM

## 2023-10-10 DIAGNOSIS — M79675 Pain in left toe(s): Secondary | ICD-10-CM

## 2023-10-10 DIAGNOSIS — Q828 Other specified congenital malformations of skin: Secondary | ICD-10-CM

## 2023-10-10 DIAGNOSIS — L84 Corns and callosities: Secondary | ICD-10-CM

## 2023-10-10 DIAGNOSIS — M79674 Pain in right toe(s): Secondary | ICD-10-CM

## 2023-10-10 DIAGNOSIS — E1142 Type 2 diabetes mellitus with diabetic polyneuropathy: Secondary | ICD-10-CM | POA: Diagnosis not present

## 2023-10-10 NOTE — Progress Notes (Signed)
 Subjective:  Patient ID: David Mcdowell, male    DOB: 09-27-1955,  MRN: [id number]  David Mcdowell presents to clinic today for at risk foot care with history of diabetic neuropathy and painful thick toenails that are difficult to trim. Pain interferes with ambulation. Aggravating factors include wearing enclosed shoe gear. Pain is relieved with periodic professional debridement. Patient states he went on vacation to the beach and walked a lot. He also walks for exercise. Plantar scar is tender today as well as a new spot on the forefoot area of his left foot. Chief Complaint  Patient presents with   Nail Problem    Diabetic foot care.  A1c is 7.9. Dr. Lucie Mace is his PCP. Last visit was x 2 weeks ago. He is concerned about the scar tissue on his left foot-arch area and a spot under his 4th toe   New problem(s): None.   PCP is Mace Lucie PARAS, PA-C.  No Known Allergies  Review of Systems: Negative except as noted in the HPI.  Objective:  There were no vitals filed for this visit. David Mcdowell is a pleasant 68 y.o. male in NAD. AAO x 3.  Vascular Examination: Capillary refill time immediate b/l. Palpable pedal pulses. Pedal hair present b/l. No pain with calf compression b/l. Skin temperature gradient WNL b/l. No cyanosis or clubbing b/l. No ischemia or gangrene noted b/l. No edema noted b/l LE.  Neurological Examination: Sensation grossly intact b/l with 10 gram monofilament. Vibratory sensation intact b/l. Pt has subjective symptoms of neuropathy.  Dermatological Examination: Hyperkeratotic lesion(s) plantar surgical scar left foot.  No erythema, no edema, no drainage, no fluctuance. Porokeratotic lesion(s) sulcus left forefoot near left 3rd digit. No erythema, no edema, no drainage, no fluctuance.  Pedal skin with normal turgor, texture and tone b/l.  No open wounds. No interdigital macerations.   Spoon shaped mycotic toenails 1-5 b/l. There is tenderness to  palpation. No erythema, no edema.  Musculoskeletal Examination: Muscle strength 5/5 to all lower extremity muscle groups bilaterally. No pain, crepitus or joint limitation noted with ROM b/l LE. No gross bony pedal deformities b/l. Patient ambulates independently without assistive aids.  Radiographs: None  Assessment/Plan: 1. Pain due to onychomycosis of toenails of both feet   2. Callus   3. Porokeratosis   4. Diabetic peripheral neuropathy associated with type 2 diabetes mellitus (HCC)   -Patient was evaluated today. All questions/concerns addressed on today's visit. -Patient to continue soft, supportive shoe gear daily. -Mycotic toenails 1-5 bilaterally were debrided in length and girth with sterile nail nippers and dremel without incident. -Callus(es) plantar scar left foot pared utilizing sterile scalpel blade without complication or incident. Total number debrided =1. -Porokeratotic lesion(s) sulcus plantar forefoot pared and enucleated with sterile currette without incident. Total number of lesions debrided=1. -Patient/POA to call should there be question/concern in the interim.   Return in about 9 weeks (around 12/12/2023).  Delon LITTIE Merlin, DPM      [place] LOCATION: [address], [place] [address]                   Office [phone]   [facility] LOCATION: [address] [place], [place] [address]  Office [phone]

## 2023-10-21 ENCOUNTER — Encounter: Payer: Self-pay | Admitting: Cardiology

## 2023-10-21 ENCOUNTER — Ambulatory Visit: Attending: Cardiology | Admitting: Cardiology

## 2023-10-21 VITALS — BP 116/64 | HR 99 | Ht 72.0 in | Wt 264.2 lb

## 2023-10-21 DIAGNOSIS — Z794 Long term (current) use of insulin: Secondary | ICD-10-CM | POA: Diagnosis not present

## 2023-10-21 DIAGNOSIS — E118 Type 2 diabetes mellitus with unspecified complications: Secondary | ICD-10-CM

## 2023-10-21 DIAGNOSIS — I10 Essential (primary) hypertension: Secondary | ICD-10-CM | POA: Diagnosis not present

## 2023-10-21 DIAGNOSIS — I2510 Atherosclerotic heart disease of native coronary artery without angina pectoris: Secondary | ICD-10-CM | POA: Diagnosis not present

## 2023-10-21 NOTE — Patient Instructions (Addendum)
 Medication Instructions:  Your physician recommends that you continue on your current medications as directed. Please refer to the Current Medication list given to you today.  Labwork: none  Testing/Procedures: none  Follow-Up: Your physician recommends that you schedule a follow-up appointment in: 1 year. You will receive a reminder call in about months reminding you to schedule your appointment. If you don't receive this call, please contact our office.  Any Other Special Instructions Will Be Listed Below (If Applicable). Please schedule an appointment for an EKG with the nurse.  If you need a refill on your cardiac medications before your next appointment, please call your pharmacy.

## 2023-10-21 NOTE — Progress Notes (Signed)
    Cardiology Office Note  Date: 10/21/2023   ID: RENA SWEEDEN, DOB 30-Nov-1955, MRN [id number]  History of Present Illness: David Mcdowell is a 68 y.o. male last seen in October 2024.  He is here for a routine visit.  He does not report any exertional chest pain or change in stamina.  States that he goes to the gym 2 days a week and also enjoys doing genealogy work at home.  I reviewed his medications.  He continues to follow with endocrinology and has gone back on insulin  pump in the last few months.  Hemoglobin A1c came down from 7.8% to 6.2% on current regimen.  Lipids are well-controlled and he continues on Pravachol  80 mg daily.  Unable to obtain ECG today due to artifact with spinal stimulator in place.  He plans to come back for nurse visit when he can deactivate the device temporarily.  Physical Exam: VS:  BP 116/64   Pulse 99   Ht 6' (1.829 m)   Wt 264 lb 3.2 oz (119.8 kg)   SpO2 93%   BMI 35.83 kg/m , BMI Body mass index is 35.83 kg/m.  Wt Readings from Last 3 Encounters:  10/21/23 264 lb 3.2 oz (119.8 kg)  12/21/22 246 lb (111.6 kg)  10/17/22 246 lb 1.6 oz (111.6 kg)    General: Patient appears comfortable at rest. HEENT: Conjunctiva and lids normal Neck: Supple, no elevated JVP or carotid bruits. Lungs: Clear to auscultation, nonlabored breathing at rest. Cardiac: Regular rate and rhythm, no S3, 2/6 systolic murmur. Extremities: No pitting edema.  ECG:  An ECG dated 10/17/2022 was personally reviewed today and demonstrated:  Sinus rhythm.  Labwork:  June 2025: BUN 12, creatinine 0.88, potassium 4.5, AST 27, ALT 26, cholesterol 141, triglycerides 158, HDL 53, LDL 61, hemoglobin A1c 7.8% October 2025: Hemoglobin A1c 6.2%  Other Studies Reviewed Today:  No interval cardiac testing for review today.  Assessment and Plan:  1.  Mild coronary atherosclerosis by cardiac catheterization in December 2020.  Reports no angina, exercising at gym twice a week.  He  will come back for nurse visit with ECG when able to temporarily deactivate spinal stimulator.  Continue aspirin  81 mg daily and Pravachol  80 mg daily.  LDL 61 and HDL 53 in June.   2.  Primary hypertension.  Blood pressure well-controlled today.  Continue Toprol -XL 25 mg daily and lisinopril  2.5 mg daily.   3.  Type 2 diabetes mellitus, followed by endocrinology.  Hemoglobin A1c 6.2% in October.  He is currently on insulin  pump, Jardiance 25 mg daily, Glucophage  1000 mg twice daily, and Mounjaro 15 mg weekly.  Disposition:  Follow up 1 year.  Signed, Jayson JUDITHANN Sierras, M.D., F.A.C.C. [facility]

## 2023-10-21 NOTE — Addendum Note (Signed)
 Addended by: Crystal Scarberry M on: 10/21/2023 09:13 AM   Modules accepted: Orders

## 2023-10-30 ENCOUNTER — Ambulatory Visit: Admitting: Cardiology

## 2023-12-10 DIAGNOSIS — Z9689 Presence of other specified functional implants: Secondary | ICD-10-CM | POA: Diagnosis not present

## 2023-12-10 DIAGNOSIS — M961 Postlaminectomy syndrome, not elsewhere classified: Secondary | ICD-10-CM | POA: Diagnosis not present

## 2023-12-10 DIAGNOSIS — F1190 Opioid use, unspecified, uncomplicated: Secondary | ICD-10-CM | POA: Diagnosis not present

## 2023-12-12 ENCOUNTER — Encounter: Payer: Self-pay | Admitting: Podiatry

## 2023-12-12 ENCOUNTER — Ambulatory Visit: Admitting: Podiatry

## 2023-12-12 DIAGNOSIS — E1142 Type 2 diabetes mellitus with diabetic polyneuropathy: Secondary | ICD-10-CM | POA: Diagnosis not present

## 2023-12-12 DIAGNOSIS — Q828 Other specified congenital malformations of skin: Secondary | ICD-10-CM

## 2023-12-12 DIAGNOSIS — M79675 Pain in left toe(s): Secondary | ICD-10-CM

## 2023-12-12 DIAGNOSIS — M79674 Pain in right toe(s): Secondary | ICD-10-CM | POA: Diagnosis not present

## 2023-12-12 DIAGNOSIS — L603 Nail dystrophy: Secondary | ICD-10-CM | POA: Insufficient documentation

## 2023-12-12 DIAGNOSIS — B351 Tinea unguium: Secondary | ICD-10-CM | POA: Diagnosis not present

## 2023-12-12 NOTE — Progress Notes (Signed)
 Subjective:  Patient ID: David Mcdowell, male    DOB: [birth year],  MRN: [id number]  David Mcdowell presents to clinic today for at risk foot care with history of diabetic neuropathy and painful porokeratotic lesion(s) left foot and painful mycotic toenails that limit ambulation. Painful toenails interfere with ambulation. Aggravating factors include wearing enclosed shoe gear. Pain is relieved with periodic professional debridement. Painful porokeratotic lesions are aggravated when weightbearing with and without shoegear. Pain is relieved with periodic professional debridement.  He relates plantar scar lesion has been very tender. He walks daily for exercise. Chief Complaint  Patient presents with   Nail Problem    RFC. He has a callous on the left foot arch area. A1c 8.6   New problem(s): None.   PCP is Leonce Lucie PARAS, PA-C.  Allergies  Allergen Reactions   Acetaminophen  Other (See Comments)   Ibuprofen      Other Reaction(s): Unknown    Review of Systems: Negative except as noted in the HPI.  Objective: No changes noted in today's physical examination. There were no vitals filed for this visit. David Mcdowell is a pleasant 68 y.o. male in NAD. AAO x 3.  Vascular Examination: Capillary refill time immediate b/l. Palpable pedal pulses. Pedal hair present b/l. No pain with calf compression b/l. Skin temperature gradient WNL b/l. No cyanosis or clubbing b/l. No ischemia or gangrene noted b/l. No edema noted b/l LE.  Neurological Examination: Sensation grossly intact b/l with 10 gram monofilament. Vibratory sensation intact b/l. Pt has subjective symptoms of neuropathy.  Dermatological Examination: Porokeratotic lesion(s) submet head 5 left foot and plantar surgical scar plantar arch left foot No erythema, no edema, no drainage, no fluctuance.  Pedal skin with normal turgor, texture and tone b/l.  No open wounds. No interdigital macerations.   Spoon shaped mycotic toenails  1-5 b/l. There is tenderness to palpation. No erythema, no edema.  Musculoskeletal Examination: Muscle strength 5/5 to all lower extremity muscle groups bilaterally. No pain, crepitus or joint limitation noted with ROM b/l LE. No gross bony pedal deformities b/l. Patient ambulates independently without assistive aids.  Assessment/Plan: 1. Pain due to onychomycosis of toenails of both feet   2. Porokeratosis   3. Diabetic peripheral neuropathy associated with type 2 diabetes mellitus (HCC)   -Consent given for treatment as described below: -Examined patient. -Continue foot and shoe inspections daily. Monitor blood glucose per PCP/Endocrinologist's recommendations. -Patient to continue soft, supportive shoe gear daily. -Toenails 1-5 b/l were debrided in length and girth with sterile nail nippers and dremel without iatrogenic bleeding.  -Porokeratotic lesion(s) plantar scar medial arch left foot and submet head 5 left foot pared and enucleated with sterile currette. Pinpoint bleeding of arch area addressed with Lumicain Hemostatic Solution. Cleansed with alcohol. TAO and band-aid applied. Remove dressing on tomorrow. Total number of lesions debrided=2 -Patient/POA to call should there be question/concern in the interim.   Return in about 9 weeks (around 02/13/2024).  David Mcdowell, DPM      [place] [place] LOCATION: [address], [place] [address]                   Office [phone]   [place] LOCATION: [phone]  [facility] [place], [place] [address] Office [phone]

## 2023-12-13 ENCOUNTER — Ambulatory Visit: Admitting: Podiatry

## 2023-12-26 ENCOUNTER — Other Ambulatory Visit: Payer: Self-pay | Admitting: Cardiology

## 2024-02-13 ENCOUNTER — Ambulatory Visit: Admitting: Podiatry

## 2024-02-13 ENCOUNTER — Encounter: Payer: Self-pay | Admitting: Podiatry

## 2024-02-13 DIAGNOSIS — B351 Tinea unguium: Secondary | ICD-10-CM

## 2024-02-13 DIAGNOSIS — R234 Changes in skin texture: Secondary | ICD-10-CM

## 2024-02-13 DIAGNOSIS — Q828 Other specified congenital malformations of skin: Secondary | ICD-10-CM

## 2024-02-13 DIAGNOSIS — E1142 Type 2 diabetes mellitus with diabetic polyneuropathy: Secondary | ICD-10-CM

## 2024-02-13 DIAGNOSIS — L853 Xerosis cutis: Secondary | ICD-10-CM

## 2024-02-13 MED ORDER — AMMONIUM LACTATE 12 % EX LOTN: 1.0000 | TOPICAL_LOTION | CUTANEOUS | 5 refills | Status: AC | PRN

## 2024-02-13 NOTE — Progress Notes (Unsigned)
"  °  Subjective:  Patient ID: David Mcdowell, male    DOB: 1955-08-29,  MRN: [id number]  David MARLA Mcdowell presents to clinic today for at risk foot care with history of diabetic neuropathy and painful porokeratotic lesion(s) left lower extremity and painful mycotic toenails that limit ambulation. Painful toenails interfere with ambulation. Aggravating factors include wearing enclosed shoe gear. Pain is relieved with periodic professional debridement. Painful porokeratotic lesions are aggravated when weightbearing with and without shoegear. Pain is relieved with periodic professional debridement.  Chief Complaint  Patient presents with   Diabetes    A1c 7.0. He saw PA David on 01/22/24   New problem(s): None. {jgcomplaint:23593}  PCP is David Mcdowell PARAS, PA-C.  Allergies[1]  Review of Systems: Negative except as noted in the HPI.  Objective: No changes noted in today's physical examination. There were no vitals filed for this visit. David Mcdowell is a pleasant 69 y.o. male {jgbodyhabitus:24098} AAO x 3.  Vascular Examination: Capillary refill time immediate b/l. Palpable pedal pulses. Pedal hair present b/l. No pain with calf compression b/l. Skin temperature gradient WNL b/l. No cyanosis or clubbing b/l. No ischemia or gangrene noted b/l. No edema noted b/l LE.  Neurological Examination: Sensation grossly intact b/l with 10 gram monofilament. Vibratory sensation intact b/l. Pt has subjective symptoms of neuropathy.  Dermatological Examination: Porokeratotic lesion(s) submet head 5 left foot and plantar surgical scar plantar arch left foot No erythema, no edema, no drainage, no fluctuance.  Pedal skin with normal turgor, texture and tone b/l.  No open wounds. No interdigital macerations.   Spoon shaped mycotic toenails 1-5 b/l. There is tenderness to palpation. No erythema, no edema.  Musculoskeletal Examination: Muscle strength 5/5 to all lower extremity muscle groups  bilaterally. No pain, crepitus or joint limitation noted with ROM b/l LE. No gross bony pedal deformities b/l. Patient ambulates independently without assistive aids.  Assessment/Plan: 1. Pain due to onychomycosis of toenails of both feet   2. Porokeratosis   3. Diabetic peripheral neuropathy associated with type 2 diabetes mellitus (HCC)     No orders of the defined types were placed in this encounter.   None Patient was evaluated and treated. All patient's and/or POA's questions/concerns addressed on today's visit. Mycotic toenails 1-5 b/l debrided in length and girth without incident. Porokeratotic lesion(s) {jgPodToeLocator:23637} pared with sharp debridement without incident. Continue daily foot inspections and monitor blood glucose per PCP/Endocrinologist's recommendations. Continue soft, supportive shoe gear daily. Report any pedal injuries to medical professional. Call office if there are any questions/concerns. -Patient/POA to call should there be question/concern in the interim.   Return in about 9 weeks (around 04/16/2024).  David Mcdowell, DPM      [place] LOCATION: [address] [place] [address]                   Office [phone]   [place] LOCATION: [address] [place], [place] [address] Office [phone]     [1]  Allergies Allergen Reactions   Acetaminophen  Other (See Comments)   Ibuprofen      Other Reaction(s): Unknown   "

## 2024-04-16 ENCOUNTER — Ambulatory Visit: Admitting: Podiatry

## 2024-04-20 ENCOUNTER — Ambulatory Visit: Admitting: Podiatry
